# Patient Record
Sex: Female | Born: 1970 | Race: Black or African American | Hispanic: No | Marital: Married | Smoking: Former smoker
Health system: Southern US, Community
[De-identification: ages and names within clinical notes are randomized; demographics above are authoritative.]

## PROBLEM LIST (undated history)

## (undated) DIAGNOSIS — M21379 Foot drop, unspecified foot: Secondary | ICD-10-CM

## (undated) DIAGNOSIS — N83209 Unspecified ovarian cyst, unspecified side: Secondary | ICD-10-CM

## (undated) HISTORY — DX: Foot drop, unspecified foot: M21.379

## (undated) HISTORY — PX: MINOR CARPAL TUNNEL: SHX6472

## (undated) HISTORY — PX: KNEE SURGERY: SHX244

---

## 1998-09-11 ENCOUNTER — Other Ambulatory Visit: Admission: RE | Admit: 1998-09-11 | Discharge: 1998-09-11 | Payer: Self-pay | Admitting: *Deleted

## 1999-08-16 ENCOUNTER — Emergency Department (HOSPITAL_COMMUNITY): Admission: EM | Admit: 1999-08-16 | Discharge: 1999-08-16 | Payer: Self-pay | Admitting: Emergency Medicine

## 1999-08-16 ENCOUNTER — Encounter: Payer: Self-pay | Admitting: Emergency Medicine

## 1999-08-27 ENCOUNTER — Emergency Department (HOSPITAL_COMMUNITY): Admission: EM | Admit: 1999-08-27 | Discharge: 1999-08-28 | Payer: Self-pay | Admitting: Emergency Medicine

## 1999-09-01 ENCOUNTER — Emergency Department (HOSPITAL_COMMUNITY): Admission: EM | Admit: 1999-09-01 | Discharge: 1999-09-01 | Payer: Self-pay | Admitting: Emergency Medicine

## 2000-01-26 ENCOUNTER — Other Ambulatory Visit: Admission: RE | Admit: 2000-01-26 | Discharge: 2000-01-26 | Payer: Self-pay | Admitting: *Deleted

## 2003-02-07 ENCOUNTER — Other Ambulatory Visit: Admission: RE | Admit: 2003-02-07 | Discharge: 2003-02-07 | Payer: Self-pay | Admitting: *Deleted

## 2003-02-07 ENCOUNTER — Other Ambulatory Visit: Admission: RE | Admit: 2003-02-07 | Discharge: 2003-02-07 | Payer: Self-pay | Admitting: Family Medicine

## 2003-07-15 ENCOUNTER — Encounter: Admission: RE | Admit: 2003-07-15 | Discharge: 2003-09-16 | Payer: Self-pay | Admitting: Family Medicine

## 2003-07-22 ENCOUNTER — Encounter: Admission: RE | Admit: 2003-07-22 | Discharge: 2003-10-20 | Payer: Self-pay | Admitting: Family Medicine

## 2003-09-20 ENCOUNTER — Encounter: Admission: RE | Admit: 2003-09-20 | Discharge: 2003-09-20 | Payer: Self-pay | Admitting: Internal Medicine

## 2003-10-15 ENCOUNTER — Other Ambulatory Visit: Admission: RE | Admit: 2003-10-15 | Discharge: 2003-10-15 | Payer: Self-pay | Admitting: Obstetrics and Gynecology

## 2004-03-10 ENCOUNTER — Inpatient Hospital Stay (HOSPITAL_COMMUNITY): Admission: AD | Admit: 2004-03-10 | Discharge: 2004-03-10 | Payer: Self-pay | Admitting: Obstetrics and Gynecology

## 2004-04-22 ENCOUNTER — Inpatient Hospital Stay (HOSPITAL_COMMUNITY): Admission: AD | Admit: 2004-04-22 | Discharge: 2004-04-26 | Payer: Self-pay | Admitting: Obstetrics and Gynecology

## 2004-04-22 ENCOUNTER — Encounter (INDEPENDENT_AMBULATORY_CARE_PROVIDER_SITE_OTHER): Payer: Self-pay | Admitting: *Deleted

## 2004-04-27 ENCOUNTER — Encounter: Admission: RE | Admit: 2004-04-27 | Discharge: 2004-05-27 | Payer: Self-pay | Admitting: Obstetrics and Gynecology

## 2004-05-28 ENCOUNTER — Encounter: Admission: RE | Admit: 2004-05-28 | Discharge: 2004-06-27 | Payer: Self-pay | Admitting: Obstetrics and Gynecology

## 2004-07-28 ENCOUNTER — Encounter: Admission: RE | Admit: 2004-07-28 | Discharge: 2004-08-27 | Payer: Self-pay | Admitting: Obstetrics and Gynecology

## 2005-02-11 ENCOUNTER — Other Ambulatory Visit: Admission: RE | Admit: 2005-02-11 | Discharge: 2005-02-11 | Payer: Self-pay | Admitting: Family Medicine

## 2005-10-01 ENCOUNTER — Ambulatory Visit: Payer: Self-pay | Admitting: Oncology

## 2006-03-14 ENCOUNTER — Other Ambulatory Visit: Admission: RE | Admit: 2006-03-14 | Discharge: 2006-03-14 | Payer: Self-pay | Admitting: Obstetrics and Gynecology

## 2007-03-17 ENCOUNTER — Ambulatory Visit (HOSPITAL_COMMUNITY): Admission: RE | Admit: 2007-03-17 | Discharge: 2007-03-17 | Payer: Self-pay | Admitting: Obstetrics and Gynecology

## 2007-03-17 IMAGING — US US FETAL BPP W/O NONSTRESS
2 series · 14 of 26 positions shown · non-contrast
Comparison: none

OBSTETRICAL ULTRASOUND:

 This ultrasound exam was performed in the [HOSPITAL] Ultrasound Department.  The OB US report was generated in the AS system, and faxed to the ordering physician.  This report is also available in [REDACTED] PACS.

[Series 1: us fetal bpp w/o nonstress · non-contrast · 12 of 23 slices shown (1 of 2)]
[im 1/23]
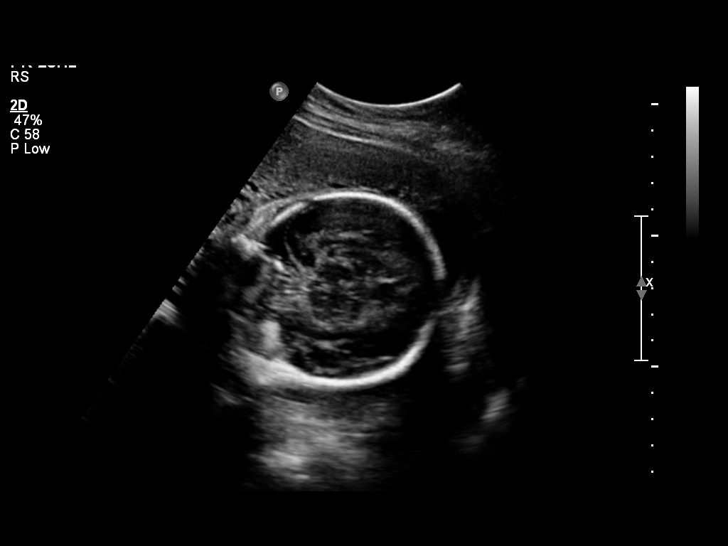
[im 3/23]
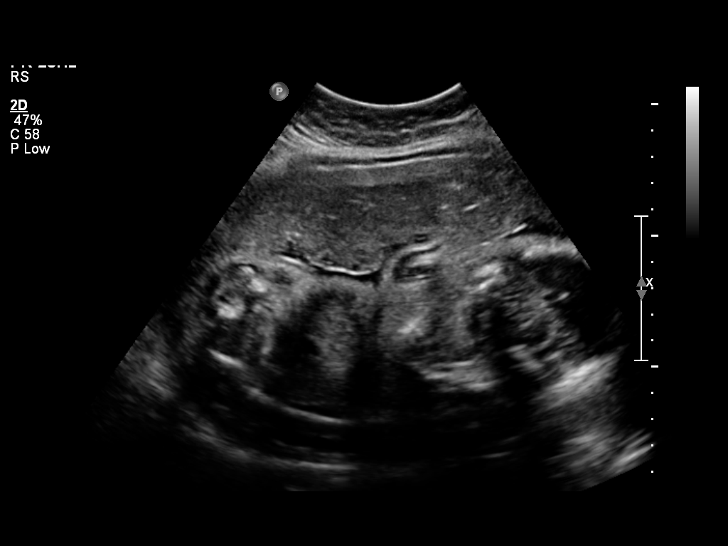
[im 5/23]
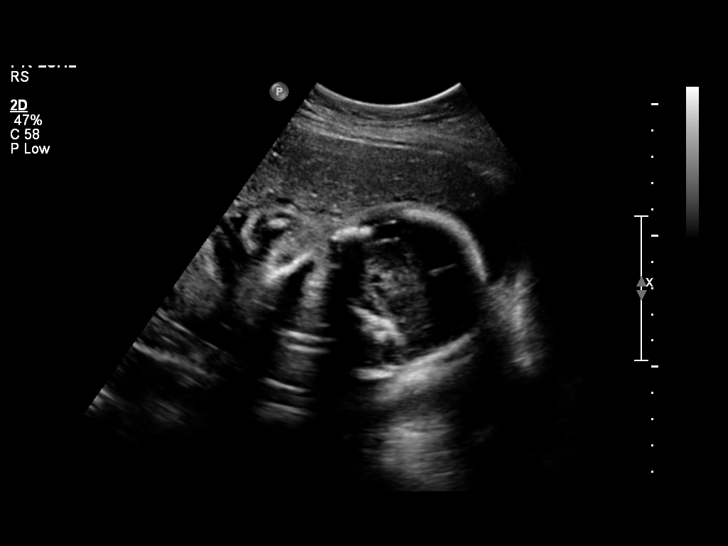
[im 7/23]
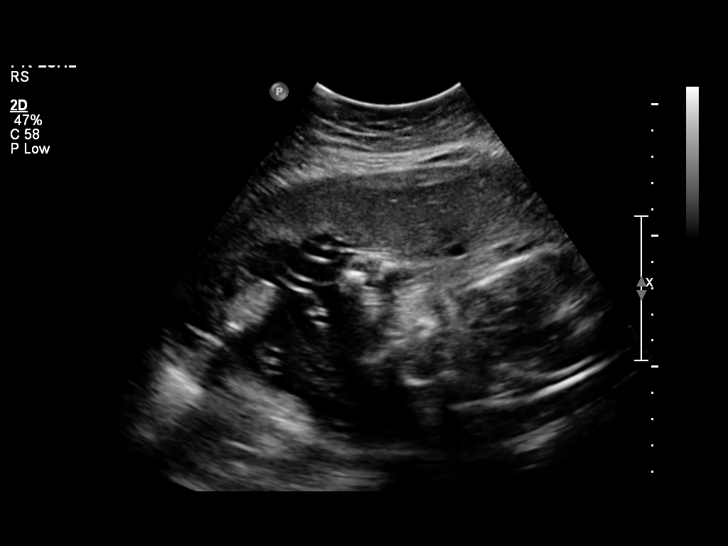
[im 9/23]
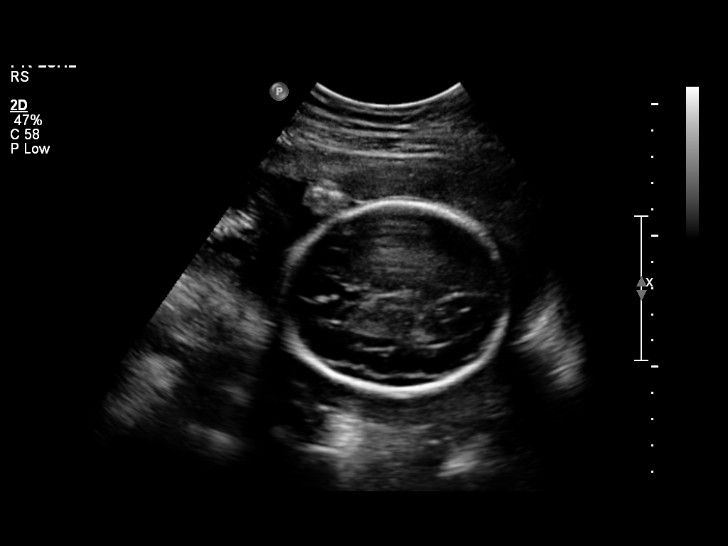
[im 11/23]
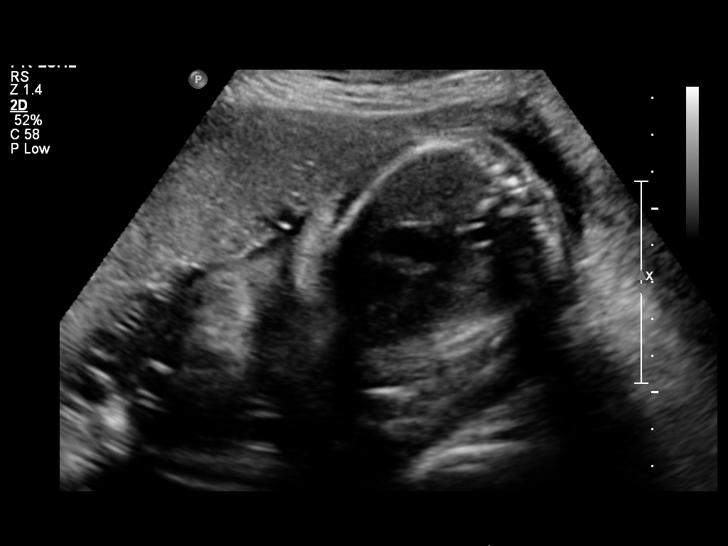
[im 13/23]
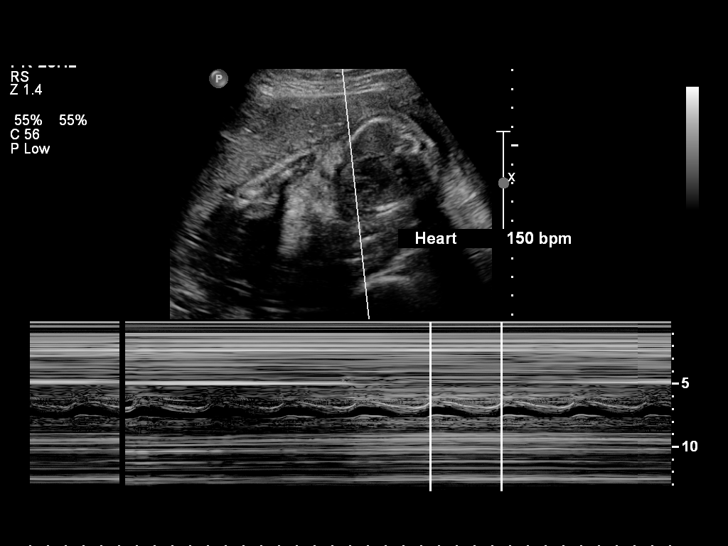
[im 14/23]
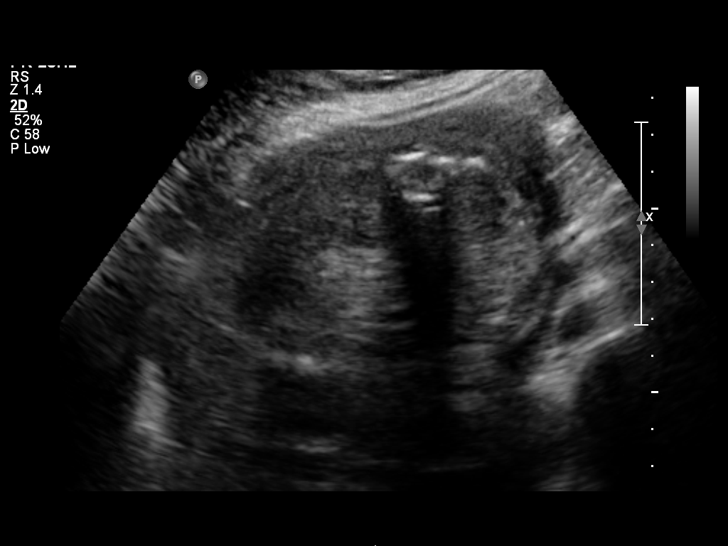
[im 16/23]
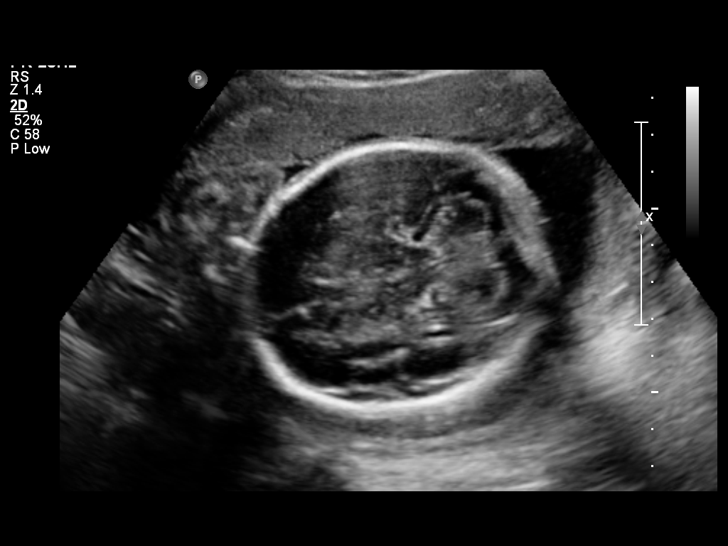
[im 18/23]
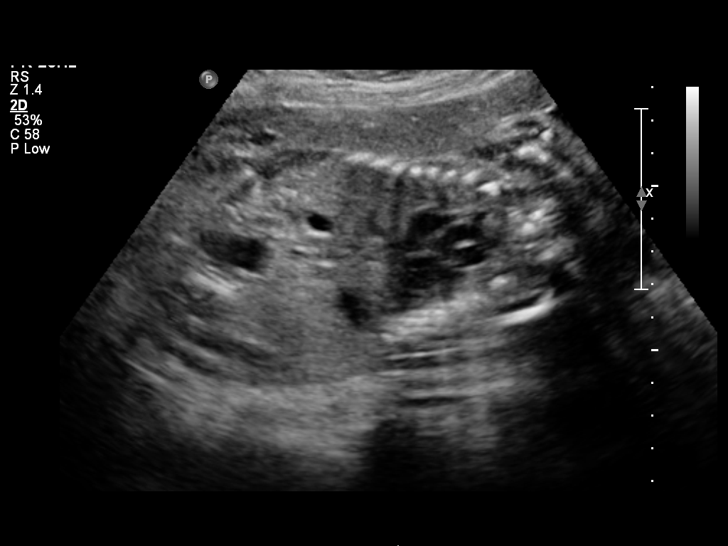
[im 20/23]
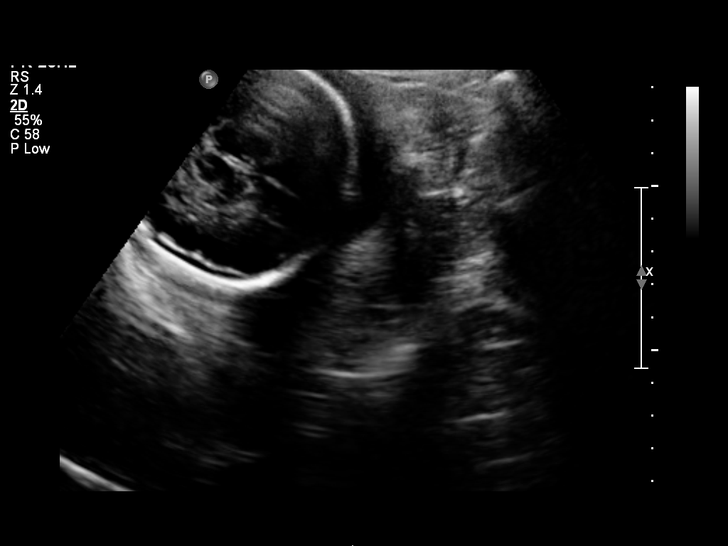
[im 22/23]
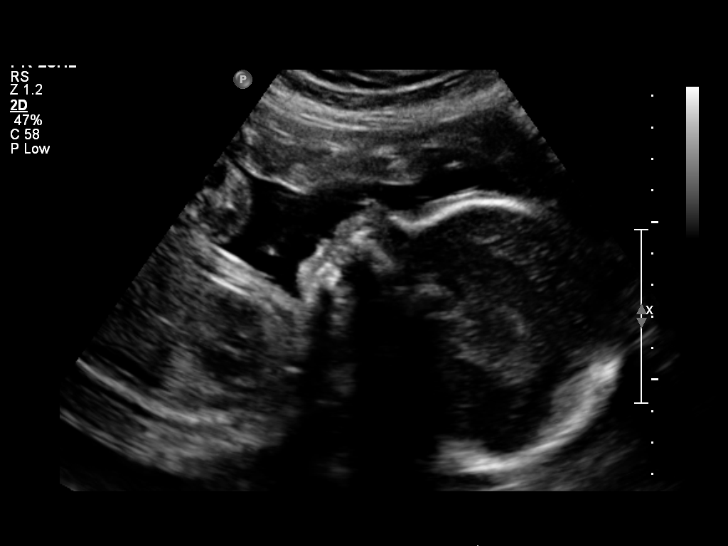

[Series 1: us fetal bpp w/o nonstress · non-contrast · 2 of 3 slices shown (2 of 2)]
[im 1/3]
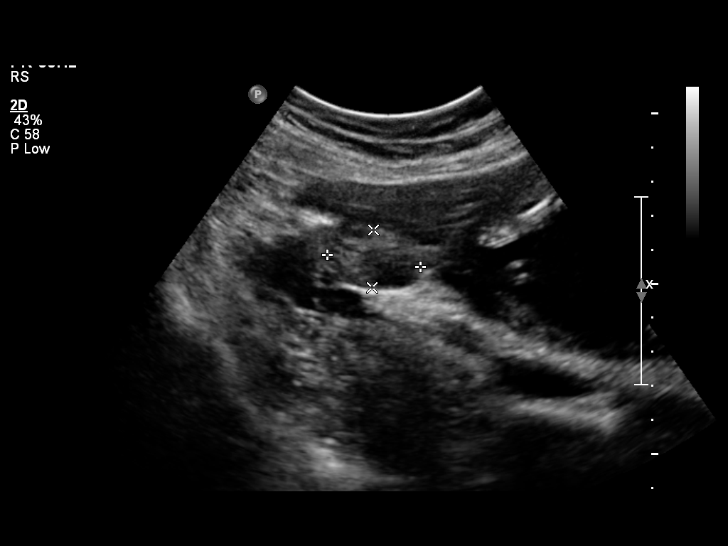
[im 3/3]
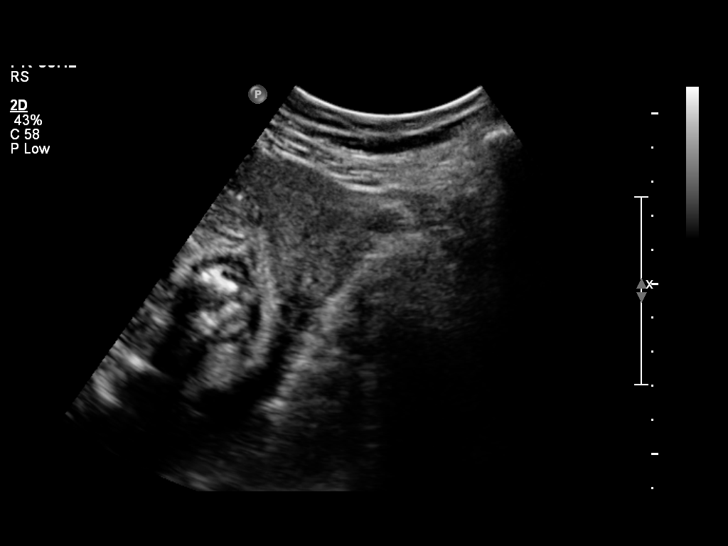

[14 of 26 positions shown; findings below may reference images not displayed]

IMPRESSION: See AS Obstetric US report.

## 2007-05-15 ENCOUNTER — Inpatient Hospital Stay (HOSPITAL_COMMUNITY): Admission: AD | Admit: 2007-05-15 | Discharge: 2007-05-16 | Payer: Self-pay | Admitting: Obstetrics and Gynecology

## 2007-05-16 IMAGING — US US OB LIMITED
1 series · 14 of 21 positions shown · non-contrast
Comparison: none

OBSTETRICAL ULTRASOUND:
 This ultrasound exam was performed in the [HOSPITAL] Ultrasound Department.  The OB US report was generated in the AS system, and faxed to the ordering physician.  This report is also available in [REDACTED] PACS.

[Series 1: us ob limited · 0.31mm/px · 21 acquisitions, 14 frames shown]
[im 1/21]
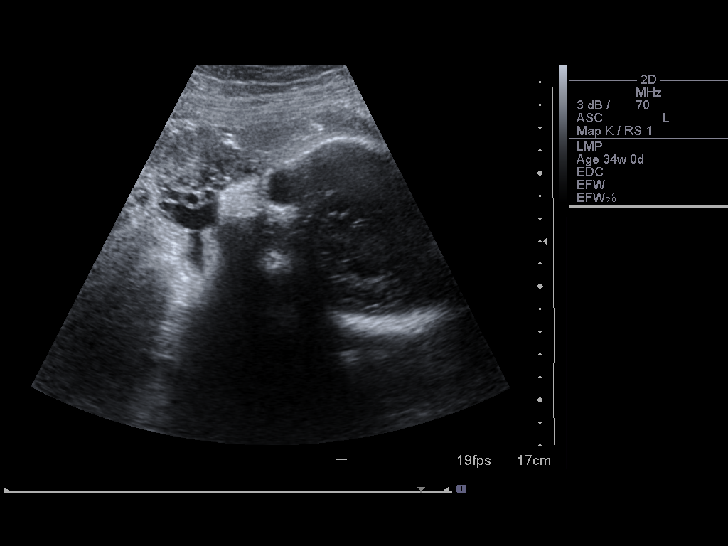
[im 3/21]
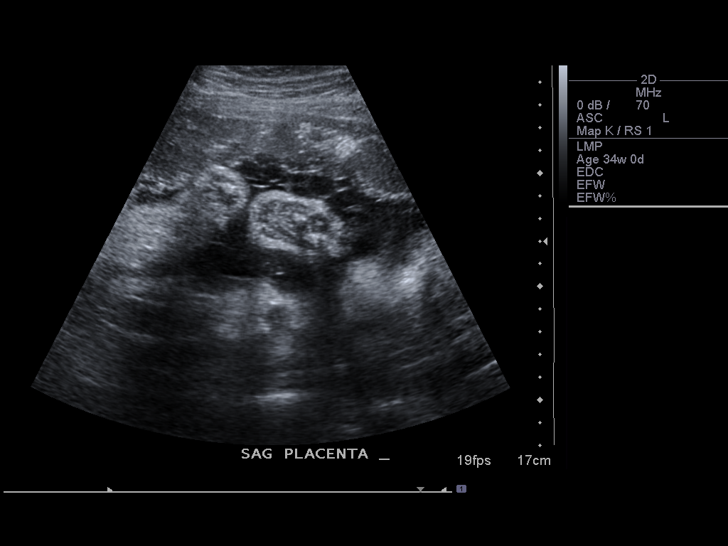
[im 4/21]
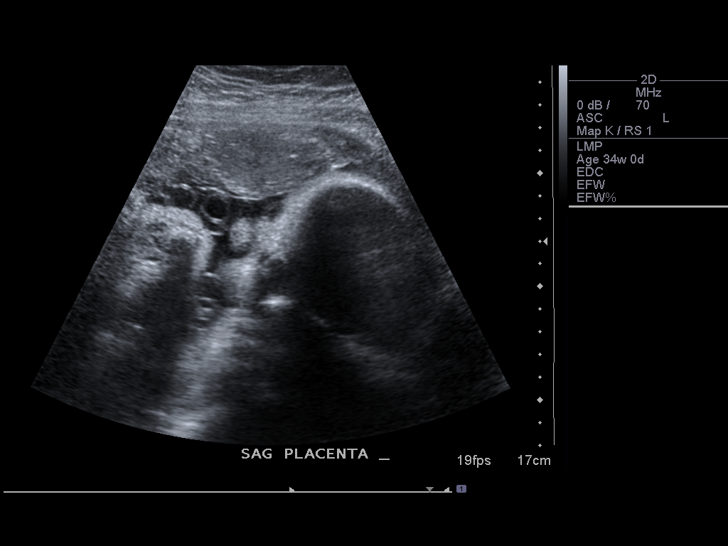
[im 6/21]
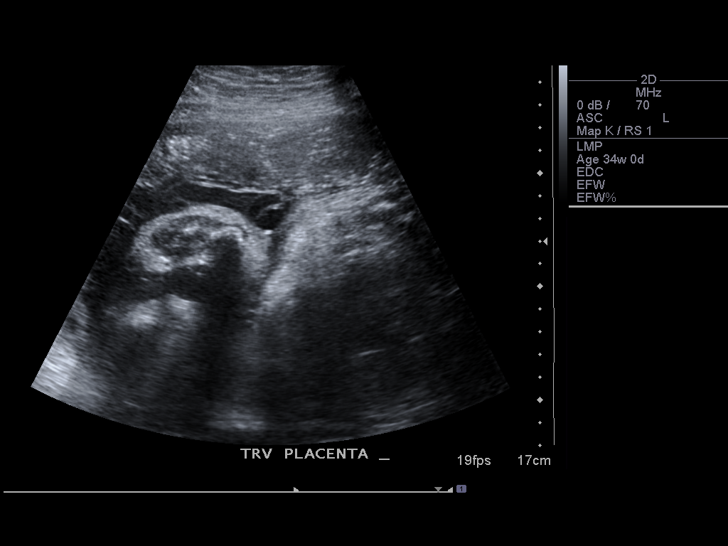
[im 7/21]
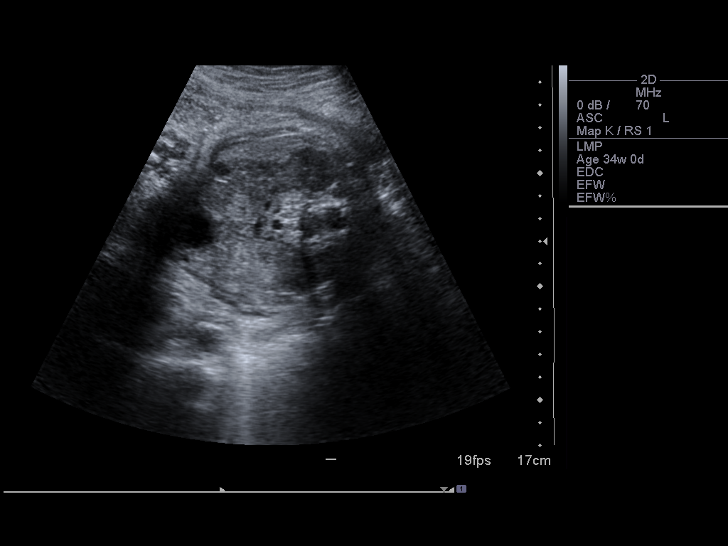
[im 9/21]
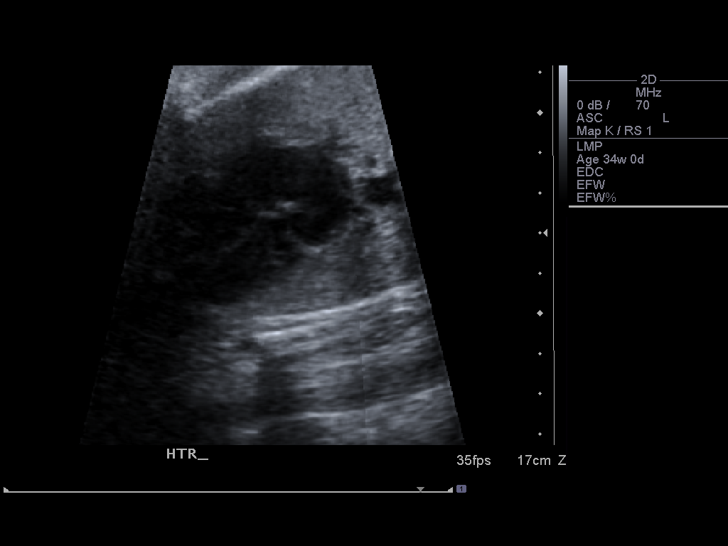
[im 10/21]
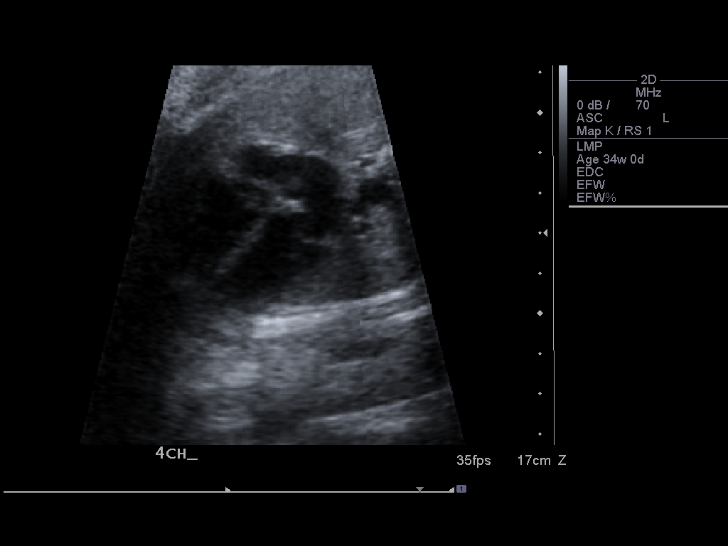
[im 12/21]
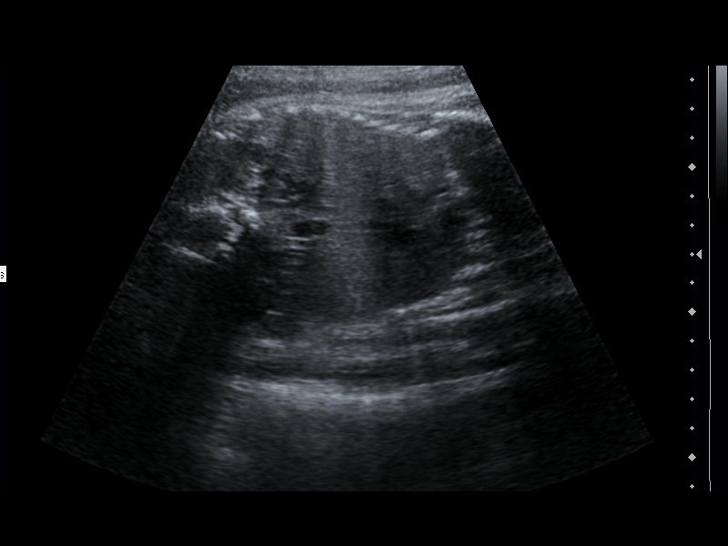
[im 13/21]
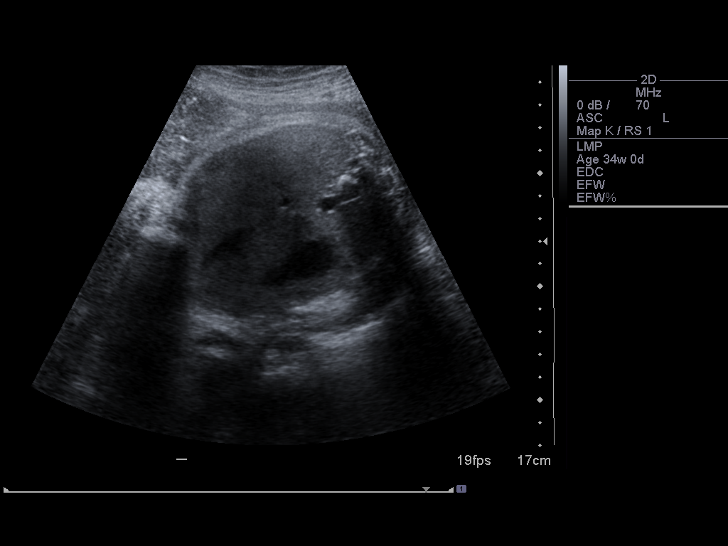
[im 15/21]
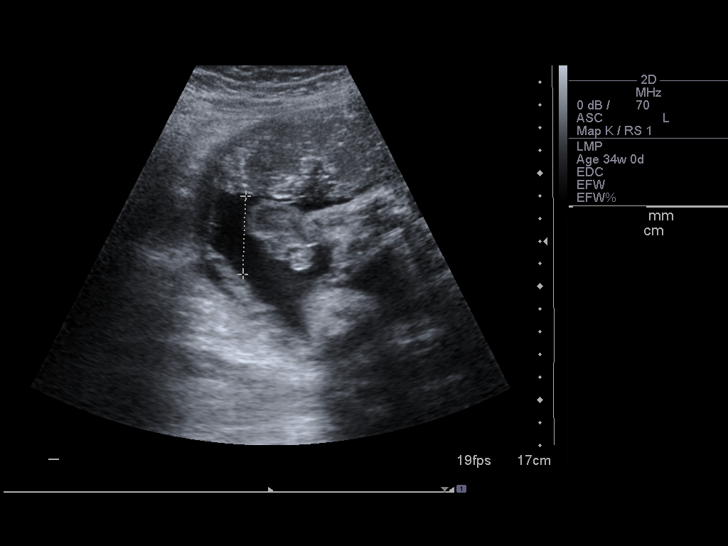
[im 16/21]
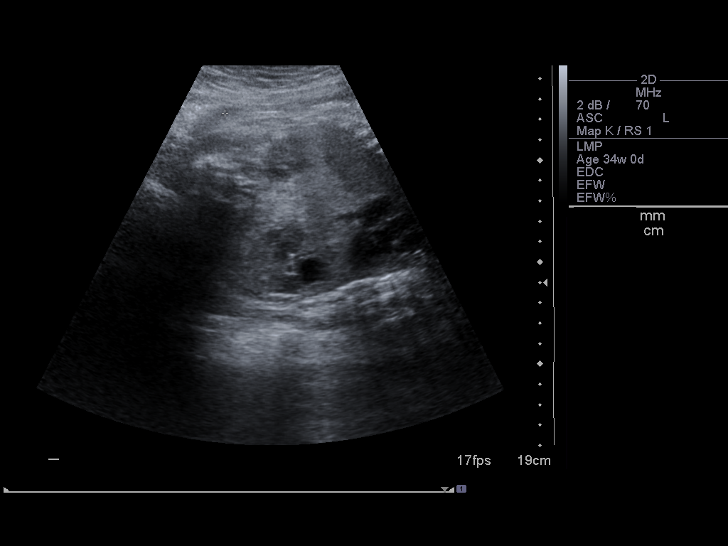
[im 18/21]
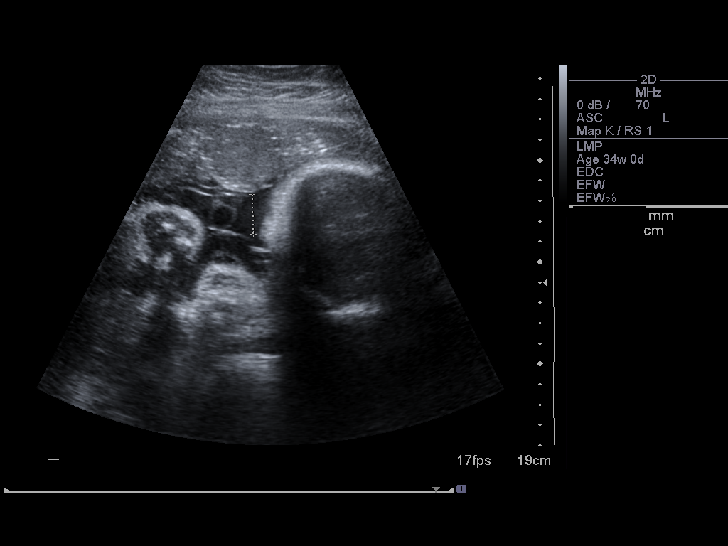
[im 19/21]
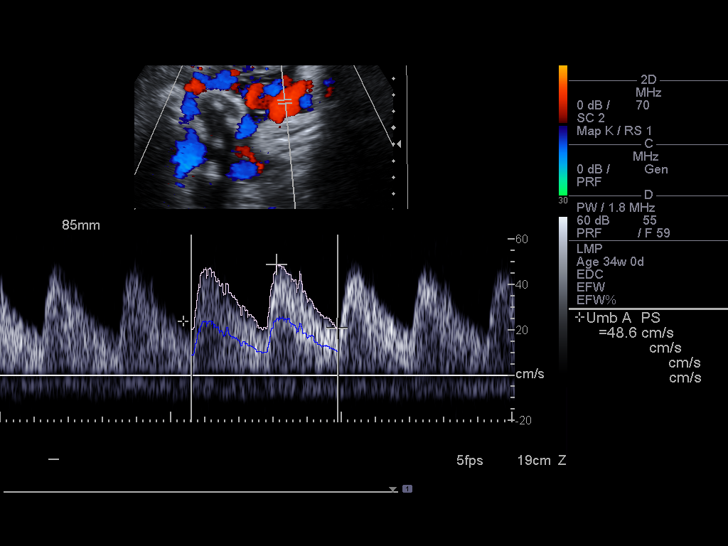
[im 21/21]
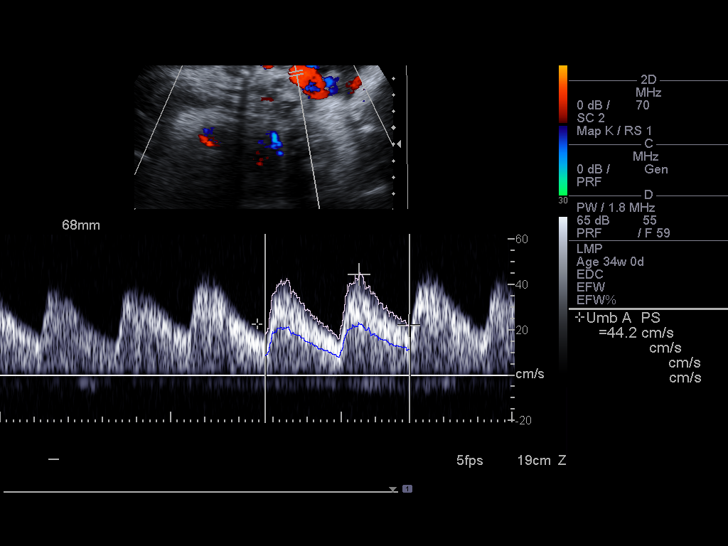

[14 of 21 positions shown; findings below may reference images not displayed]

IMPRESSION: See AS Obstetric US report.

## 2007-05-19 ENCOUNTER — Ambulatory Visit (HOSPITAL_COMMUNITY): Admission: RE | Admit: 2007-05-19 | Discharge: 2007-05-19 | Payer: Self-pay | Admitting: Obstetrics and Gynecology

## 2007-05-19 IMAGING — US US OB LIMITED
1 series · 14 of 26 positions shown · non-contrast
Comparison: none

OBSTETRICAL ULTRASOUND:
 This ultrasound exam was performed in the [HOSPITAL] Ultrasound Department.  The OB US report was generated in the AS system, and faxed to the ordering physician.  This report is also available in [REDACTED] PACS.

[Series 1: us ob limited · 0.30mm/px · 26 acquisitions, 14 frames shown]
[im 1/26]
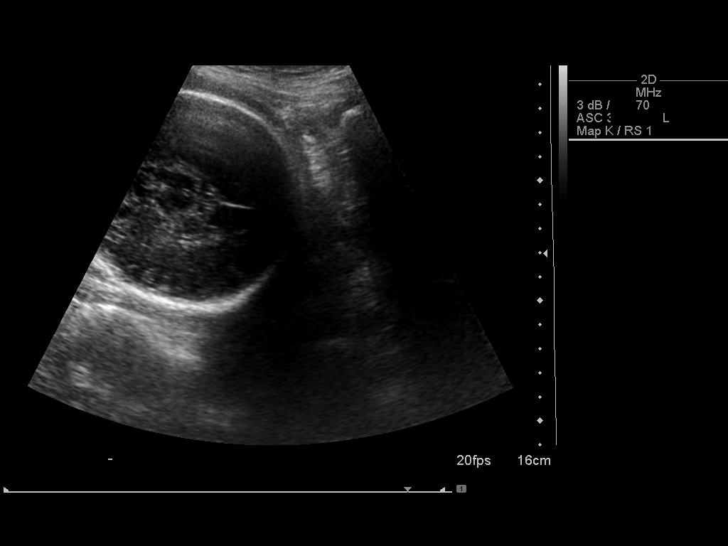
[im 3/26]
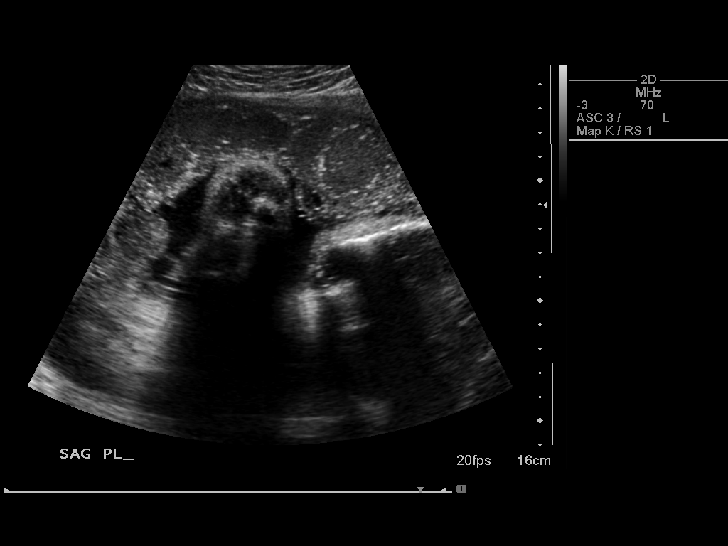
[im 5/26]
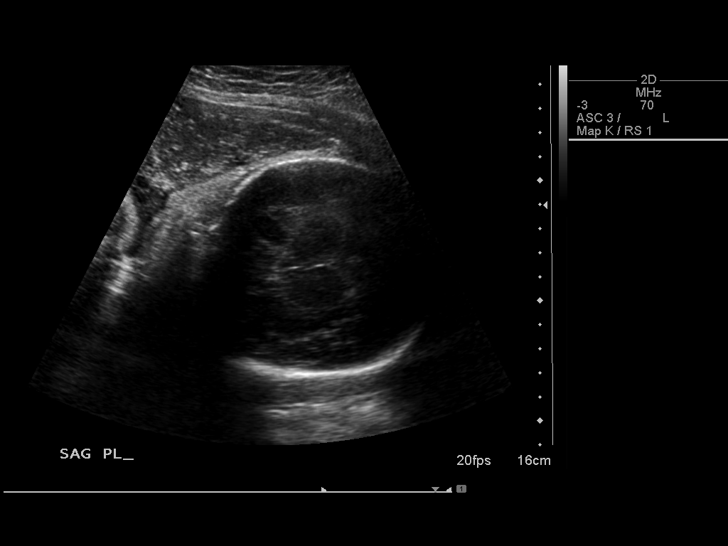
[im 7/26]
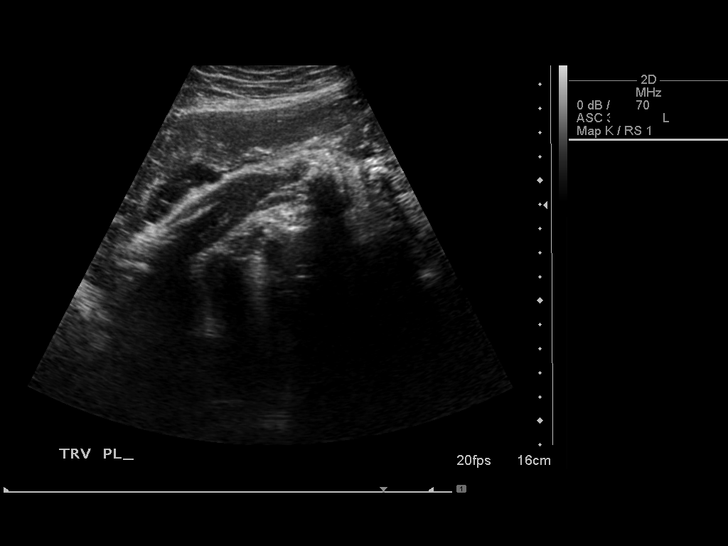
[im 9/26]
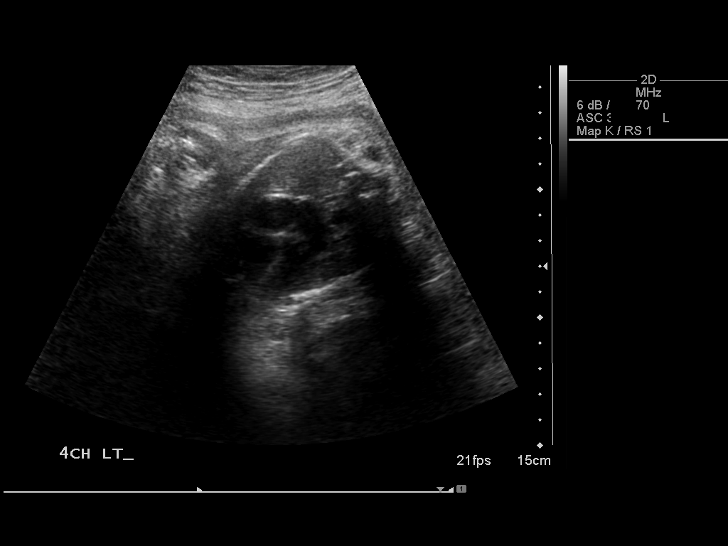
[im 11/26]
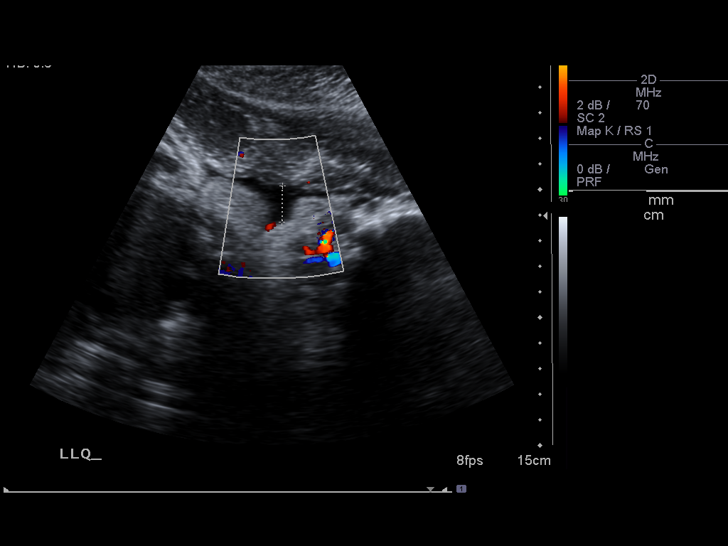
[im 13/26]
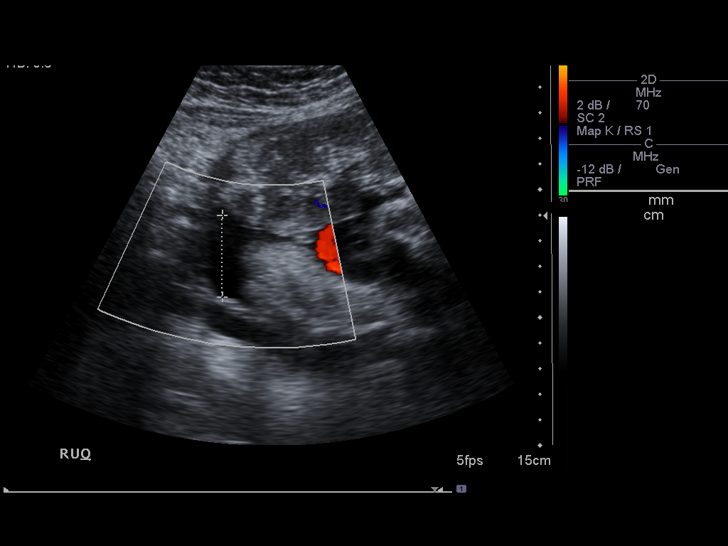
[im 14/26]
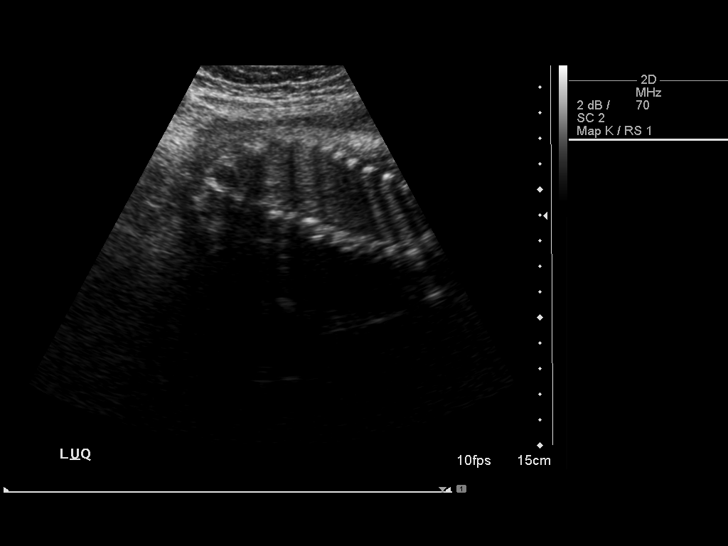
[im 16/26]
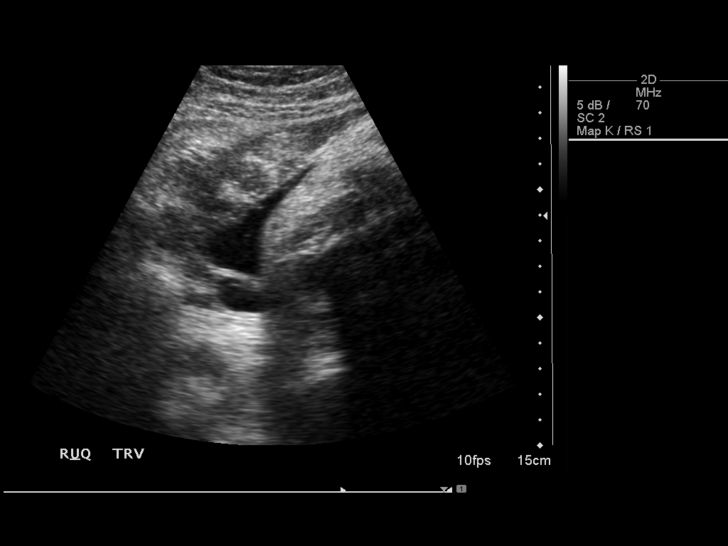
[im 18/26]
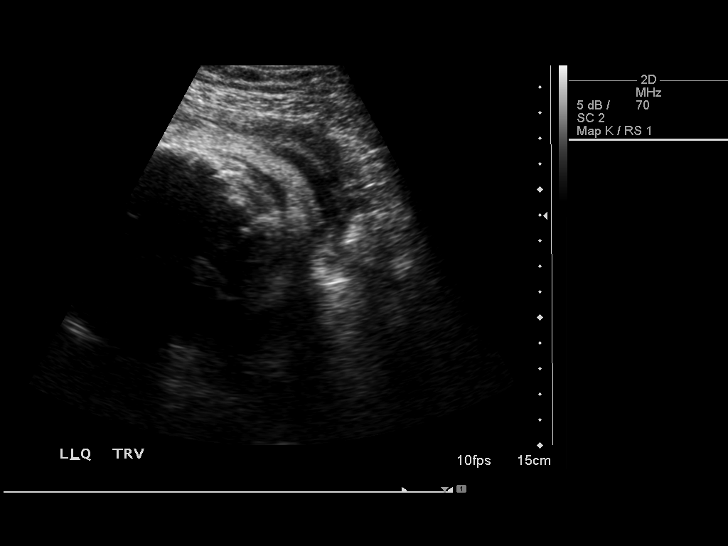
[im 20/26]
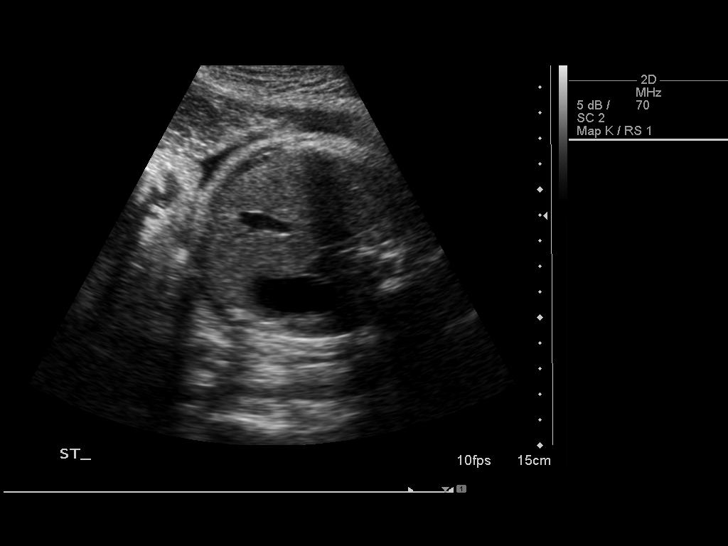
[im 22/26]
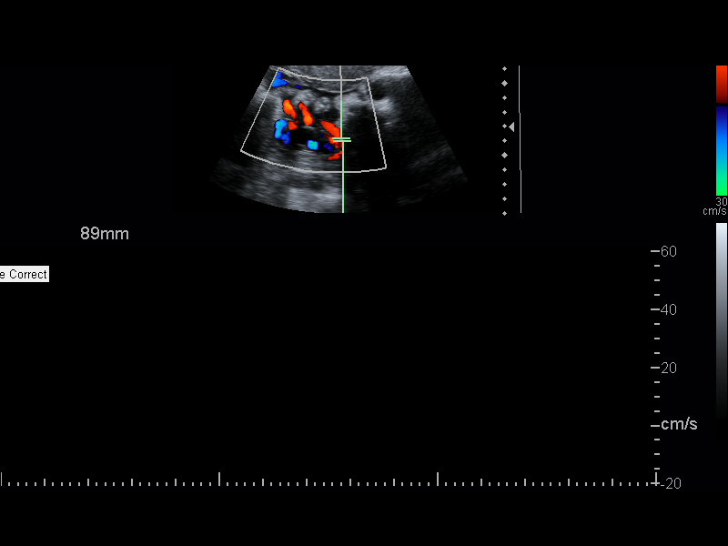
[im 24/26]
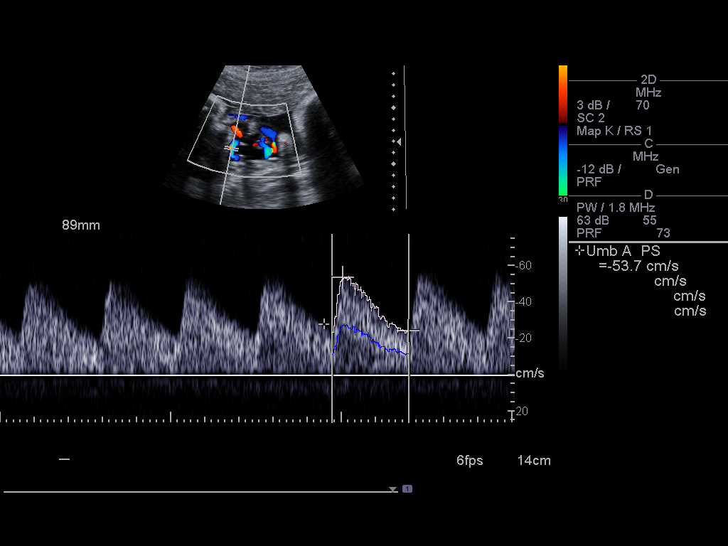
[im 26/26]
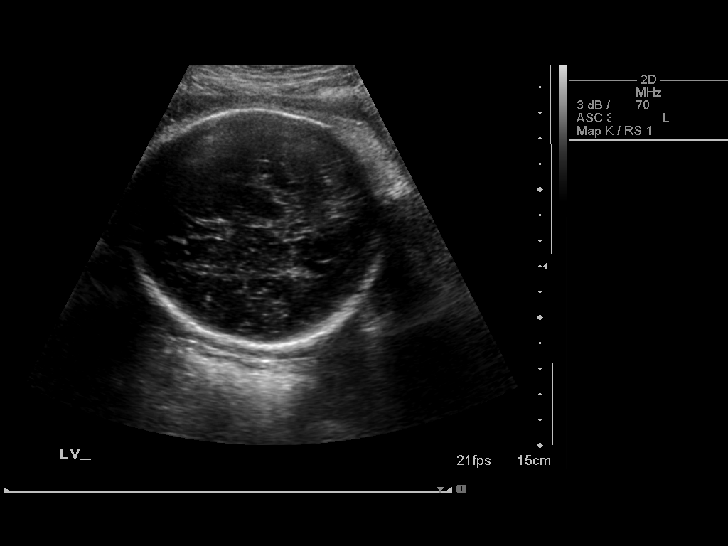

[14 of 26 positions shown; findings below may reference images not displayed]

IMPRESSION: See AS Obstetric US report.

## 2007-05-23 ENCOUNTER — Inpatient Hospital Stay (HOSPITAL_COMMUNITY): Admission: AD | Admit: 2007-05-23 | Discharge: 2007-05-26 | Payer: Self-pay | Admitting: Obstetrics and Gynecology

## 2007-05-27 ENCOUNTER — Encounter: Admission: RE | Admit: 2007-05-27 | Discharge: 2007-06-26 | Payer: Self-pay | Admitting: Obstetrics and Gynecology

## 2007-09-20 ENCOUNTER — Other Ambulatory Visit: Admission: RE | Admit: 2007-09-20 | Discharge: 2007-09-20 | Payer: Self-pay | Admitting: Obstetrics and Gynecology

## 2009-02-11 ENCOUNTER — Other Ambulatory Visit: Admission: RE | Admit: 2009-02-11 | Discharge: 2009-02-11 | Payer: Self-pay | Admitting: Obstetrics and Gynecology

## 2009-04-10 ENCOUNTER — Inpatient Hospital Stay (HOSPITAL_COMMUNITY): Admission: AD | Admit: 2009-04-10 | Discharge: 2009-04-11 | Payer: Self-pay | Admitting: Obstetrics and Gynecology

## 2009-11-01 ENCOUNTER — Inpatient Hospital Stay (HOSPITAL_COMMUNITY): Admission: AD | Admit: 2009-11-01 | Discharge: 2009-11-03 | Payer: Self-pay | Admitting: Obstetrics and Gynecology

## 2010-03-01 ENCOUNTER — Encounter: Payer: Self-pay | Admitting: Obstetrics and Gynecology

## 2010-04-23 LAB — CBC
HCT: 30.6 % — ABNORMAL LOW (ref 36.0–46.0)
Hemoglobin: 10.5 g/dL — ABNORMAL LOW (ref 12.0–15.0)
Hemoglobin: 11.2 g/dL — ABNORMAL LOW (ref 12.0–15.0)
MCH: 32.1 pg (ref 26.0–34.0)
MCH: 32.5 pg (ref 26.0–34.0)
MCHC: 34.4 g/dL (ref 30.0–36.0)
MCV: 94.3 fL (ref 78.0–100.0)
Platelets: 262 10*3/uL (ref 150–400)
RBC: 3.5 MIL/uL — ABNORMAL LOW (ref 3.87–5.11)

## 2010-05-03 LAB — CBC
HCT: 32.5 % — ABNORMAL LOW (ref 36.0–46.0)
Hemoglobin: 11.2 g/dL — ABNORMAL LOW (ref 12.0–15.0)
MCHC: 34.4 g/dL (ref 30.0–36.0)
MCV: 91 fL (ref 78.0–100.0)
Platelets: 334 10*3/uL (ref 150–400)
RBC: 3.57 MIL/uL — ABNORMAL LOW (ref 3.87–5.11)
RDW: 11.7 % (ref 11.5–15.5)
WBC: 5.9 10*3/uL (ref 4.0–10.5)

## 2010-05-03 LAB — COMPREHENSIVE METABOLIC PANEL
ALT: 14 U/L (ref 0–35)
AST: 13 U/L (ref 0–37)
Albumin: 3.2 g/dL — ABNORMAL LOW (ref 3.5–5.2)
Alkaline Phosphatase: 51 U/L (ref 39–117)
BUN: 2 mg/dL — ABNORMAL LOW (ref 6–23)
CO2: 25 mEq/L (ref 19–32)
Calcium: 9.1 mg/dL (ref 8.4–10.5)
Chloride: 100 mEq/L (ref 96–112)
Creatinine, Ser: 0.41 mg/dL (ref 0.4–1.2)
GFR calc Af Amer: >60 mL/min (ref >60)
GFR calc non Af Amer: >60 mL/min (ref >60)
Glucose, Bld: 84 mg/dL (ref 70–99)
Potassium: 3.5 mEq/L (ref 3.5–5.1)
Sodium: 132 mEq/L — ABNORMAL LOW (ref 135–145)
Total Bilirubin: 0.1 mg/dL — ABNORMAL LOW (ref 0.3–1.2)
Total Protein: 6.6 g/dL (ref 6.0–8.3)

## 2010-05-03 LAB — URINALYSIS, ROUTINE W REFLEX MICROSCOPIC
Bilirubin Urine: NEGATIVE
Glucose, UA: NEGATIVE mg/dL
Hgb urine dipstick: NEGATIVE
Ketones, ur: NEGATIVE mg/dL
Nitrite: NEGATIVE
Protein, ur: NEGATIVE mg/dL
Specific Gravity, Urine: 1.02 (ref 1.005–1.030)
Urobilinogen, UA: 0.2 mg/dL (ref 0.0–1.0)
pH: 6 (ref 5.0–8.0)

## 2010-06-23 NOTE — Discharge Summary (Signed)
Lauren Levy, Lauren Levy                ACCOUNT NO.:  [id number]   MEDICAL RECORD NO.:  [id number]          PATIENT TYPE:  INP   LOCATION:  9162                          FACILITY:  [facility]   PHYSICIAN:  Charles A. Delcambre, MDDATE OF BIRTH:  February 18, 1970   DATE OF ADMISSION:  05/23/2007  DATE OF DISCHARGE:                               DISCHARGE SUMMARY   A 40 year old gravida 3, para1-0-1-1 at 88 weeks and 3 days, EDC on Jun 17, 2007, based on LMP consistent with a 19-week ultrasound.   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 and q.i.d. at this  pregnancy with resolution of ptyalism.  She, otherwise, has had a  routine prenatal course aside from a close vigilant watch for term,  although developing at 36-37 weeks.  She did develop this and was  induced last pregnancy with a successful vaginal birth.   PAST MEDICAL HISTORY:  Ptyalism.   MEDICATIONS:  1. Prenatal vitamins.  2. Iron.  3. Thorazine 25 mg t.i.d. and q.i.d.  4. Tandem OB.  5. Reglan 10 mg t.i.d.  6. Iron once a day.   PAST SURGICAL HISTORY:  Wrist surgery, vaginal birth x1, and spontaneous  abortion without D&C.   ALLERGIES:  No known drug allergies.  No latex allergy.   SOCIAL HISTORY:  No tobacco or ethanol or drugs.  She is married,  monogamous relationship with her husband.   FAMILY HISTORY:  Noncontributory.   LABORATORY DATA:  A 1-hour glucola 94, refused quad screen, refused  first trimester screen.  Hemoglobin 10.7 at 28 weeks.  The patient  refused RPR and HIV and had not gotten group B strep done yet.  She had  not gotten group B strep, but it was done at the hospital upon last  admission last week.  Cystic fibrosis negative.  HIV negative.  Hepatitis B surface antigen negative.  Rubella immune, VDRL nonreactive.  Sickle trait negative.  Antibody screen negative.  Type AB positive.  TSH was normal.  Pap was negative.  GC and Chlamydia  were negative.   PHYSICAL EXAMINATION:  GENERAL:  Alert and oriented x3, in no distress.  VITAL SIGNS:  Blood pressure 120/70, weight 176 pounds, heart rate 80,  and respirations 20.  HEENT EXAM:  Grossly within normal limits.  NECK:  Supple without thyromegaly or adenopathy.  LUNGS:  Clear bilaterally.  HEART:  Regular rate and rhythm without murmur, rub, or gallop.  BREASTS:  No masses, discharge, tenderness, skin or nipple changes  bilaterally.  ABDOMEN:  Gravid.  Fundal height 35 cm.  Fetal heart rate 140s.  Ultrasound today 5.5-cm oligohydramnios down from 6.3 to 6.1 to 6.0, now  5.5 cm, no evidence of ruptured membranes.  CERVIX:  Posterior 1.5 cm, 50% effaced, -3 station, ballotable, intact.  EXTREMITIES:  Minimal edema bilaterally.   ASSESSMENT:  1. Intrauterine pregnancy at 36 weeks and 3 days today,      oligohydramnios at 5.5 cm, AFI drifting down from 6.2 cm last week.  2. Advanced maternal age.  3. Ptyalism.  4. Anemia.   PLAN:  Admit for Cytotec 25 mcg q.4 h.  If unable to give Cytotec, I  will change to Pitocin 2-4 mL milliIU/minute overnight and then increase  per protocol in the morning.  If she is not in labor tonight or in the  morning, go ahead and allow her to eat.  I will get group B strep result  from the hospital onto chart; otherwise, than locating it, we will treat  as positive with penicillin.  She agrees with plan and we will proceed  as outlined.      Charles A. Sydnee Cabal, MD  Electronically Signed     CAD/MEDQ  D:  05/23/2007  T:  05/24/2007  Job:  [id number]

## 2010-06-26 NOTE — H&P (Signed)
NAMECHARITIE, HINOTE                ACCOUNT NO.:  [id number]   MEDICAL RECORD NO.:  [id number]          PATIENT TYPE:  MAT   LOCATION:  [facility]C                          FACILITY:  [facility]   PHYSICIAN:  Charles A. Delcambre, MDDATE OF BIRTH:  Oct 17, 1970   DATE OF ADMISSION:  04/22/2004  DATE OF DISCHARGE:                                HISTORY & PHYSICAL   This patient is to be admitted today secondary to borderline oligohydramnios  with an AFI of 5.7 at 37 weeks and 2 days estimated gestational age.  She is  a 40 year old para 0-0-1-0, followed recently for low amniotic fluid and now  resulting in note of AFI yesterday of 7 and falling to 5.7 today.  For this  reason, I have recommended induction and she is in agreement.  She was  reluctant yesterday, but did agree to repeat AFI today.  She notes active  fetal movement.  She denies contractions, rupture of membranes, or bleeding.  Cervical examination yesterday was soft, posterior, and closed.   PAST MEDICAL HISTORY:  Acne.  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   PAST SURGICAL HISTORY:  None.   MEDICATIONS:  Prenatal vitamins.  Phenergan and Zofran during this  pregnancy.  She has history of Benzoquinone topical category C and Retin A  topical category C discontinued during pregnancy.   ALLERGIES:  No known drug allergies.   SOCIAL HISTORY:  She has past smoked cigarettes, discontinued during the  pregnancy.  Otherwise, married.  No alcohol or drug history.  No STD history  in the past.   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  No chest  pain, shortness of breath, nausea, vomiting, diarrhea, constipation.   PRENATAL LABORATORY DATA:  Blood type AB positive, antibody screen negative,  sickle trait negative, VDRL negative, rubella immune,  hepatitis B surface  antigen negative, HIV negative, Pap negative on October 15, 2003.  GC and  Chlamydia negative.  Cystic fibrosis negative.  Quad screen negative.  Glucola 92.  Hemoglobin 11.8 and one 12.  Group B Strep positive on April 17, 2004.  Last growth ultrasound at 34 weeks 6 days; 2609 grams, 5 pounds  12 ounces, roughly 50th percentile.  Vertex on ultrasound yesterday as well  as today.   PHYSICAL EXAMINATION:  VITAL SIGNS:  From yesterday; blood pressure 110/70,  weight 176 pounds, respirations 18, afebrile.  HEENT:  Grossly within normal limits.  NECK:  Supple without thyromegaly.  LUNGS:  Clear bilaterally.  HEART:  Regular rate and rhythm with 2/6 systolic ejection murmur at left  sternal border.  BREASTS:  Symmetrical, otherwise not reexamined.  Negative examination on  October 15, 2003.  ABDOMEN:  Soft, gravid, fundal height consistent with 37 weeks.  PELVIC:  Cervix as noted above.  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  I do not see  that this should impede a vaginal delivery trial.  EXTREMITIES:  Mild edema in the lower extremities.   ASSESSMENT:  1.  Intrauterine pregnancy at 37 weeks 2 days.  2.  Borderline oligohydramnios at 5.7 cm.   PLAN:  Cervidil, low dose Pitocin once amenable.  The patient is in  agreement and will go ahead and admit at this time for continuous  monitoring.  She was in agreement.      CAD/MEDQ  D:  04/22/2004  T:  04/22/2004  Job:  [id number]

## 2010-11-03 LAB — CBC
HCT: 32.1 — ABNORMAL LOW
Hemoglobin: 11.1 — ABNORMAL LOW
Hemoglobin: 11.3 — ABNORMAL LOW
MCHC: 35.3
MCHC: 35.3
MCHC: 35.2
MCV: 90.9
MCV: 91.6
Platelets: 292
Platelets: 256
RBC: 3.46 — ABNORMAL LOW
RDW: 14.4
RDW: 14.3
WBC: 6.9

## 2010-11-03 LAB — STREP B DNA PROBE

## 2010-11-03 LAB — COMPREHENSIVE METABOLIC PANEL
ALT: 14
Alkaline Phosphatase: 133 — ABNORMAL HIGH
CO2: 23
Calcium: 9.1
Chloride: 104
GFR calc non Af Amer: >60
Glucose, Bld: 135 — ABNORMAL HIGH
Sodium: 133 — ABNORMAL LOW
Total Bilirubin: 0.6

## 2012-05-31 ENCOUNTER — Other Ambulatory Visit (HOSPITAL_COMMUNITY): Payer: Self-pay | Admitting: Obstetrics and Gynecology

## 2012-05-31 DIAGNOSIS — Z1231 Encounter for screening mammogram for malignant neoplasm of breast: Secondary | ICD-10-CM

## 2012-06-08 ENCOUNTER — Ambulatory Visit (HOSPITAL_COMMUNITY): Payer: Self-pay

## 2012-06-14 ENCOUNTER — Ambulatory Visit (HOSPITAL_COMMUNITY)
Admission: RE | Admit: 2012-06-14 | Discharge: 2012-06-14 | Disposition: A | Discharge status: Home / Self Care | Payer: Self-pay | Source: Ambulatory Visit | Attending: Obstetrics and Gynecology | Admitting: Obstetrics and Gynecology

## 2012-06-14 DIAGNOSIS — Z1231 Encounter for screening mammogram for malignant neoplasm of breast: Secondary | ICD-10-CM

## 2012-06-14 IMAGING — MG MM DIGITAL SCREENING BILAT
4 series · 4 of 4 positions shown · non-contrast
Comparison: None.

CLINICAL DATA: Screening.

DIGITAL BILATERAL SCREENING MAMMOGRAM WITH CAD

[R CC]
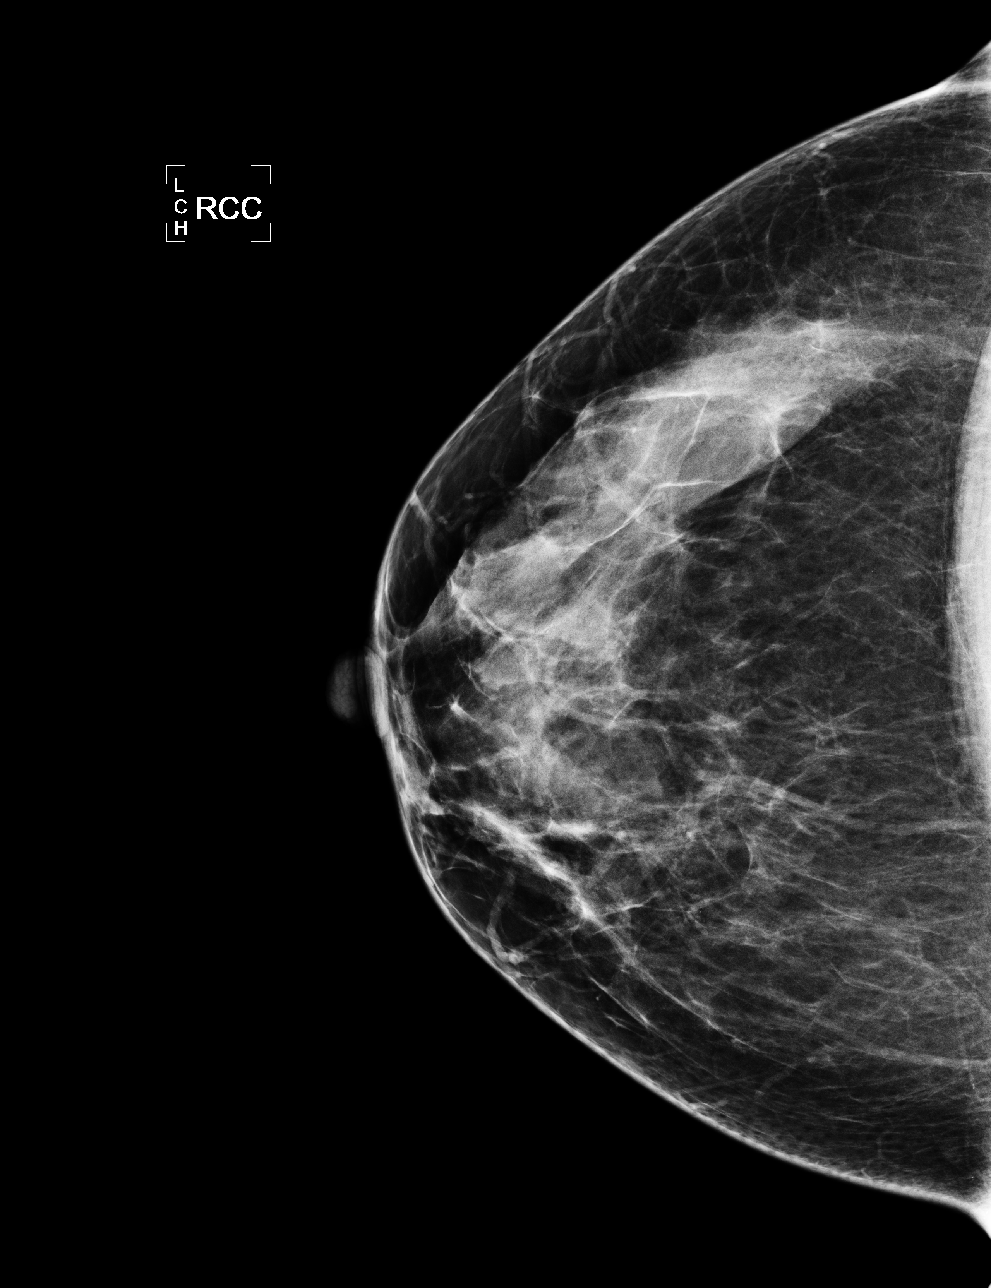

[R MLO]
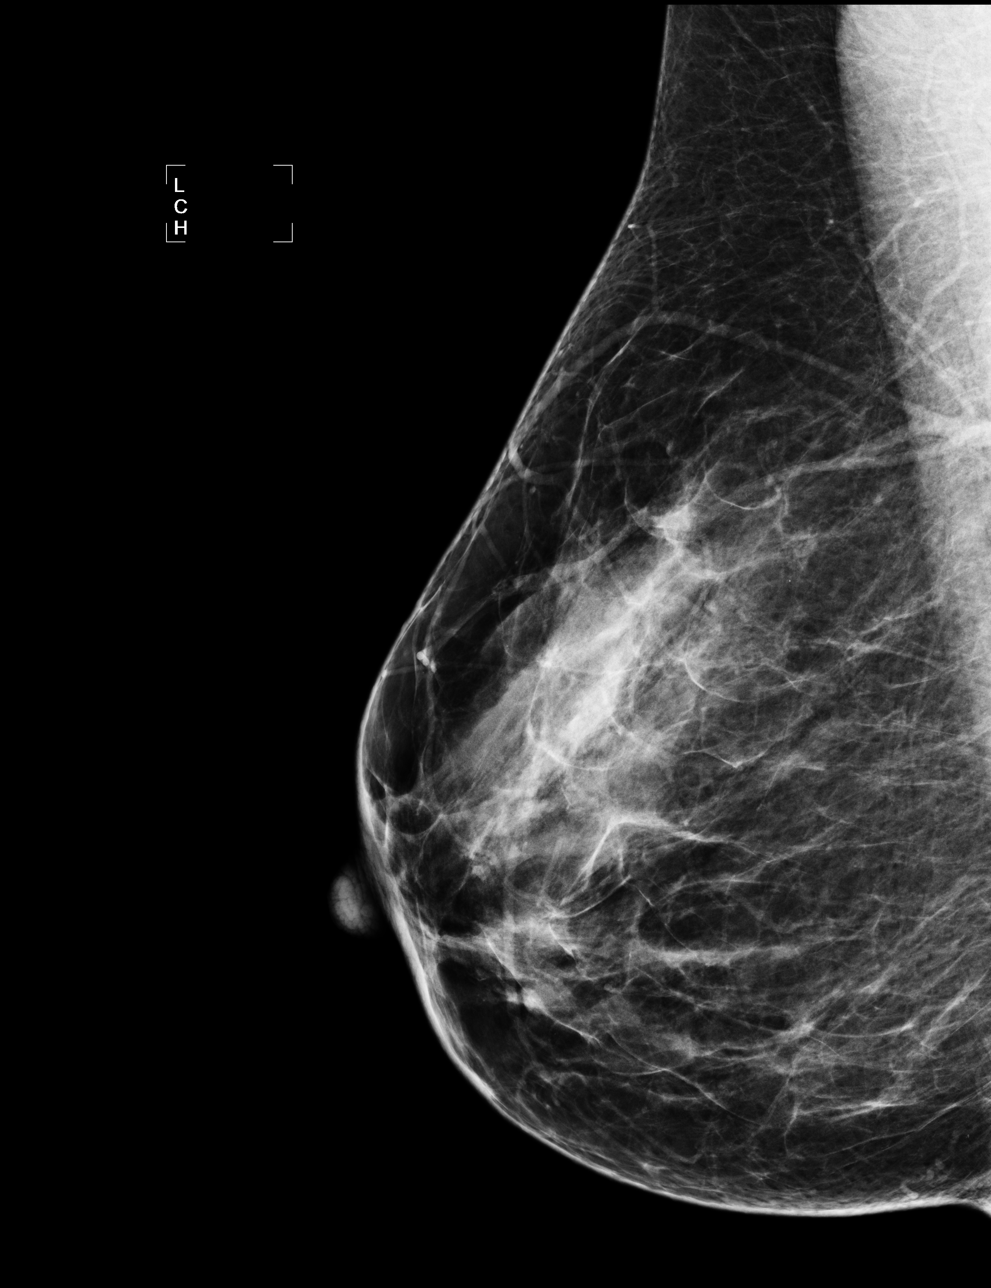

[L CC]
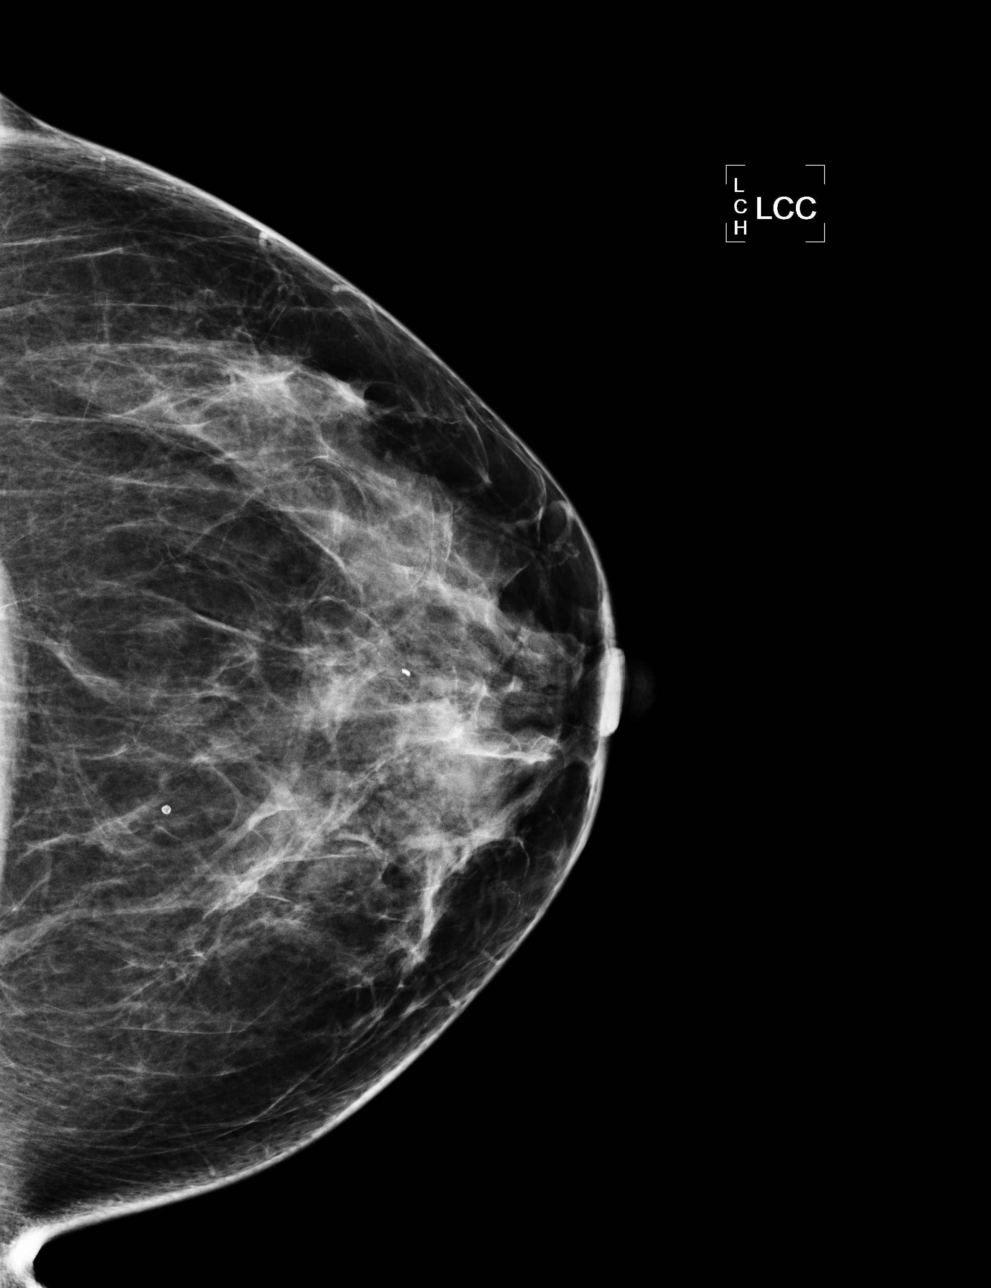

[L MLO]
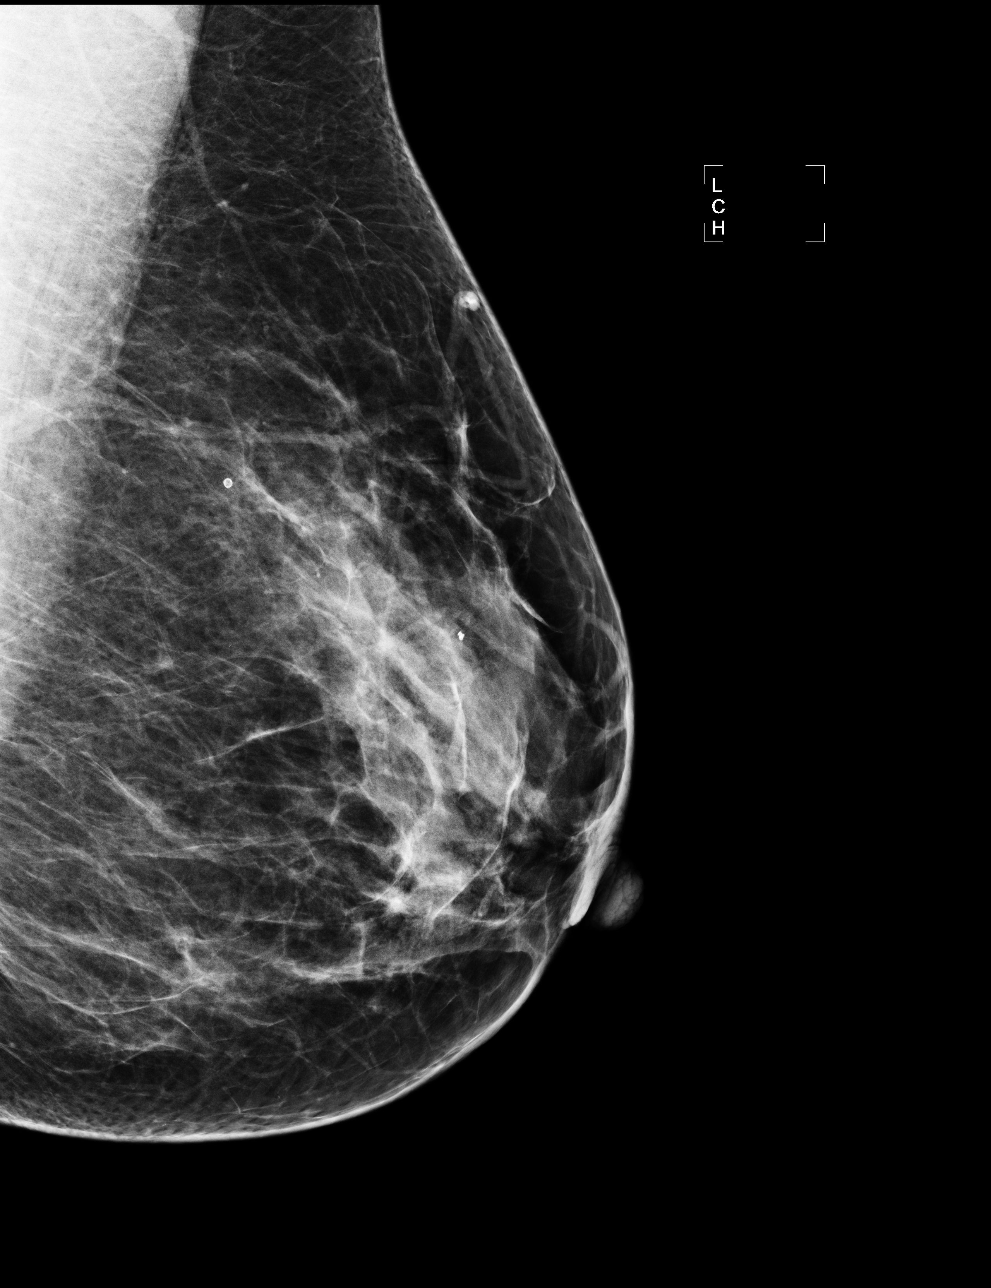

[4 of 4 positions shown; findings below may reference images not displayed]

FINDINGS: ACR Breast Density Category 3: The breast tissue is heterogeneously
dense.

No suspicious masses, architectural distortion, or calcifications
are present.

Images were processed with CAD.
IMPRESSION: No mammographic evidence of malignancy.

A result letter of this screening mammogram will be mailed directly
to the patient.

RECOMMENATION:
Screening mammogram in one year. (Code:[5O])

BI-RADS CATEGORY 1:  Negative.

## 2012-12-20 ENCOUNTER — Other Ambulatory Visit: Payer: Self-pay | Admitting: Obstetrics and Gynecology

## 2012-12-20 ENCOUNTER — Other Ambulatory Visit (HOSPITAL_COMMUNITY)
Admission: RE | Admit: 2012-12-20 | Discharge: 2012-12-20 | Disposition: A | Discharge status: Home / Self Care | Payer: Self-pay | Source: Ambulatory Visit | Attending: Obstetrics and Gynecology | Admitting: Obstetrics and Gynecology

## 2012-12-20 DIAGNOSIS — Z01419 Encounter for gynecological examination (general) (routine) without abnormal findings: Secondary | ICD-10-CM | POA: Insufficient documentation

## 2012-12-20 DIAGNOSIS — N760 Acute vaginitis: Secondary | ICD-10-CM | POA: Insufficient documentation

## 2012-12-20 DIAGNOSIS — Z1151 Encounter for screening for human papillomavirus (HPV): Secondary | ICD-10-CM | POA: Insufficient documentation

## 2013-04-23 ENCOUNTER — Other Ambulatory Visit (HOSPITAL_COMMUNITY)
Admission: RE | Admit: 2013-04-23 | Discharge: 2013-04-23 | Disposition: A | Discharge status: Home / Self Care | Payer: BC Managed Care – PPO | Source: Ambulatory Visit | Attending: Obstetrics and Gynecology | Admitting: Obstetrics and Gynecology

## 2013-04-23 ENCOUNTER — Other Ambulatory Visit: Payer: Self-pay | Admitting: Obstetrics and Gynecology

## 2013-04-23 DIAGNOSIS — Z01419 Encounter for gynecological examination (general) (routine) without abnormal findings: Secondary | ICD-10-CM | POA: Insufficient documentation

## 2013-08-09 ENCOUNTER — Ambulatory Visit
Admission: RE | Admit: 2013-08-09 | Discharge: 2013-08-09 | Disposition: A | Discharge status: Home / Self Care | Payer: BC Managed Care – PPO | Source: Ambulatory Visit | Attending: Family Medicine | Admitting: Family Medicine

## 2013-08-09 ENCOUNTER — Other Ambulatory Visit: Payer: Self-pay | Admitting: Family Medicine

## 2013-08-09 DIAGNOSIS — M5489 Other dorsalgia: Secondary | ICD-10-CM

## 2013-08-09 IMAGING — CR DG SI JOINTS 3+V
3 series · 3 of 3 positions shown · non-contrast
Comparison: None.

CLINICAL DATA: Sacral pain.  Rule out sacroiliitis

EXAM:
BILATERAL SACROILIAC JOINTS - 3+ VIEW

[t sacroiliac joints (1 of 3)]
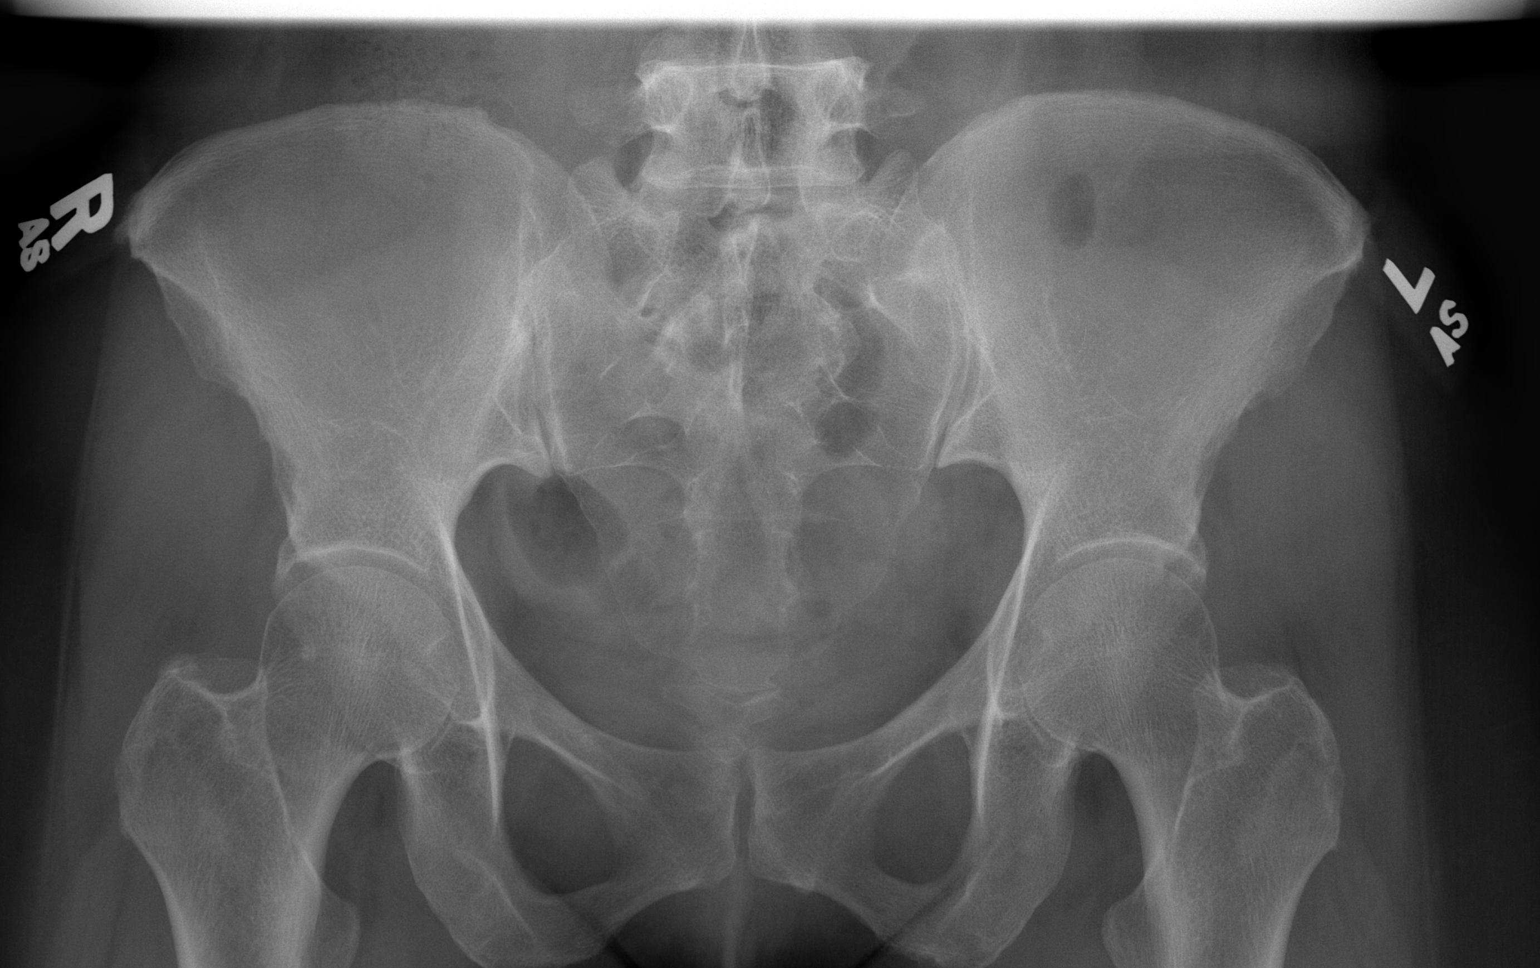

[t sacroiliac joints (2 of 3)]
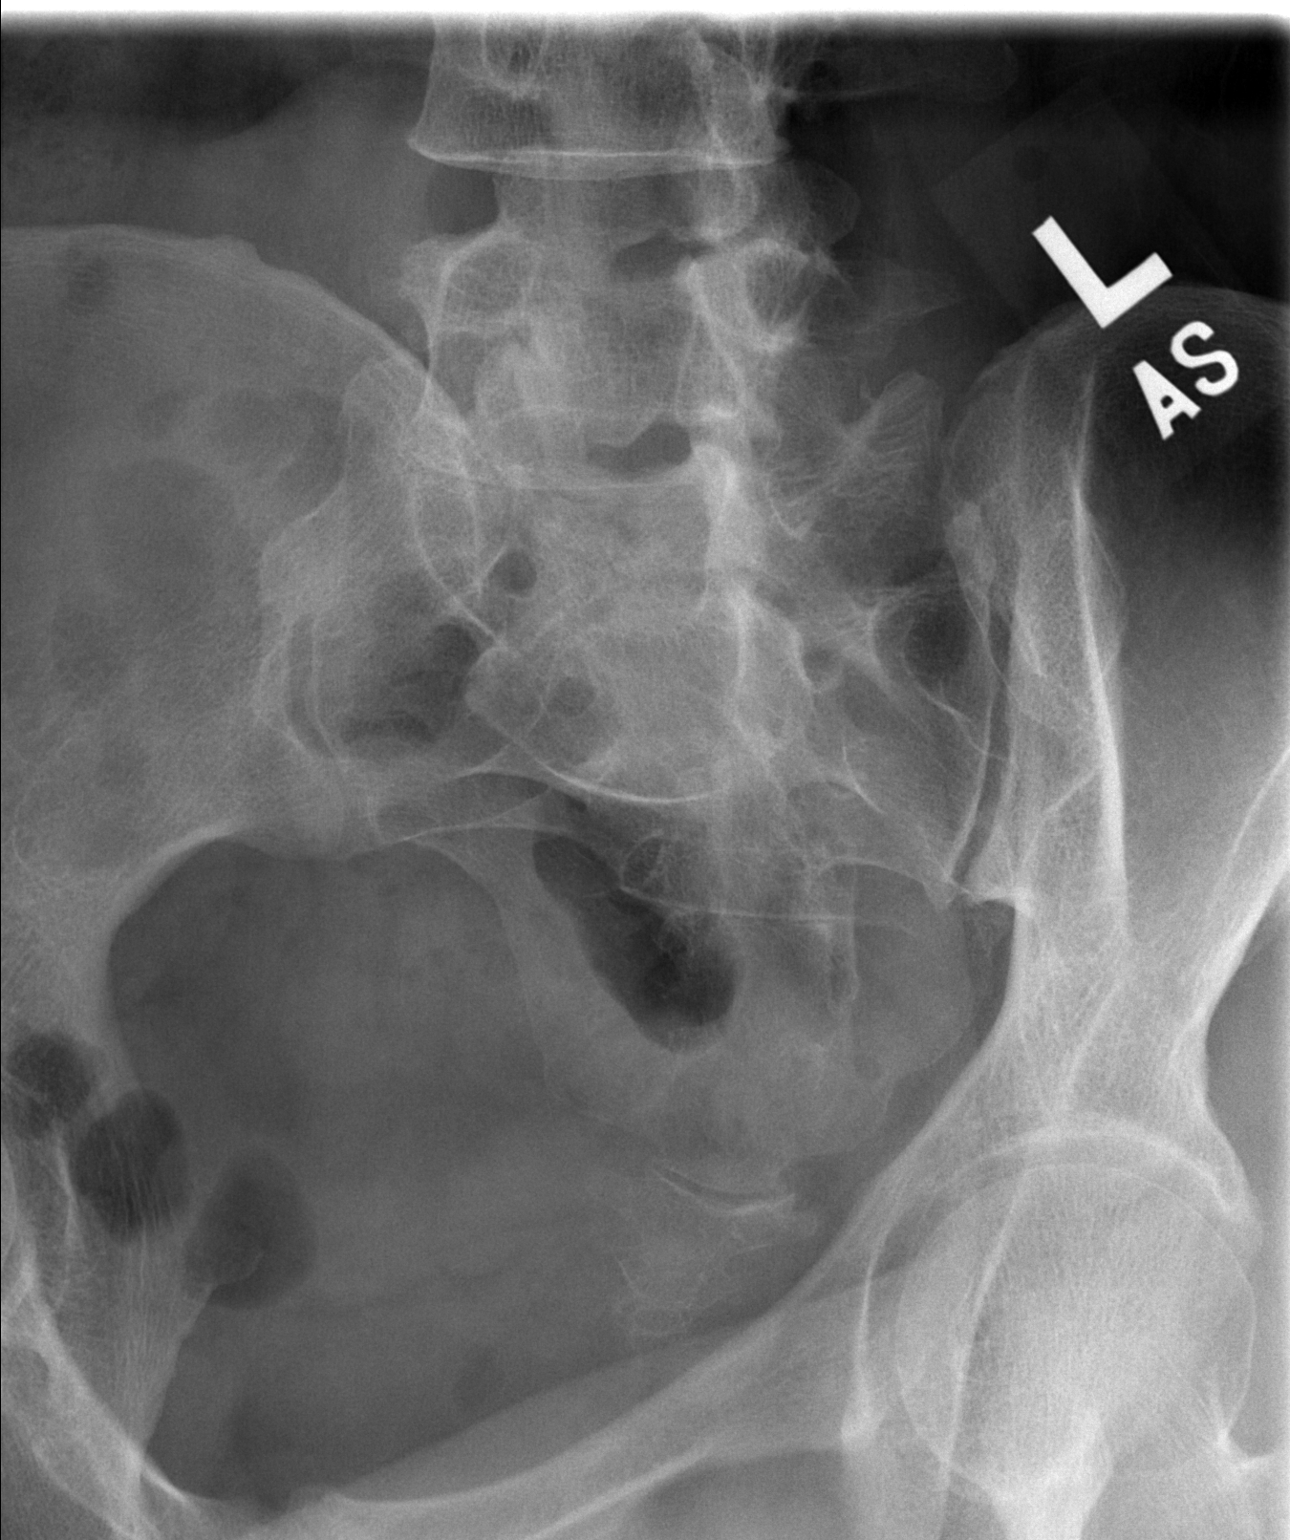

[t sacroiliac joints (3 of 3)]
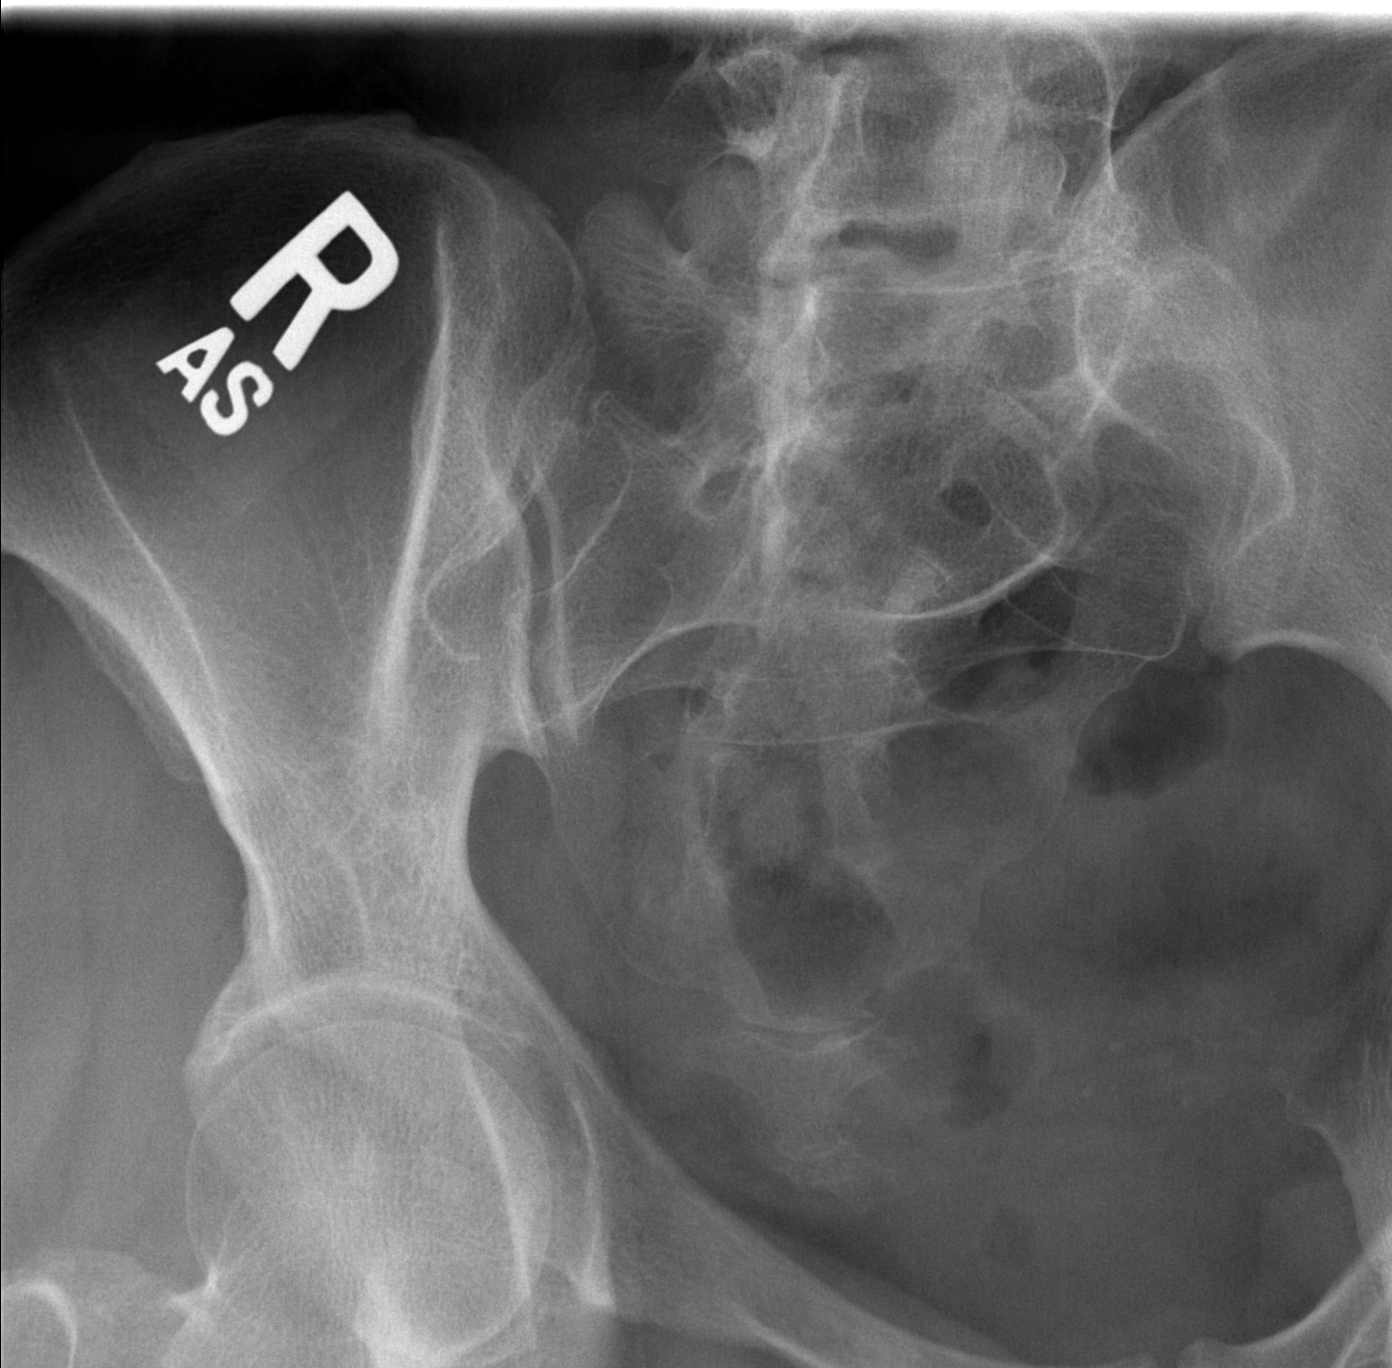

[3 of 3 positions shown; findings below may reference images not displayed]

FINDINGS: The sacroiliac joint spaces are maintained and there is no evidence
of arthropathy. No other bone abnormalities are seen.
IMPRESSION: Negative.

## 2014-01-14 ENCOUNTER — Other Ambulatory Visit (HOSPITAL_COMMUNITY): Payer: Self-pay | Admitting: Family Medicine

## 2014-01-14 DIAGNOSIS — Z1231 Encounter for screening mammogram for malignant neoplasm of breast: Secondary | ICD-10-CM

## 2014-02-07 ENCOUNTER — Ambulatory Visit (HOSPITAL_COMMUNITY): Payer: BC Managed Care – PPO | Attending: Family Medicine

## 2014-04-29 ENCOUNTER — Other Ambulatory Visit: Payer: Self-pay | Admitting: Obstetrics and Gynecology

## 2014-04-29 ENCOUNTER — Other Ambulatory Visit (HOSPITAL_COMMUNITY)
Admission: RE | Admit: 2014-04-29 | Discharge: 2014-04-29 | Disposition: A | Discharge status: Home / Self Care | Payer: 59 | Source: Ambulatory Visit | Attending: Obstetrics and Gynecology | Admitting: Obstetrics and Gynecology

## 2014-04-29 DIAGNOSIS — Z01419 Encounter for gynecological examination (general) (routine) without abnormal findings: Secondary | ICD-10-CM | POA: Insufficient documentation

## 2014-04-30 LAB — CYTOLOGY - PAP

## 2015-04-17 ENCOUNTER — Other Ambulatory Visit: Payer: Self-pay

## 2015-04-17 DIAGNOSIS — Z1231 Encounter for screening mammogram for malignant neoplasm of breast: Secondary | ICD-10-CM

## 2015-04-30 ENCOUNTER — Ambulatory Visit: Payer: Self-pay

## 2015-05-02 ENCOUNTER — Other Ambulatory Visit: Payer: Self-pay | Admitting: Obstetrics and Gynecology

## 2015-05-02 DIAGNOSIS — N644 Mastodynia: Secondary | ICD-10-CM

## 2015-05-08 ENCOUNTER — Ambulatory Visit
Admission: RE | Admit: 2015-05-08 | Discharge: 2015-05-08 | Disposition: A | Discharge status: Home / Self Care | Payer: PRIVATE HEALTH INSURANCE | Source: Ambulatory Visit | Attending: Obstetrics and Gynecology | Admitting: Obstetrics and Gynecology

## 2015-05-08 ENCOUNTER — Other Ambulatory Visit: Payer: Self-pay | Admitting: Obstetrics and Gynecology

## 2015-05-08 DIAGNOSIS — N644 Mastodynia: Secondary | ICD-10-CM

## 2015-05-08 IMAGING — MG MM DIAG BREAST TOMO BILATERAL
8 of 14 series · 8 of 34 positions shown · non-contrast
Comparison: Previous exam(s).

ADDENDUM:
Correction to voice recognition error in RECOMMENDATION: As the
patient is spontaneously improving, no therapeutic intervention is
currently recommended. An ultrasound of the periareolar 4 to 8
o'clock position of the left breast is recommended in 4 weeks.
CLINICAL DATA: Patient complains of left breast 6 o'clock
periareolar pain fullness and areolar thickening for the past 2
weeks, which is spontaneously improved

EXAM:
2D DIGITAL DIAGNOSTIC BILATERAL MAMMOGRAM WITH CAD AND ADJUNCT TOMO
ULTRASOUND LEFT BREAST

[R CC synth-2D]
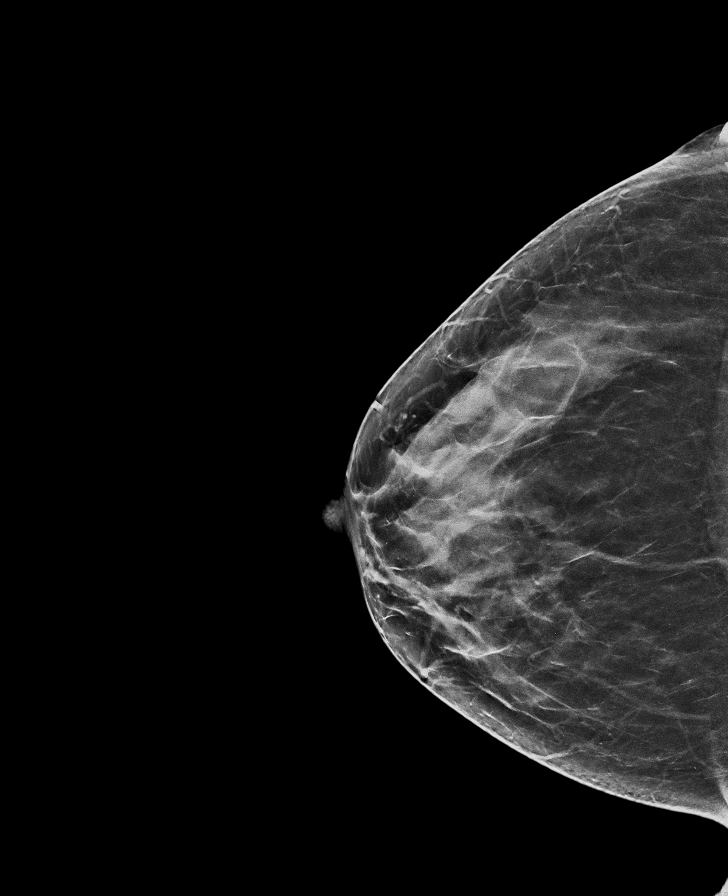

[R CC]
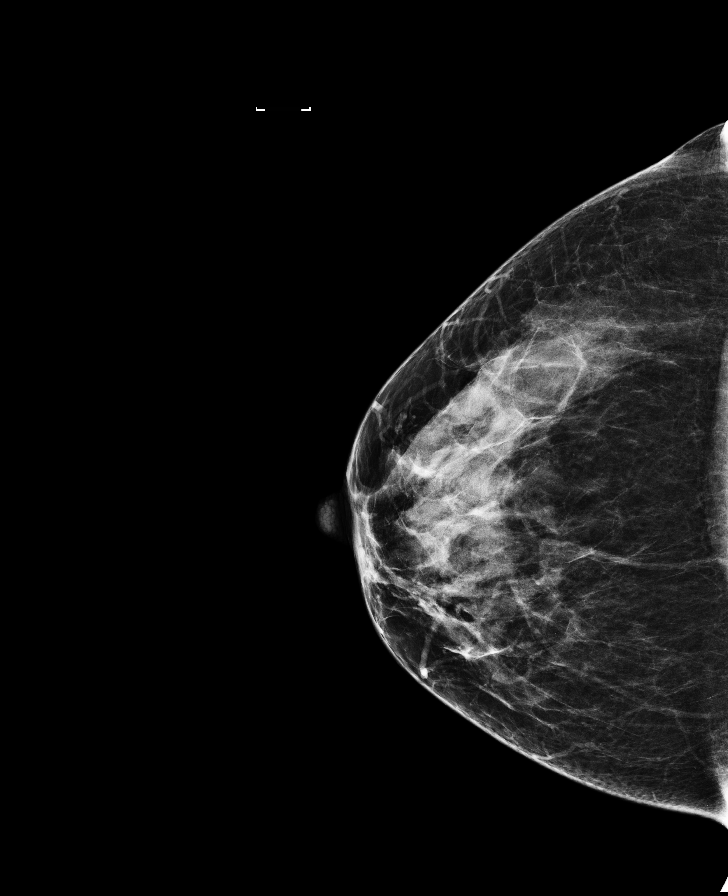

[L CC synth-2D]
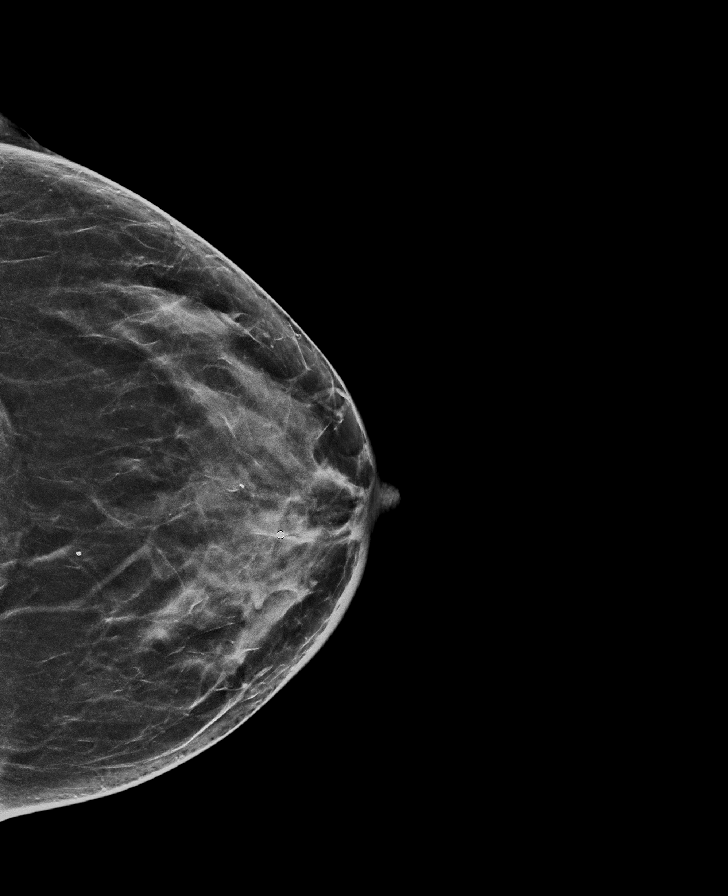

[R MLO synth-2D]
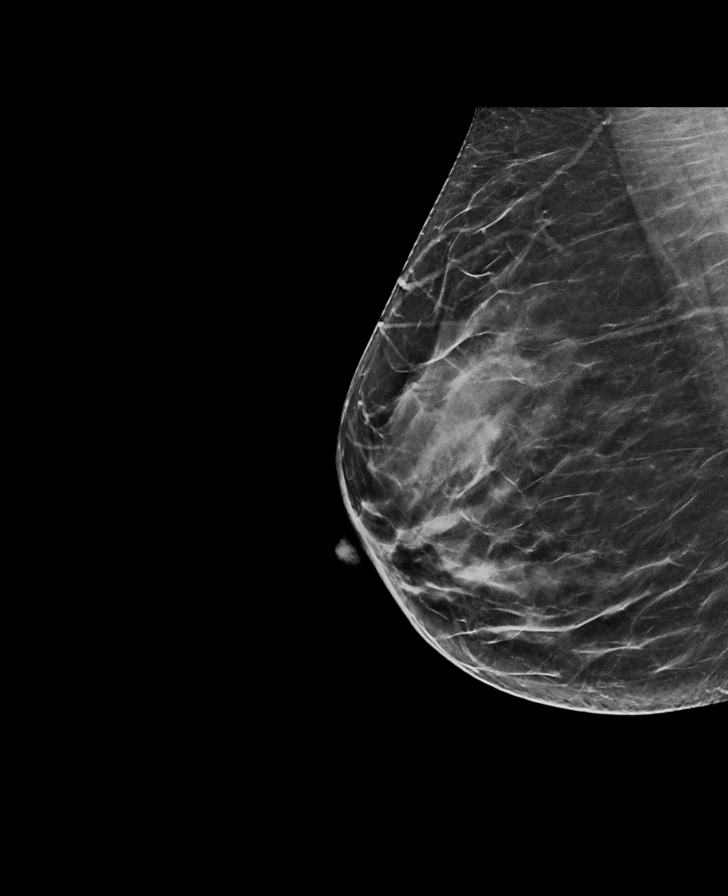

[L MLO synth-2D]
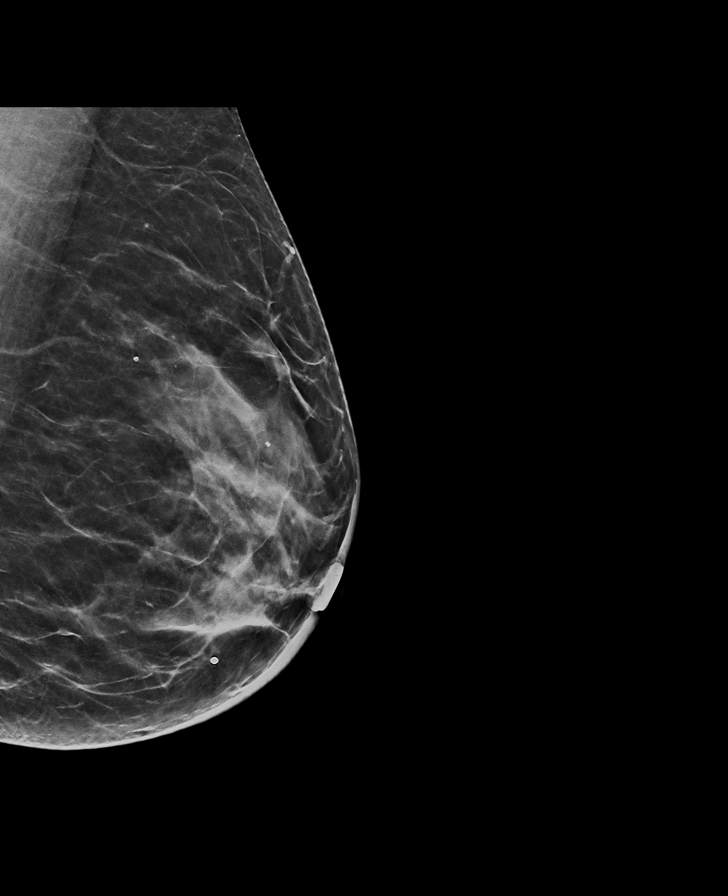

[R MLO]
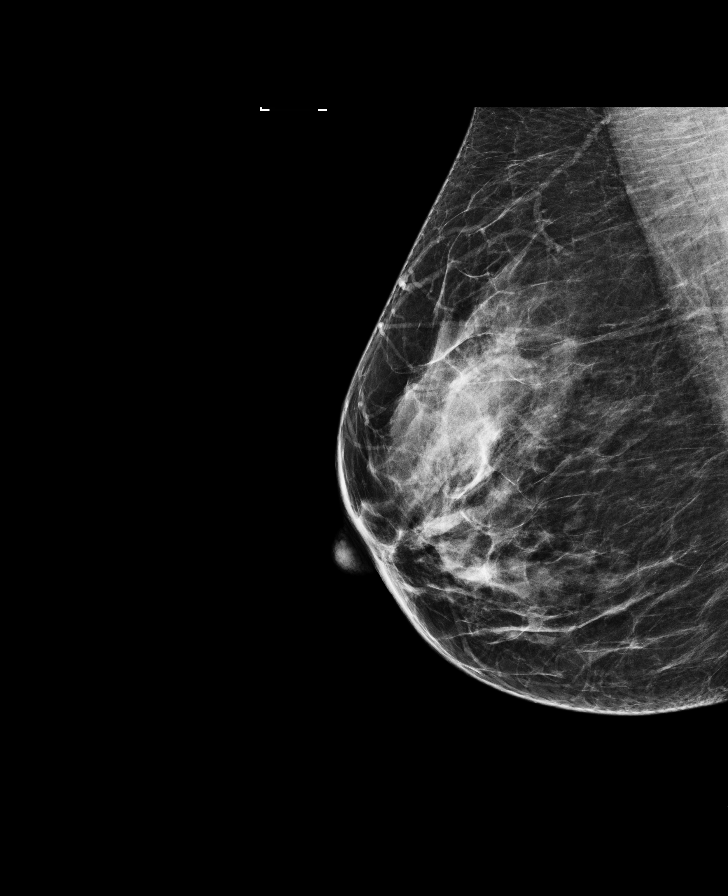

[L CC]
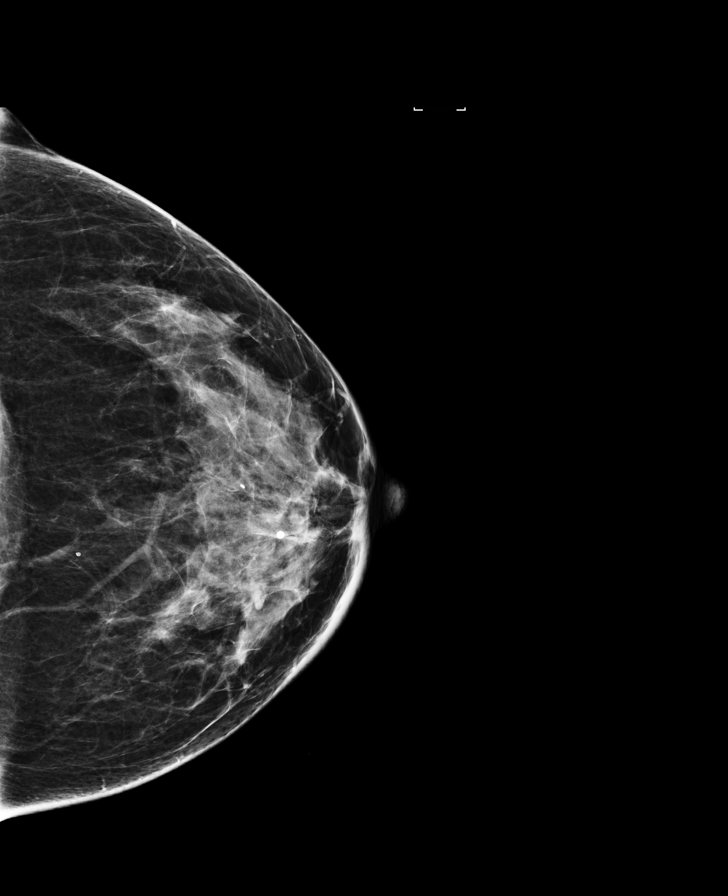

[L MLO]
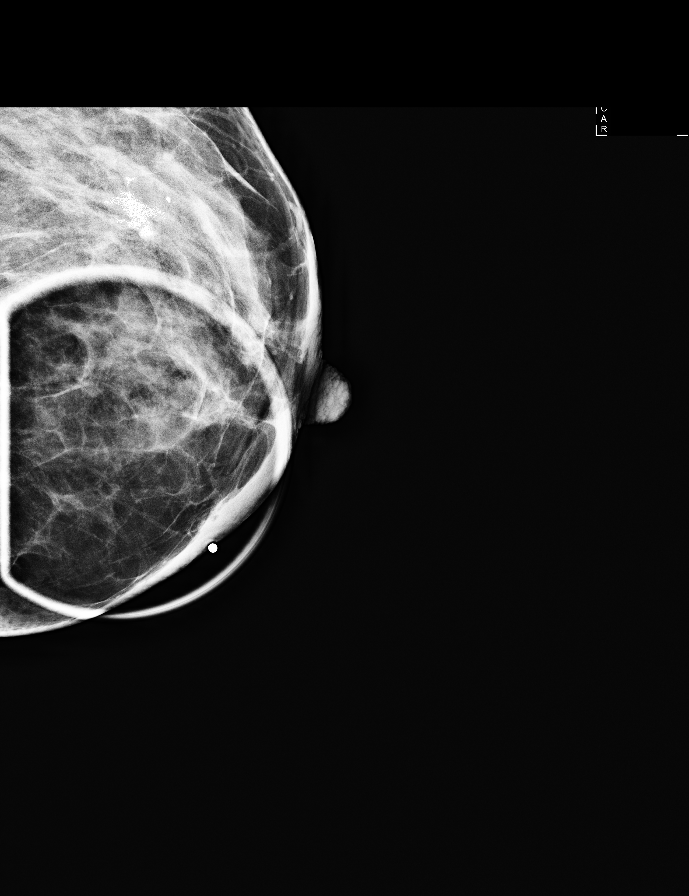

[8 of 34 positions shown; findings below may reference images not displayed]

ACR Breast Density Category c: The breast tissue is heterogeneously
dense, which may obscure small masses.
FINDINGS: Right breast is unchanged and negative. On the left, there is medial
and inferior mild periareolar skin thickening. There are no
suspicious findings.

Mammographic images were processed with CAD.

On physical exam, the inferior left areola is mildly thickened and
more firm than the right. There are no palpable masses. There is no
visible evidence of mastitis.

Targeted ultrasound is performed, showing thickening of the dermis
involving the left areola from the 4 through 8 o'clock position.
Within the dermis, there are hypoechoic tubular structures
suggesting prominent vessels. There is also an 11 x 4 x 5 mm
hypoechoic fluid collection with internal echoes and peripheral
vascularity. No focal abnormalities with in the breast parenchyma.
IMPRESSION: Findings consistent with mild cellulitis or dermatitis. Nonspecific
intradermal fluid collection could be a tiny abscess.

RECOMMENDATION:
Head patient is spontaneously improving, no therapeutic intervention
is currently recommended. An ultrasound of the periareolar 4 to 8
o'clock position of the left breast is recommended in 4 weeks.

I have discussed the findings and recommendations with the patient.
Results were also provided in writing at the conclusion of the
visit. If applicable, a reminder letter will be sent to the patient
regarding the next appointment.

BI-RADS CATEGORY  3: Probably benign.

## 2015-05-09 ENCOUNTER — Ambulatory Visit: Payer: Self-pay

## 2015-05-12 ENCOUNTER — Other Ambulatory Visit: Payer: Self-pay | Admitting: Obstetrics and Gynecology

## 2015-05-12 ENCOUNTER — Other Ambulatory Visit (HOSPITAL_COMMUNITY)
Admission: RE | Admit: 2015-05-12 | Discharge: 2015-05-12 | Disposition: A | Discharge status: Home / Self Care | Payer: PRIVATE HEALTH INSURANCE | Source: Ambulatory Visit | Attending: Obstetrics and Gynecology | Admitting: Obstetrics and Gynecology

## 2015-05-12 DIAGNOSIS — Z01419 Encounter for gynecological examination (general) (routine) without abnormal findings: Secondary | ICD-10-CM | POA: Insufficient documentation

## 2015-05-14 LAB — CYTOLOGY - PAP

## 2015-06-05 ENCOUNTER — Inpatient Hospital Stay: Admission: RE | Admit: 2015-06-05 | Payer: PRIVATE HEALTH INSURANCE | Source: Ambulatory Visit

## 2016-03-26 ENCOUNTER — Encounter (HOSPITAL_BASED_OUTPATIENT_CLINIC_OR_DEPARTMENT_OTHER): Payer: Self-pay

## 2016-03-26 ENCOUNTER — Emergency Department (HOSPITAL_BASED_OUTPATIENT_CLINIC_OR_DEPARTMENT_OTHER): Payer: PRIVATE HEALTH INSURANCE

## 2016-03-26 ENCOUNTER — Emergency Department (HOSPITAL_BASED_OUTPATIENT_CLINIC_OR_DEPARTMENT_OTHER)
Admission: EM | Admit: 2016-03-26 | Discharge: 2016-03-26 | Disposition: A | Discharge status: Home / Self Care | Payer: PRIVATE HEALTH INSURANCE | Attending: Emergency Medicine | Admitting: Emergency Medicine

## 2016-03-26 DIAGNOSIS — N760 Acute vaginitis: Secondary | ICD-10-CM | POA: Insufficient documentation

## 2016-03-26 DIAGNOSIS — B9689 Other specified bacterial agents as the cause of diseases classified elsewhere: Secondary | ICD-10-CM

## 2016-03-26 DIAGNOSIS — D649 Anemia, unspecified: Secondary | ICD-10-CM

## 2016-03-26 DIAGNOSIS — R1031 Right lower quadrant pain: Secondary | ICD-10-CM

## 2016-03-26 HISTORY — DX: Unspecified ovarian cyst, unspecified side: N83.209

## 2016-03-26 LAB — CBC WITH DIFFERENTIAL/PLATELET
BASOS ABS: 0 10*3/uL (ref 0.0–0.1)
BASOS PCT: 0 %
EOS ABS: 0.1 10*3/uL (ref 0.0–0.7)
Eosinophils Relative: 1 %
HCT: 34.2 % — ABNORMAL LOW (ref 36.0–46.0)
HEMOGLOBIN: 11.3 g/dL — AB (ref 12.0–15.0)
Lymphocytes Relative: 45 %
Lymphs Abs: 2.5 10*3/uL (ref 0.7–4.0)
MCH: 30.3 pg (ref 26.0–34.0)
MCHC: 33 g/dL (ref 30.0–36.0)
MCV: 91.7 fL (ref 78.0–100.0)
Monocytes Absolute: 0.5 10*3/uL (ref 0.1–1.0)
Monocytes Relative: 8 %
NEUTROS PCT: 46 %
Neutro Abs: 2.6 10*3/uL (ref 1.7–7.7)
PLATELETS: 326 10*3/uL (ref 150–400)
RBC: 3.73 MIL/uL — AB (ref 3.87–5.11)
RDW: 12.8 % (ref 11.5–15.5)
WBC: 5.7 10*3/uL (ref 4.0–10.5)

## 2016-03-26 LAB — COMPREHENSIVE METABOLIC PANEL
ALBUMIN: 4.1 g/dL (ref 3.5–5.0)
ALT: 17 U/L (ref 14–54)
AST: 20 U/L (ref 15–41)
Alkaline Phosphatase: 49 U/L (ref 38–126)
Anion gap: 6 (ref 5–15)
BUN: 15 mg/dL (ref 6–20)
CALCIUM: 9.2 mg/dL (ref 8.9–10.3)
CHLORIDE: 105 mmol/L (ref 101–111)
CO2: 28 mmol/L (ref 22–32)
CREATININE: 0.67 mg/dL (ref 0.44–1.00)
GFR calc Af Amer: >60 mL/min (ref >60)
GFR calc non Af Amer: >60 mL/min (ref >60)
GLUCOSE: 98 mg/dL (ref 65–99)
Potassium: 4.2 mmol/L (ref 3.5–5.1)
SODIUM: 139 mmol/L (ref 135–145)
Total Bilirubin: 0.4 mg/dL (ref 0.3–1.2)
Total Protein: 7.4 g/dL (ref 6.5–8.1)

## 2016-03-26 LAB — URINALYSIS, ROUTINE W REFLEX MICROSCOPIC
Bilirubin Urine: NEGATIVE
GLUCOSE, UA: NEGATIVE mg/dL
Hgb urine dipstick: NEGATIVE
KETONES UR: NEGATIVE mg/dL
LEUKOCYTES UA: NEGATIVE
Nitrite: NEGATIVE
Protein, ur: NEGATIVE mg/dL
Specific Gravity, Urine: 1.022 (ref 1.005–1.030)
pH: 8.5 — ABNORMAL HIGH (ref 5.0–8.0)

## 2016-03-26 LAB — WET PREP, GENITAL
SPERM: NONE SEEN
Trich, Wet Prep: NONE SEEN
Yeast Wet Prep HPF POC: NONE SEEN

## 2016-03-26 LAB — PREGNANCY, URINE: PREG TEST UR: NEGATIVE

## 2016-03-26 IMAGING — CT CT ABD-PELV W/ CM
2 of 5 series · 16 of 46 positions shown, 18 images · IV contrast (APPLIED)
Comparison: None.

CLINICAL DATA: [age] history of right lower quadrant pain.

EXAM:
CT ABDOMEN AND PELVIS WITH CONTRAST
TECHNIQUE: Multidetector CT imaging of the abdomen and pelvis was performed
using the standard protocol following bolus administration of
intravenous contrast.
CONTRAST:  100mL [4F] IOPAMIDOL ([4F]) INJECTION 61%

[Series 2: axial st · axial · 0.76mm/px · z∈[+710,+1135]mm · 13 of 97 slices shown, 15 images]
[im 6/97  soft-tissue]
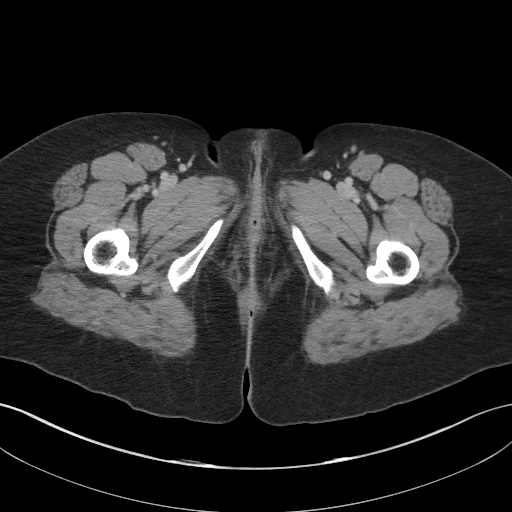
[im 6/97  bone]
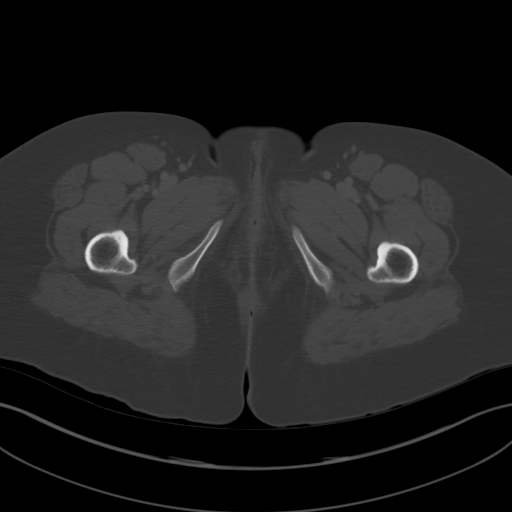
[im 12/97  soft-tissue]
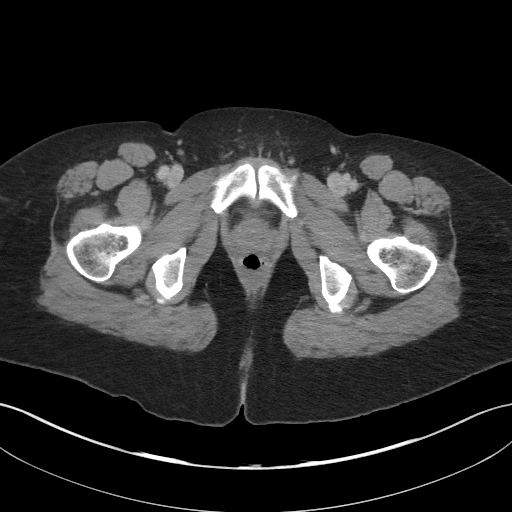
[im 23/97  soft-tissue]
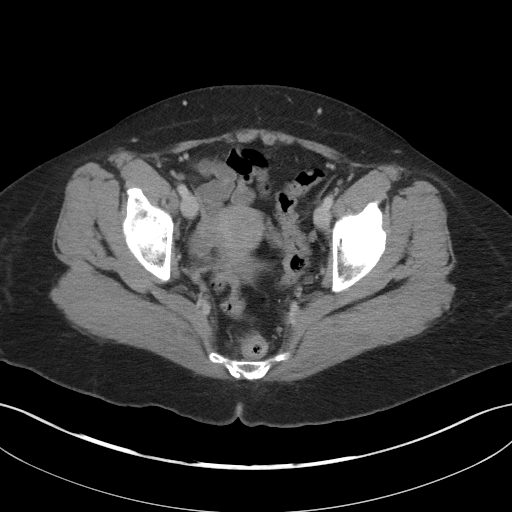
[im 29/97  soft-tissue]
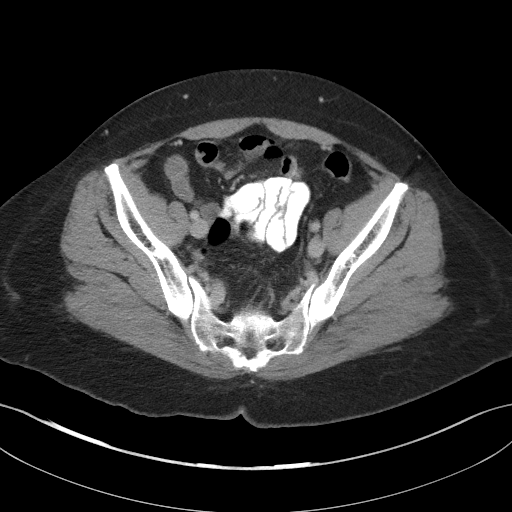
[im 34/97  soft-tissue]
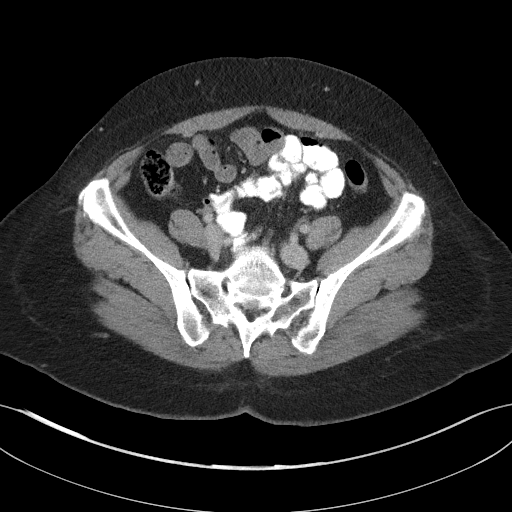
[im 40/97  soft-tissue]
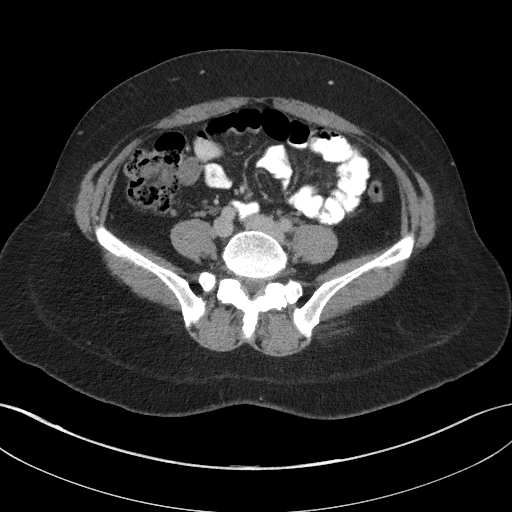
[im 51/97  soft-tissue]
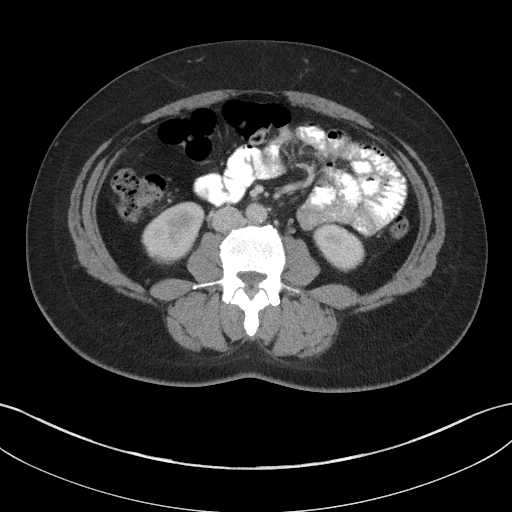
[im 57/97  soft-tissue]
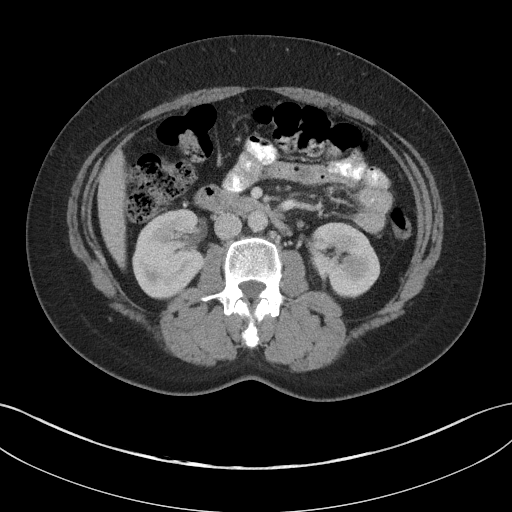
[im 63/97  soft-tissue]
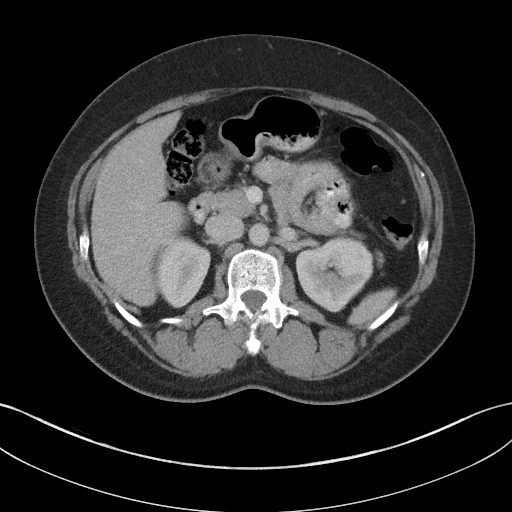
[im 63/97  bone]
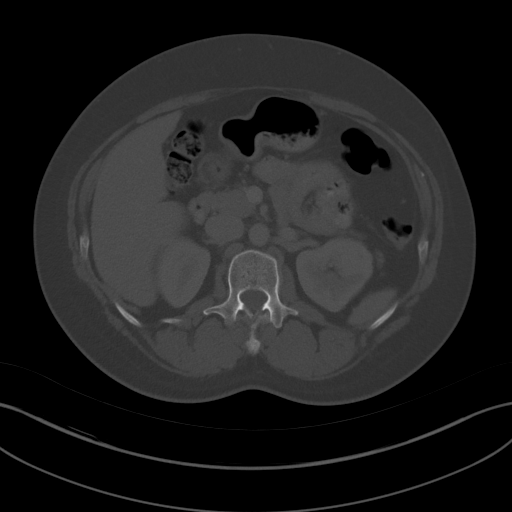
[im 68/97  soft-tissue]
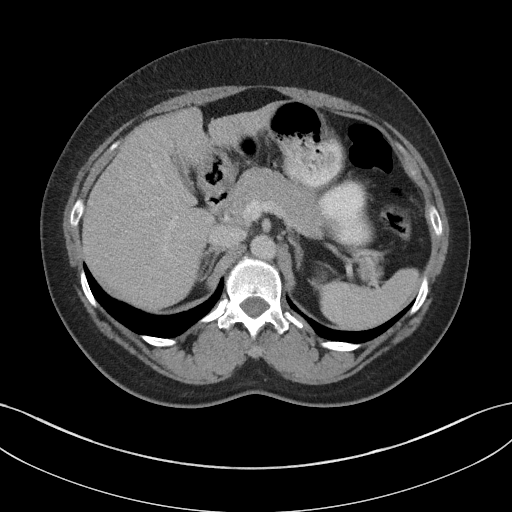
[im 74/97  soft-tissue]
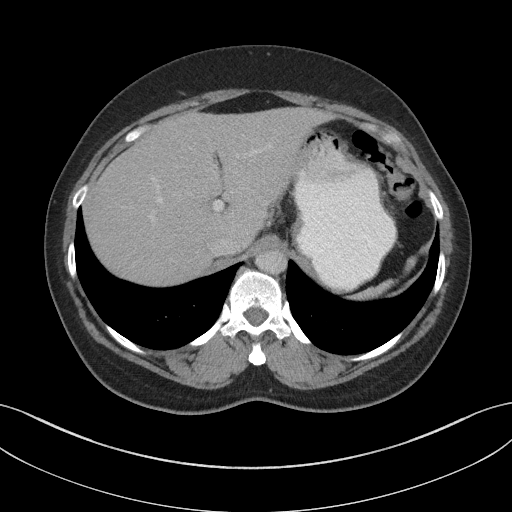
[im 85/97  soft-tissue]
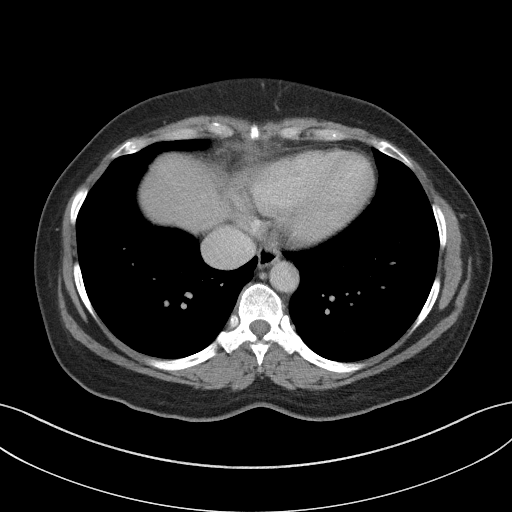
[im 91/97  soft-tissue]
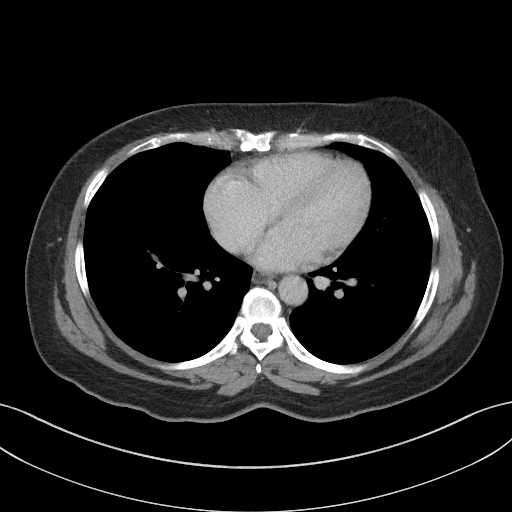

[Series 5: coronal st · coronal · 0.84mm/px · 3 of 93 slices shown]
[im 31/93  soft-tissue]
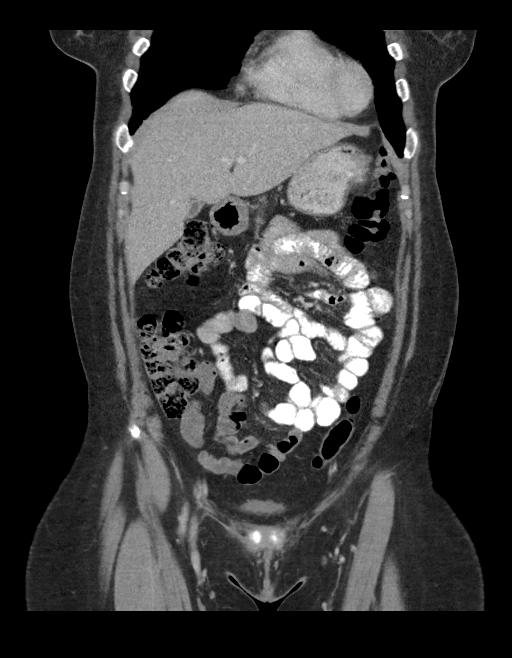
[im 41/93  soft-tissue]
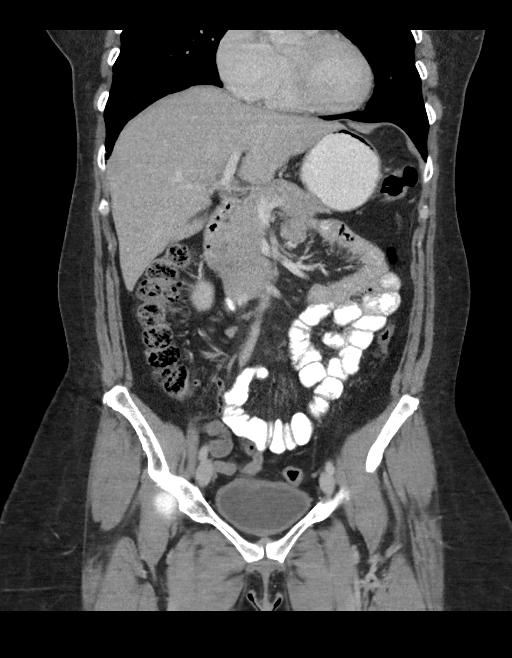
[im 52/93  soft-tissue]
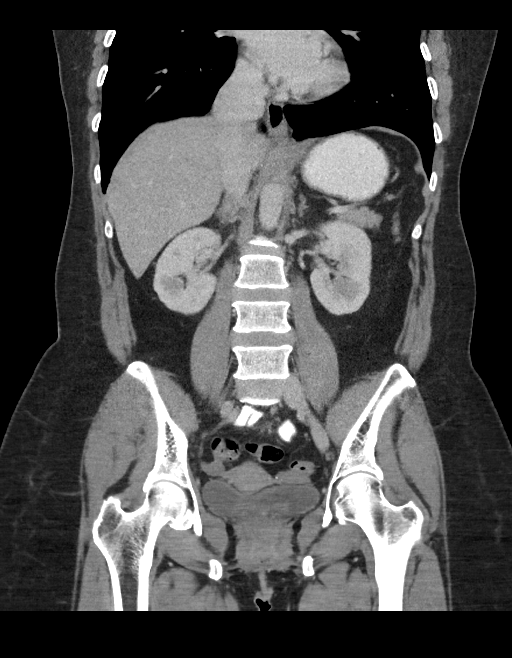

[16 of 46 positions shown; findings below may reference images not displayed]

FINDINGS: Lower chest:  Unremarkable.

Hepatobiliary: No focal abnormality within the liver parenchyma.
There is no evidence for gallstones, gallbladder wall thickening, or
pericholecystic fluid. No intrahepatic or extrahepatic biliary
dilation.

Pancreas: No focal mass lesion. No dilatation of the main duct. No
intraparenchymal cyst. No peripancreatic edema.

Spleen: No splenomegaly. No focal mass lesion.

Adrenals/Urinary Tract: No adrenal nodule or mass. Punctate
nonobstructing stones are seen in each kidney no enhancing renal
lesion. No evidence for hydroureter. The urinary bladder appears
normal for the degree of distention.

Stomach/Bowel: Stomach is nondistended. No gastric wall thickening.
No evidence of outlet obstruction. Duodenum is normally positioned
as is the ligament of Treitz. No small bowel wall thickening. No
small bowel dilatation. The terminal ileum is normal. The appendix
is normal. No gross colonic mass. No colonic wall thickening. No
substantial diverticular change.

Vascular/Lymphatic: There is abdominal aortic atherosclerosis
without aneurysm. There is no gastrohepatic or hepatoduodenal
ligament lymphadenopathy. No intraperitoneal or retroperitoneal
lymphadenopathy. No pelvic sidewall lymphadenopathy.

Reproductive: The uterus has normal CT imaging appearance. There is
no adnexal mass.

Other: No intraperitoneal free fluid.

Musculoskeletal: Bone windows reveal no worrisome lytic or sclerotic
osseous lesions.
IMPRESSION: 1. No acute findings in the abdomen or pelvis. Specifically, no
findings to explain the history of right lower quadrant pain.
Appendix and terminal ileum are normal. No right adnexal mass.

## 2016-03-26 MED ORDER — METRONIDAZOLE 500 MG PO TABS: 500.0000 mg | ORAL_TABLET | Freq: Two times a day (BID) | ORAL | 0 refills | Status: DC

## 2016-03-26 MED ORDER — IOPAMIDOL (ISOVUE-300) INJECTION 61%
100.0000 mL | Freq: Once | INTRAVENOUS | Status: AC | PRN
  Administered 2016-03-26: 100 mL via INTRAVENOUS

## 2016-03-26 NOTE — ED Triage Notes (Signed)
C/o RLQ pain x 4 days-sent from PCP office-NAD-steady gait

## 2016-03-26 NOTE — Discharge Instructions (Signed)
Your work up today was reassuring, your pelvic exam revealed that you have bacterial vaginosis; take flagyl as directed until completed to treat the bacterial vaginosis, do not drink alcohol while taking this medication. Alternate between tylenol and motrin as needed for pain. May consider using heating pad to the area of pain as well. It's possible that you have a pulled muscle causing your pain, or potentially a very small ovarian cyst, although the CT scan today did not show one.  You have been tested for gonorrhea, chlamydia, HIV, and syphilis in the ER but the hospital will call you if lab is positive. DO NOT ENGAGE IN SEXUAL ACTIVITY UNTIL YOU FIND OUT ABOUT YOUR RESULTS AND HAVE PARTNERS TESTED AND TREATED. ALL PARTNERS MUST BE TESTED AND TREATED FOR STD'S. ALWAYS USE CONDOMS WHEN ENGAGING IN INTERCOURSE. Follow up with [facility] STD clinic for future STD concerns or screenings.  Follow up with your regular doctor in 1 week for recheck of symptoms. Return to the [facility] hospital emergency department (called the [facility]) for changes or worsening symptoms.

## 2016-03-26 NOTE — ED Notes (Signed)
Patient transported to CT 

## 2016-03-26 NOTE — ED Provider Notes (Signed)
[facility] DEPT [facility] Provider Note   CSN: [id number] Arrival date & time: 03/26/16  1741  By signing my name below, I, Gwenlyn Fudge, attest that this documentation has been prepared under the direction and in the presence of [address] [place]. Electronically Signed: Gwenlyn Fudge, ED Scribe. 03/26/16. 7:03 PM.  History   Chief Complaint Chief Complaint  Patient presents with  . Abdominal Pain   The history is provided by the patient and medical records. No language interpreter was used.  Abdominal Pain   This is a new problem. The current episode started more than 2 days ago. The problem occurs daily. The problem has not changed since onset.The pain is associated with an unknown factor. The pain is located in the RLQ. The quality of the pain is sharp. The pain is at a severity of 7/10. The pain is moderate. Pertinent negatives include fever, diarrhea, flatus, hematochezia, melena, nausea, vomiting, constipation, dysuria, hematuria, arthralgias and myalgias. The symptoms are aggravated by certain positions. Nothing relieves the symptoms.   HPI Comments: Lauren Levy is a 46 y.o. female with a PMHx of ovarian cysts, who presents to the Emergency Department sent in by her PCPs office complaining of RLQ abd pain x4 days. Pt describes pain as gradual onset, 7/10, intermittent, sharp, non-radiating, RLQ abdominal pain that is exacerbated with bending or twisting. No treatments tried. No alleviating factors noted. She saw her PCP today with normal blood work and urine test, but they advised her to come here in order to r/o appendicitis or ovarian cyst. No abdominal PSHx. She denies recent travel or suspicious food intake. She denies sick contacts with similar symptoms. Pt occasionally drinks alcohol, most recently on Valentine's day, but denies chronic regular use. No regular NSAID use. LNMP on 03/10/2016. Pt reports protected and unprotected intercourse with 1 female partner (her husband). She  denies fevers, chills, CP, SOB, N/V/D/C, obstipation, melena, hematochezia, hematuria, dysuria, vaginal bleeding or discharge, myalgias, arthralgias, numbness, tingling, focal weakness, rashes, or any other complaints at this time.   Past Medical History:  Diagnosis Date  . Ovarian cyst     There are no active problems to display for this patient.   History reviewed. No pertinent surgical history.  OB History    No data available       Home Medications    Prior to Admission medications   Not on File   Family History No family history on file.  Social History Social History  Substance Use Topics  . Smoking status: Never Smoker  . Smokeless tobacco: Never Used  . Alcohol use Yes     Comment: occ   Allergies   Patient has no known allergies.   Review of Systems Review of Systems  Constitutional: Negative for chills and fever.  Respiratory: Negative for shortness of breath.   Cardiovascular: Negative for chest pain.  Gastrointestinal: Positive for abdominal pain. Negative for blood in stool, constipation, diarrhea, flatus, hematochezia, melena, nausea and vomiting.  Genitourinary: Negative for dysuria, hematuria, vaginal bleeding and vaginal discharge.  Musculoskeletal: Negative for arthralgias and myalgias.  Skin: Negative for color change.  Allergic/Immunologic: Negative for immunocompromised state.  Neurological: Negative for weakness and numbness.  Psychiatric/Behavioral: Negative for confusion.   10 Systems reviewed and are negative for acute change except as noted in the HPI.   Physical Exam Updated Vital Signs BP 118/91 (BP Location: Left Arm)   Pulse 86   Temp 98.6 F (37 C) (Oral)   Resp 18  Ht 5\' 6"  (1.676 m)   Wt 198 lb (89.8 kg)   LMP 03/10/2016   SpO2 100%   BMI 31.96 kg/m   Physical Exam  Constitutional: She is oriented to person, place, and time. Vital signs are normal. She appears well-developed and well-nourished.  Non-toxic  appearance. No distress.  Afebrile, nontoxic, NAD  HENT:  Head: Normocephalic and atraumatic.  Mouth/Throat: Oropharynx is clear and moist and mucous membranes are normal.  Eyes: Conjunctivae and EOM are normal. Right eye exhibits no discharge. Left eye exhibits no discharge.  Neck: Normal range of motion. Neck supple.  Cardiovascular: Normal rate, regular rhythm, normal heart sounds and intact distal pulses.  Exam reveals no gallop and no friction rub.   No murmur heard. Pulmonary/Chest: Effort normal and breath sounds normal. No respiratory distress. She has no decreased breath sounds. She has no wheezes. She has no rhonchi. She has no rales.  Abdominal: Soft. Normal appearance and bowel sounds are normal. She exhibits no distension. There is tenderness in the right lower quadrant. There is no rigidity, no rebound, no guarding, no CVA tenderness, no tenderness at McBurney's point and negative Murphy's sign.  Soft, Nondistended, +BS throughout with mild RLQ TTP near the pelvic brim, no r/g/r, neg murphy's, neg mcburney's, no CVA TTP   Genitourinary: Vagina normal and uterus normal. Pelvic exam was performed with patient supine. There is no rash, tenderness or lesion on the right labia. There is no rash, tenderness or lesion on the left labia. Cervix exhibits discharge. Cervix exhibits no motion tenderness and no friability. Right adnexum displays no mass, no tenderness and no fullness. Left adnexum displays no mass, no tenderness and no fullness. No erythema, tenderness or bleeding in the vagina. No vaginal discharge found.  Genitourinary Comments: Chaperone present for exam. No rashes, lesions, or tenderness to external genitalia. No erythema, injury, or tenderness to vaginal mucosa. No vaginal discharge or bleeding within vaginal vault. No adnexal masses, tenderness, or fullness. No CMT or cervical friability, with scant clear physiologic discharge from cervical os. Cervical os is closed. Uterus  non-deviated, mobile, nonTTP, and without enlargement.    Musculoskeletal: Normal range of motion.  Neurological: She is alert and oriented to person, place, and time. She has normal strength. No sensory deficit.  Skin: Skin is warm, dry and intact. No rash noted.  Psychiatric: She has a normal mood and affect.  Nursing note and vitals reviewed.  ED Treatments / Results  DIAGNOSTIC STUDIES: Oxygen Saturation is 100% on RA, normal by my interpretation.    COORDINATION OF CARE: 6:56 PM Discussed treatment plan with pt at bedside which includes Pelvic exam and pt agreed to plan.  Labs (all labs ordered are listed, but only abnormal results are displayed) Labs Reviewed  WET PREP, GENITAL - Abnormal; Notable for the following:       Result Value   Clue Cells Wet Prep HPF POC PRESENT (*)    WBC, Wet Prep HPF POC MANY (*)    All other components within normal limits  URINALYSIS, ROUTINE W REFLEX MICROSCOPIC - Abnormal; Notable for the following:    pH 8.5 (*)    All other components within normal limits  CBC WITH DIFFERENTIAL/PLATELET - Abnormal; Notable for the following:    RBC 3.73 (*)    Hemoglobin 11.3 (*)    HCT 34.2 (*)    All other components within normal limits  PREGNANCY, URINE  COMPREHENSIVE METABOLIC PANEL  RPR  HIV ANTIBODY (ROUTINE TESTING)  GC/CHLAMYDIA  PROBE AMP ([place]) NOT AT [facility]    EKG  EKG Interpretation None       Radiology Ct Abdomen Pelvis W Contrast  Result Date: 03/26/2016 CLINICAL DATA:  Forty history of right lower quadrant pain. EXAM: CT ABDOMEN AND PELVIS WITH CONTRAST TECHNIQUE: Multidetector CT imaging of the abdomen and pelvis was performed using the standard protocol following bolus administration of intravenous contrast. CONTRAST:  168mL ISOVUE-300 IOPAMIDOL (ISOVUE-300) INJECTION 61% COMPARISON:  None. FINDINGS: Lower chest:  Unremarkable. Hepatobiliary: No focal abnormality within the liver parenchyma. There is no evidence for  gallstones, gallbladder wall thickening, or pericholecystic fluid. No intrahepatic or extrahepatic biliary dilation. Pancreas: No focal mass lesion. No dilatation of the main duct. No intraparenchymal cyst. No peripancreatic edema. Spleen: No splenomegaly. No focal mass lesion. Adrenals/Urinary Tract: No adrenal nodule or mass. Punctate nonobstructing stones are seen in each kidney no enhancing renal lesion. No evidence for hydroureter. The urinary bladder appears normal for the degree of distention. Stomach/Bowel: Stomach is nondistended. No gastric wall thickening. No evidence of outlet obstruction. Duodenum is normally positioned as is the ligament of Treitz. No small bowel wall thickening. No small bowel dilatation. The terminal ileum is normal. The appendix is normal. No gross colonic mass. No colonic wall thickening. No substantial diverticular change. Vascular/Lymphatic: There is abdominal aortic atherosclerosis without aneurysm. There is no gastrohepatic or hepatoduodenal ligament lymphadenopathy. No intraperitoneal or retroperitoneal lymphadenopathy. No pelvic sidewall lymphadenopathy. Reproductive: The uterus has normal CT imaging appearance. There is no adnexal mass. Other: No intraperitoneal free fluid. Musculoskeletal: Bone windows reveal no worrisome lytic or sclerotic osseous lesions. IMPRESSION: 1. No acute findings in the abdomen or pelvis. Specifically, no findings to explain the history of right lower quadrant pain. Appendix and terminal ileum are normal. No right adnexal mass. Electronically Signed   By: Misty Stanley M.D.   On: 03/26/2016 20:56    Procedures Procedures (including critical care time)  Medications Ordered in ED Medications  iopamidol (ISOVUE-300) 61 % injection 100 mL (100 mLs Intravenous Contrast Given 03/26/16 2038)     Initial Impression / Assessment and Plan / ED Course  I have reviewed the triage vital signs and the nursing notes.  Pertinent labs & imaging  results that were available during my care of the patient were reviewed by me and considered in my medical decision making (see chart for details).     46 y.o. female here with RLQ pain x4 days, intermittent, with no other associated symptoms. Sent here from PCP's office to eval for appendicitis vs ovarian cyst. On exam, mild RLQ TTP closer to pelvic brim, not really at mcburney's point, nonperitoneal. U/A neg, Upreg neg. Will obtain labs, get pelvic exam and evaluate need for pelvic u/s vs CT after that.   7:41 PM Pelvic exam with scant clear discharge at the cervix, no pelvic tenderness, no adnexal tenderness or fullness. Will proceed with CT scan. CBC w/diff showing mild anemia, but otherwise unremarkable. CMP pending. Pt declines wanting anything for pain. Will reassess shortly.   9:35 PM CMP WNL. Wet prep with +clue cells, will treat for BV. CT negative for any findings, no cysts seen, appendix unremarkable. It's possible that her pain is either musculoskeletal, vs potentially a very small ovarian cyst not greatly visualized by CT imaging. Doubt need for emergent TVUS, doubt torsion or other emergent etiologies, but if symptoms persist then PCP may want to consider outpatient TVUS. Discussed tylenol/motrin/heat use for pain, will rx flagyl for BV, advised that  her STD test results will return in a few days and if positive results then she would need treatment for herself and her sexual partners. Doubt need for empiric GC/CT tx today. F/up with PCP in 1wk for recheck of symptoms and ongoing evaluation. Strict return precautions advised. I explained the diagnosis and have given explicit precautions to return to the ER including for any other new or worsening symptoms. The patient understands and accepts the medical plan as it's been dictated and I have answered their questions. Discharge instructions concerning home care and prescriptions have been given. The patient is STABLE and is discharged to home  in good condition.   I personally performed the services described in this documentation, which was scribed in my presence. The recorded information has been reviewed and is accurate.   Final Clinical Impressions(s) / ED Diagnoses   Final diagnoses:  Colicky RLQ abdominal pain  BV (bacterial vaginosis)  Anemia, unspecified type    New Prescriptions New Prescriptions   METRONIDAZOLE (FLAGYL) 500 MG TABLET    Take 1 tablet (500 mg total) by mouth 2 (two) times daily. One po bid x 7 days     [address] PA-C 03/26/16 2137    Veryl Speak, MD 03/26/16 2141

## 2016-03-28 LAB — HIV ANTIBODY (ROUTINE TESTING W REFLEX): HIV SCREEN 4TH GENERATION: NONREACTIVE

## 2016-03-28 LAB — RPR: RPR Ser Ql: NONREACTIVE

## 2016-03-29 LAB — GC/CHLAMYDIA PROBE AMP (~~LOC~~) NOT AT ARMC
CHLAMYDIA, DNA PROBE: NEGATIVE
NEISSERIA GONORRHEA: NEGATIVE

## 2016-05-12 ENCOUNTER — Other Ambulatory Visit (HOSPITAL_COMMUNITY)
Admission: RE | Admit: 2016-05-12 | Discharge: 2016-05-12 | Disposition: A | Discharge status: Home / Self Care | Payer: PRIVATE HEALTH INSURANCE | Source: Ambulatory Visit | Attending: Obstetrics and Gynecology | Admitting: Obstetrics and Gynecology

## 2016-05-12 ENCOUNTER — Other Ambulatory Visit: Payer: Self-pay | Admitting: Obstetrics and Gynecology

## 2016-05-12 DIAGNOSIS — Z01419 Encounter for gynecological examination (general) (routine) without abnormal findings: Secondary | ICD-10-CM | POA: Insufficient documentation

## 2016-05-12 DIAGNOSIS — Z1151 Encounter for screening for human papillomavirus (HPV): Secondary | ICD-10-CM | POA: Diagnosis not present

## 2016-05-17 LAB — CYTOLOGY - PAP
Diagnosis: NEGATIVE
HPV: NOT DETECTED

## 2018-03-16 ENCOUNTER — Other Ambulatory Visit: Payer: Self-pay | Admitting: Obstetrics and Gynecology

## 2018-03-16 DIAGNOSIS — N6320 Unspecified lump in the left breast, unspecified quadrant: Secondary | ICD-10-CM

## 2018-03-22 ENCOUNTER — Ambulatory Visit: Payer: PRIVATE HEALTH INSURANCE

## 2018-03-24 ENCOUNTER — Ambulatory Visit: Payer: PRIVATE HEALTH INSURANCE

## 2018-03-24 ENCOUNTER — Ambulatory Visit
Admission: RE | Admit: 2018-03-24 | Discharge: 2018-03-24 | Disposition: A | Discharge status: Home / Self Care | Payer: Self-pay | Source: Ambulatory Visit | Attending: Obstetrics and Gynecology | Admitting: Obstetrics and Gynecology

## 2018-03-24 DIAGNOSIS — N6320 Unspecified lump in the left breast, unspecified quadrant: Secondary | ICD-10-CM

## 2018-03-24 IMAGING — MG DIGITAL DIAGNOSTIC BILATERAL MAMMOGRAM WITH TOMO AND CAD
8 series · 8 of 24 positions shown · non-contrast
Comparison: Previous exam(s).

CLINICAL DATA: Patient returns now for interval follow-up after
cellulitis/dermatitis and possible intradermal abscess in the LOWER
LEFT areola in [DATE].Annual evaluation, RIGHT breast.

EXAM:
DIGITAL DIAGNOSTIC BILATERAL MAMMOGRAM WITH CAD AND TOMO

[R MLO synth-2D]
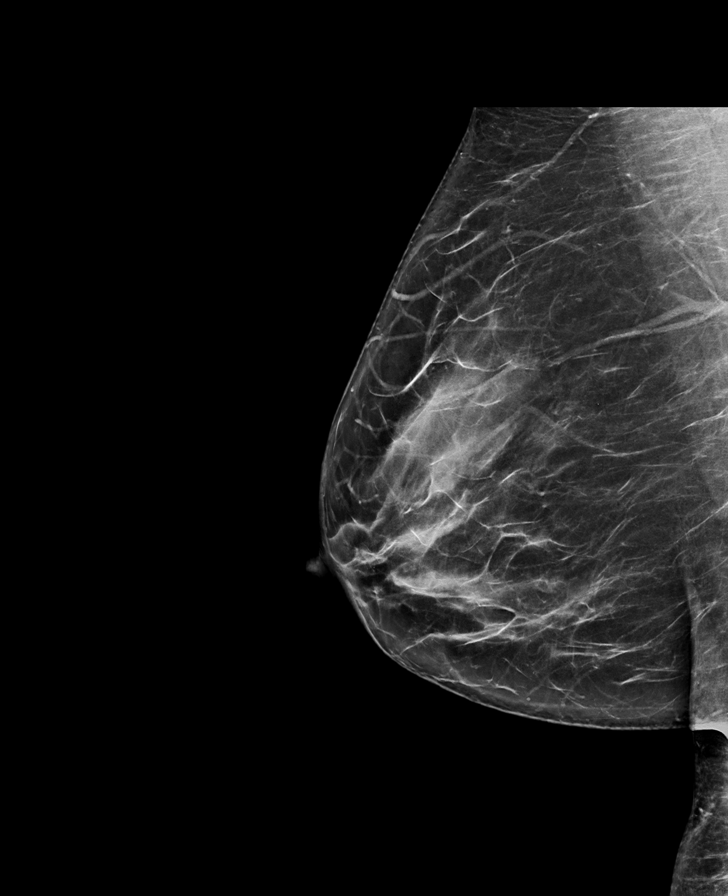

[L MLO synth-2D]
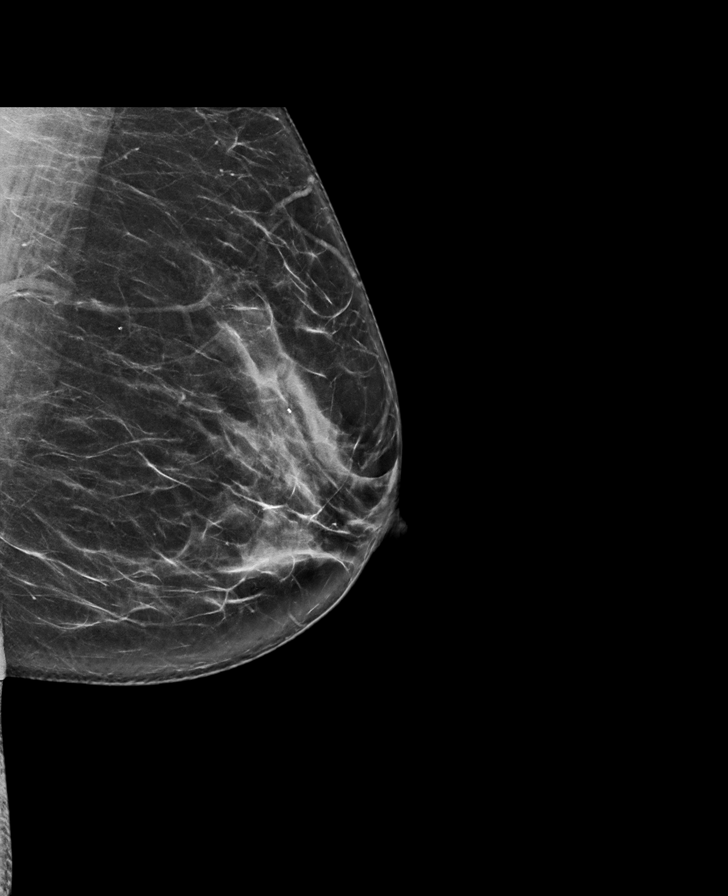

[R CC synth-2D]
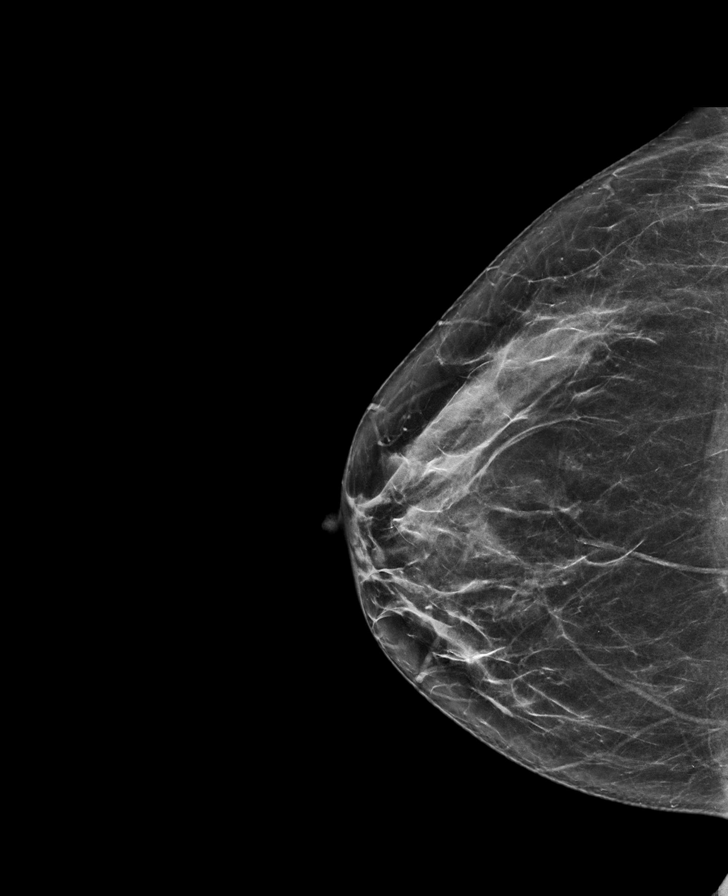

[L CC synth-2D]
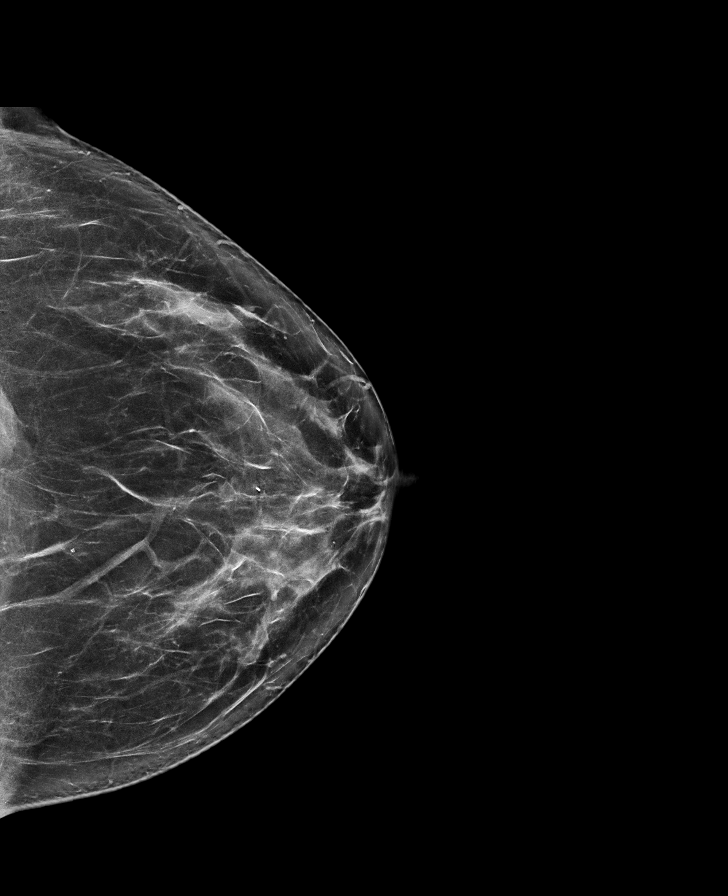

[L MLO tomo · tomo slice 39/77.0]
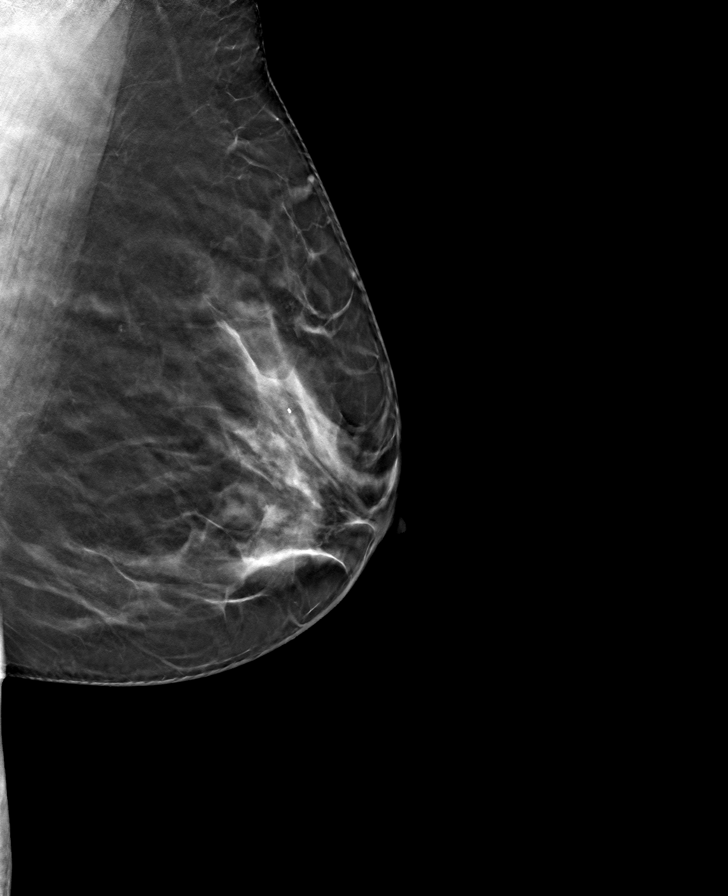

[R MLO tomo · tomo slice 39/78.0]
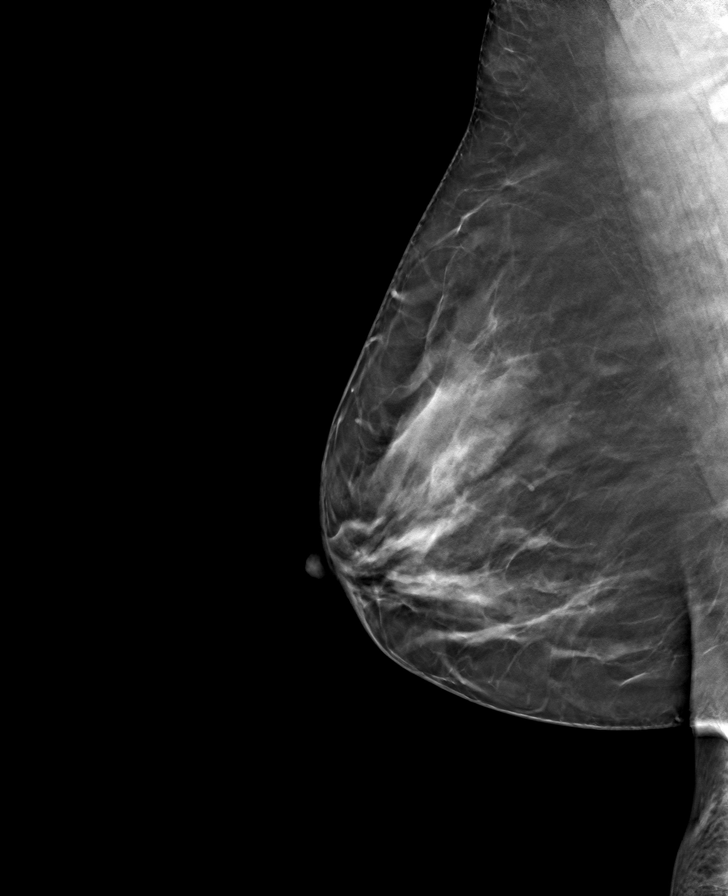

[L CC tomo · tomo slice 39/78.0]
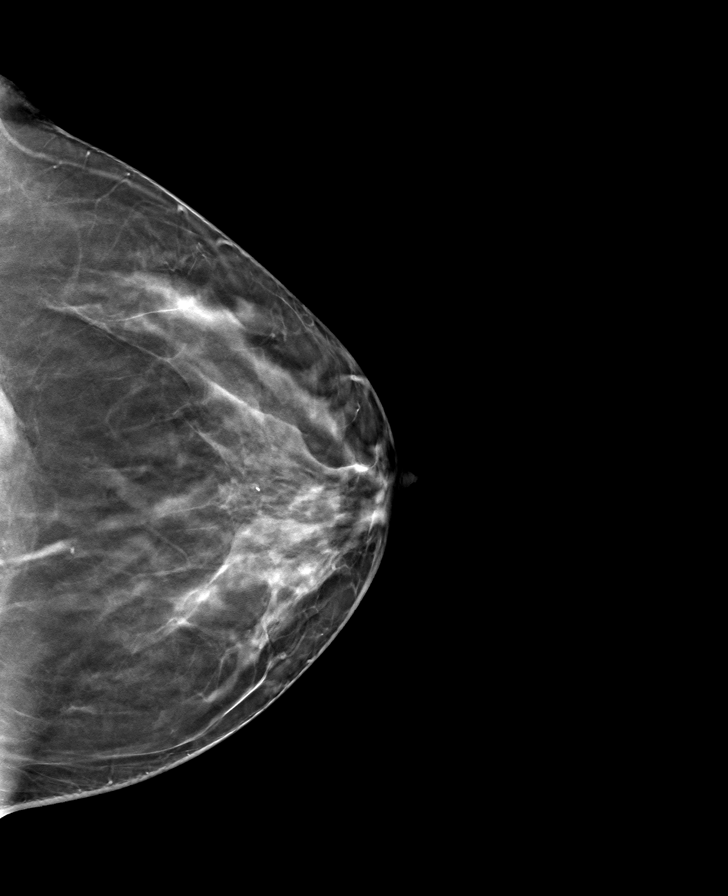

[R CC tomo · tomo slice 35/70.0]
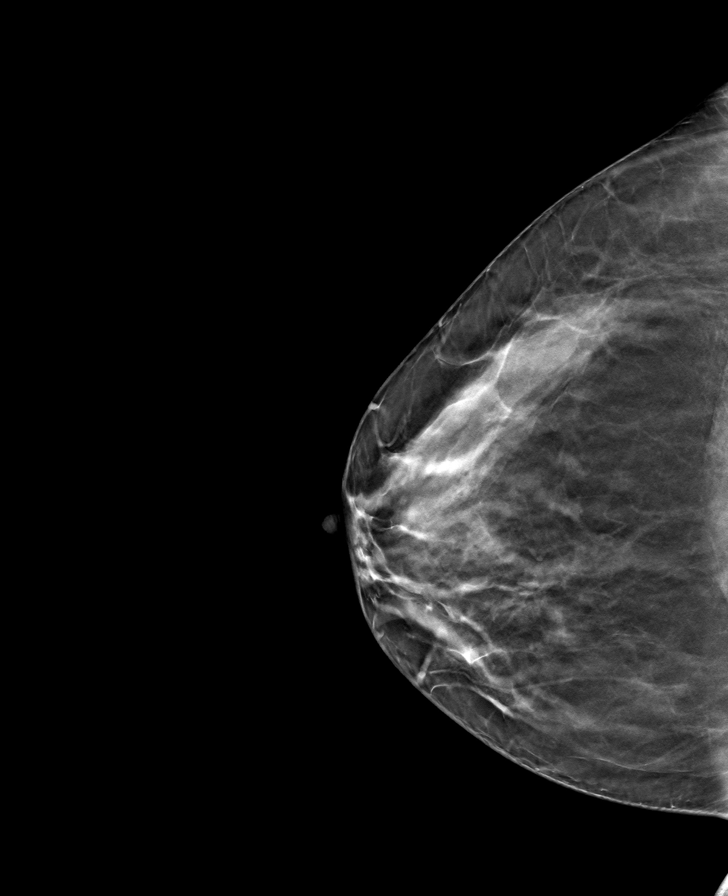

[8 of 24 positions shown; findings below may reference images not displayed]

ACR Breast Density Category c: The breast tissue is heterogeneously
dense, which may obscure small masses.
FINDINGS: Tomosynthesis and synthesized full field CC and MLO views of both
breasts were obtained.

Interval resolution of the skin thickening involving the LOWER LEFT
areola since the [DATE] examination.

No findings suspicious for malignancy in either breast.

Mammographic images were processed with CAD.
IMPRESSION: 1. No mammographic evidence of malignancy involving either breast.
2. Interval resolution of the LOWER LEFT areolar skin thickening
since [DATE].

RECOMMENDATION:
Screening mammogram in one year.(Code:[T2])

I have discussed the findings and recommendations with the patient.
Results were also provided in writing at the conclusion of the
visit. If applicable, a reminder letter will be sent to the patient
regarding the next appointment.

BI-RADS CATEGORY  1: Negative.

## 2019-05-10 ENCOUNTER — Encounter (INDEPENDENT_AMBULATORY_CARE_PROVIDER_SITE_OTHER): Payer: Self-pay | Admitting: Otolaryngology

## 2019-05-10 ENCOUNTER — Ambulatory Visit (INDEPENDENT_AMBULATORY_CARE_PROVIDER_SITE_OTHER): Payer: PRIVATE HEALTH INSURANCE | Admitting: Otolaryngology

## 2019-05-10 ENCOUNTER — Other Ambulatory Visit: Payer: Self-pay

## 2019-05-10 VITALS — Temp 97.9°F

## 2019-05-10 DIAGNOSIS — H608X2 Other otitis externa, left ear: Secondary | ICD-10-CM | POA: Diagnosis not present

## 2019-05-10 NOTE — Progress Notes (Signed)
HPI: Lauren Levy is a 49 y.o. female who presents for evaluation of ear complaints.  She complains of mostly the left ear which is giving her trouble over the past 6 months.  She complains of chronic itching in the ear.  She also has some discomfort in the left ear when she swallows that goes down her neck.  But her main complaint seems to be more itching in the ear.  She has also noticed some white stuff out of her ear when she uses a Bobby pin in the ear.  She has tried hydrogen peroxide.  Past Medical History:  Diagnosis Date  . Ovarian cyst    No past surgical history on file. Social History   Socioeconomic History  . Marital status: Married    Spouse name: Not on file  . Number of children: Not on file  . Years of education: Not on file  . Highest education level: Not on file  Occupational History  . Not on file  Tobacco Use  . Smoking status: Current Every Day Smoker    Packs/day: 1.00    Years: 3.00    Pack years: 3.00  . Smokeless tobacco: Never Used  Substance and Sexual Activity  . Alcohol use: Yes    Comment: occ  . Drug use: No  . Sexual activity: Not on file  Other Topics Concern  . Not on file  Social History Narrative  . Not on file   Social Determinants of Health   Financial Resource Strain:   . Difficulty of Paying Living Expenses:   Food Insecurity:   . Worried About Charity fundraiser in the Last Year:   . Arboriculturist in the Last Year:   Transportation Needs:   . Film/video editor (Medical):   Marland Kitchen Lack of Transportation (Non-Medical):   Physical Activity:   . Days of Exercise per Week:   . Minutes of Exercise per Session:   Stress:   . Feeling of Stress :   Social Connections:   . Frequency of Communication with Friends and Family:   . Frequency of Social Gatherings with Friends and Family:   . Attends Religious Services:   . Active Member of Clubs or Organizations:   . Attends Archivist Meetings:   Marland Kitchen Marital Status:     Family History  Problem Relation Age of Onset  . Breast cancer Paternal Aunt    No Known Allergies Prior to Admission medications   Medication Sig Start Date End Date Taking? Authorizing Provider  metroNIDAZOLE (FLAGYL) 500 MG tablet Take 1 tablet (500 mg total) by mouth 2 (two) times daily. One po bid x 7 days 03/26/16   Street, [place], [place]     Positive ROS: Otherwise negative  All other systems have been reviewed and were otherwise negative with the exception of those mentioned in the HPI and as above.  Physical Exam: Constitutional: Alert, well-appearing, no acute distress Ears: External ears without lesions or tenderness.  On microscopic exam right ear canal right TM were normal.  Left TM had some slight crusting and scabbing in the ear canal but this was minimal and not obstructing.  TM was clear.  Hearing screening with a tuning forks demonstrated symmetric hearing with AC > BC bilaterally. Nasal: External nose without lesions. Septum midline.. Clear nasal passages with no signs of infection.  Minimal rhinitis. Oral: Lips and gums without lesions. Tongue and palate mucosa without lesions. Posterior oropharynx clear.  Patient is  status post tonsillectomy. Neck: No palpable adenopathy or masses.  No left neck adenopathy and no left neck tenderness on palpation. Respiratory: Breathing comfortably  Skin: No facial/neck lesions or rash noted.  Procedures  Assessment: Minimal left ear eczema  Plan: Recommended use of either Diprolene 0.05% cream twice daily to the left ear versus use of fluocinolone 0.01% oil eardrops for the left ear. Also discussed possible use of alcohol vinegar ear rinses.. She will follow-up as needed.  Radene Journey, MD

## 2019-05-17 ENCOUNTER — Ambulatory Visit: Payer: PRIVATE HEALTH INSURANCE | Attending: Internal Medicine

## 2019-05-17 DIAGNOSIS — Z23 Encounter for immunization: Secondary | ICD-10-CM

## 2019-05-17 NOTE — Progress Notes (Signed)
   Covid-19 Vaccination Clinic  Name:  Lauren Levy    MRN: [id number] DOB: 12/30/1970  05/17/2019  [name] was observed post Covid-19 immunization for 15 minutes without incident. She was provided with Vaccine Information Sheet and instruction to access the V-Safe system.   [name] was instructed to call 911 with any severe reactions post vaccine: Marland Kitchen Difficulty breathing  . Swelling of face and throat  . A fast heartbeat  . A bad rash all over body  . Dizziness and weakness   Immunizations Administered    Name Date Dose VIS Date Route   Pfizer COVID-19 Vaccine 05/17/2019 11:21 AM 0.3 mL 01/19/2019 Intramuscular   Manufacturer: [place]   Lot: [id number]   [place]: [id number]

## 2019-06-11 ENCOUNTER — Ambulatory Visit: Payer: PRIVATE HEALTH INSURANCE | Attending: Internal Medicine

## 2019-06-11 DIAGNOSIS — Z23 Encounter for immunization: Secondary | ICD-10-CM

## 2019-06-11 NOTE — Progress Notes (Signed)
   Covid-19 Vaccination Clinic  Name:  Lauren Levy    MRN: [id number] DOB: 1970/08/11  06/11/2019  Lauren Levy was observed post Covid-19 immunization for 15 minutes without incident. She was provided with Vaccine Information Sheet and instruction to access the V-Safe system.   Lauren Levy was instructed to call 911 with any severe reactions post vaccine: Marland Kitchen Difficulty breathing  . Swelling of face and throat  . A fast heartbeat  . A bad rash all over body  . Dizziness and weakness   Immunizations Administered    Name Date Dose VIS Date Route   Pfizer COVID-19 Vaccine 06/11/2019 10:24 AM 0.3 mL 04/04/2018 Intramuscular   Manufacturer: [place]   Lot: [id number]   [place]: [id number]

## 2020-04-02 ENCOUNTER — Ambulatory Visit: Payer: PRIVATE HEALTH INSURANCE | Attending: Internal Medicine

## 2020-04-02 ENCOUNTER — Other Ambulatory Visit: Payer: Self-pay

## 2020-04-02 DIAGNOSIS — Z23 Encounter for immunization: Secondary | ICD-10-CM

## 2020-04-02 NOTE — Progress Notes (Signed)
   Covid-19 Vaccination Clinic  Name:  Lauren Levy    MRN: [id number] DOB: 1970-10-18  04/02/2020  [name] was observed post Covid-19 immunization for 15 minutes without incident. She was provided with Vaccine Information Sheet and instruction to access the V-Safe system.   [name] was instructed to call 911 with any severe reactions post vaccine: Marland Kitchen Difficulty breathing  . Swelling of face and throat  . A fast heartbeat  . A bad rash all over body  . Dizziness and weakness   Immunizations Administered    Name Date Dose VIS Date Route   PFIZER Comrnaty(Gray TOP) Covid-19 Vaccine 04/02/2020  1:13 PM 0.3 mL 01/17/2020 Intramuscular   Manufacturer: [place]   Lot: [id number]   [facility]: [phone]

## 2020-07-21 ENCOUNTER — Other Ambulatory Visit: Payer: Self-pay | Admitting: Family Medicine

## 2020-07-21 ENCOUNTER — Other Ambulatory Visit: Payer: Self-pay

## 2020-07-21 ENCOUNTER — Ambulatory Visit
Admission: RE | Admit: 2020-07-21 | Discharge: 2020-07-21 | Disposition: A | Discharge status: Home / Self Care | Payer: No Typology Code available for payment source | Source: Ambulatory Visit | Attending: Family Medicine | Admitting: Family Medicine

## 2020-07-21 DIAGNOSIS — Z1231 Encounter for screening mammogram for malignant neoplasm of breast: Secondary | ICD-10-CM

## 2020-07-21 IMAGING — MG DIGITAL SCREENING BILAT W/ CAD
4 series · 4 of 4 positions shown · non-contrast
Comparison: Previous exam(s).

CLINICAL DATA: Screening.

EXAM:
DIGITAL SCREENING BILATERAL MAMMOGRAM WITH CAD
TECHNIQUE: Bilateral screening digital craniocaudal and mediolateral oblique
mammograms were obtained. The images were evaluated with
computer-aided detection.

[R MLO]
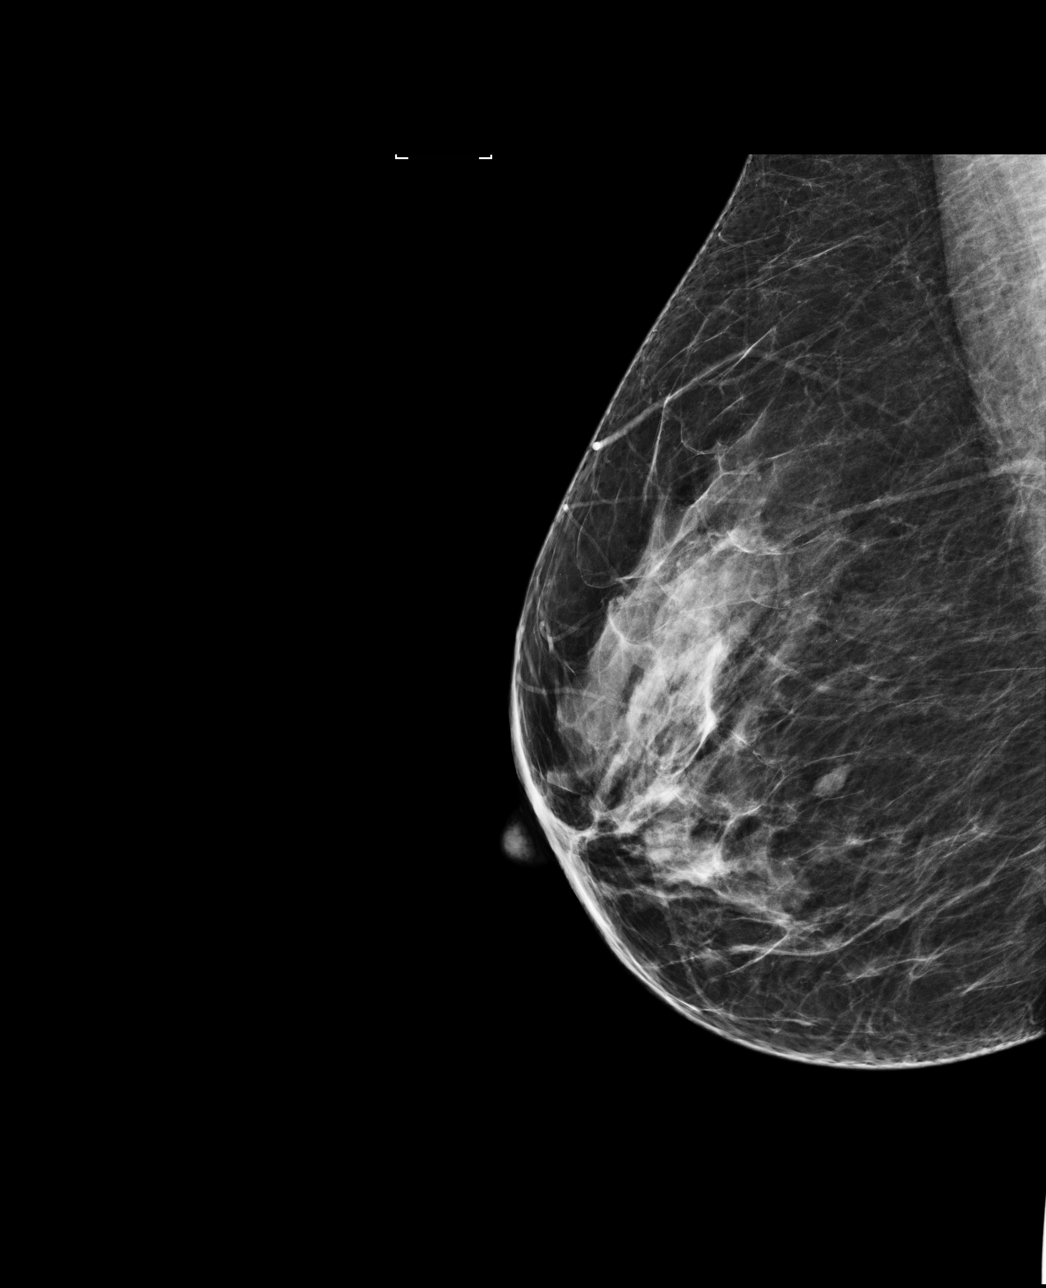

[L MLO]
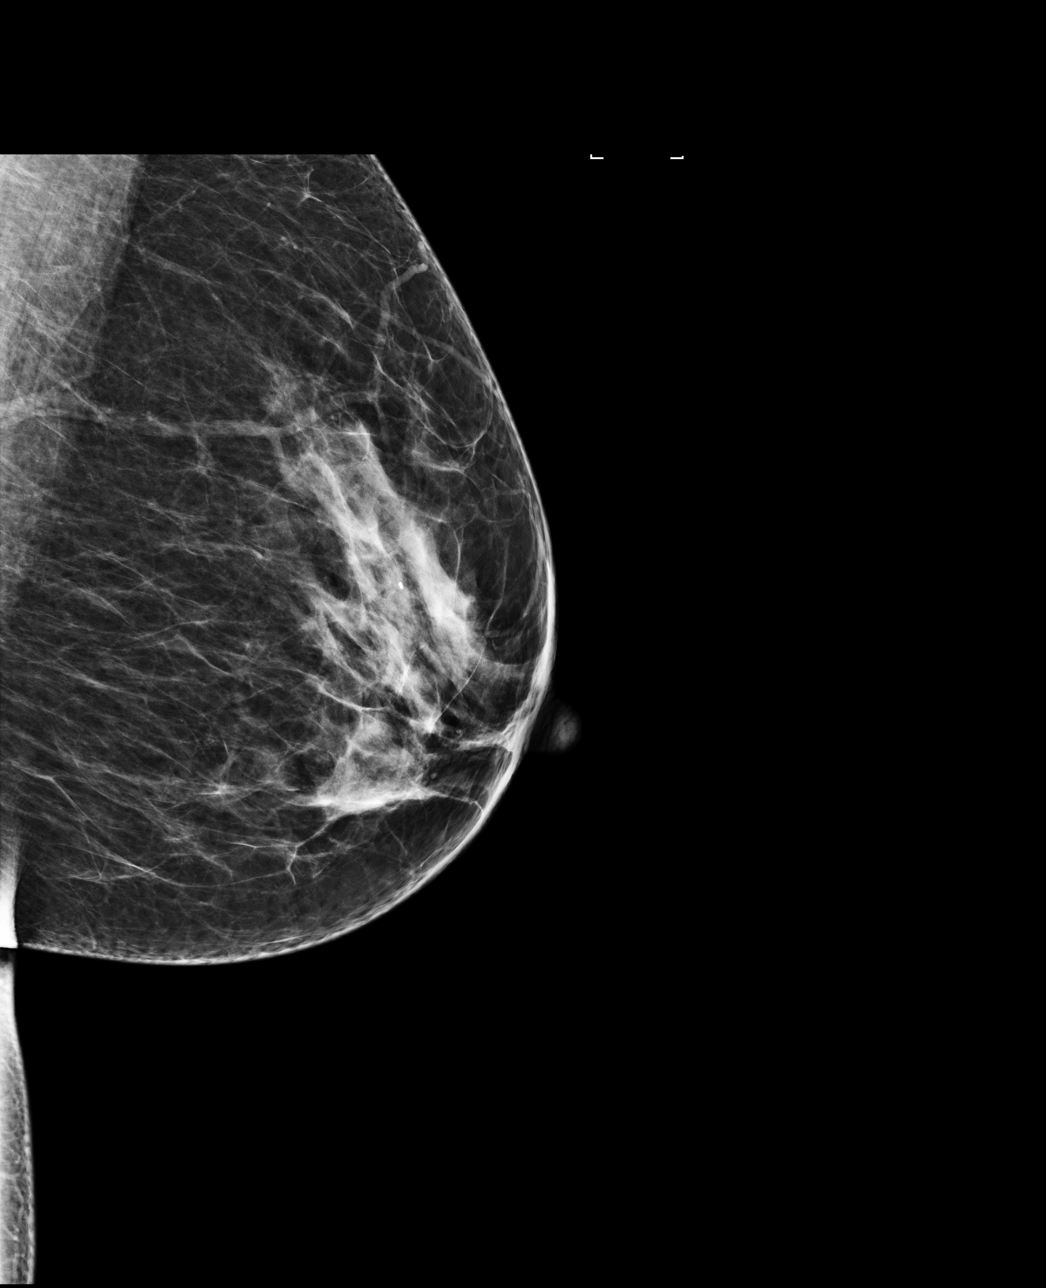

[L CC]
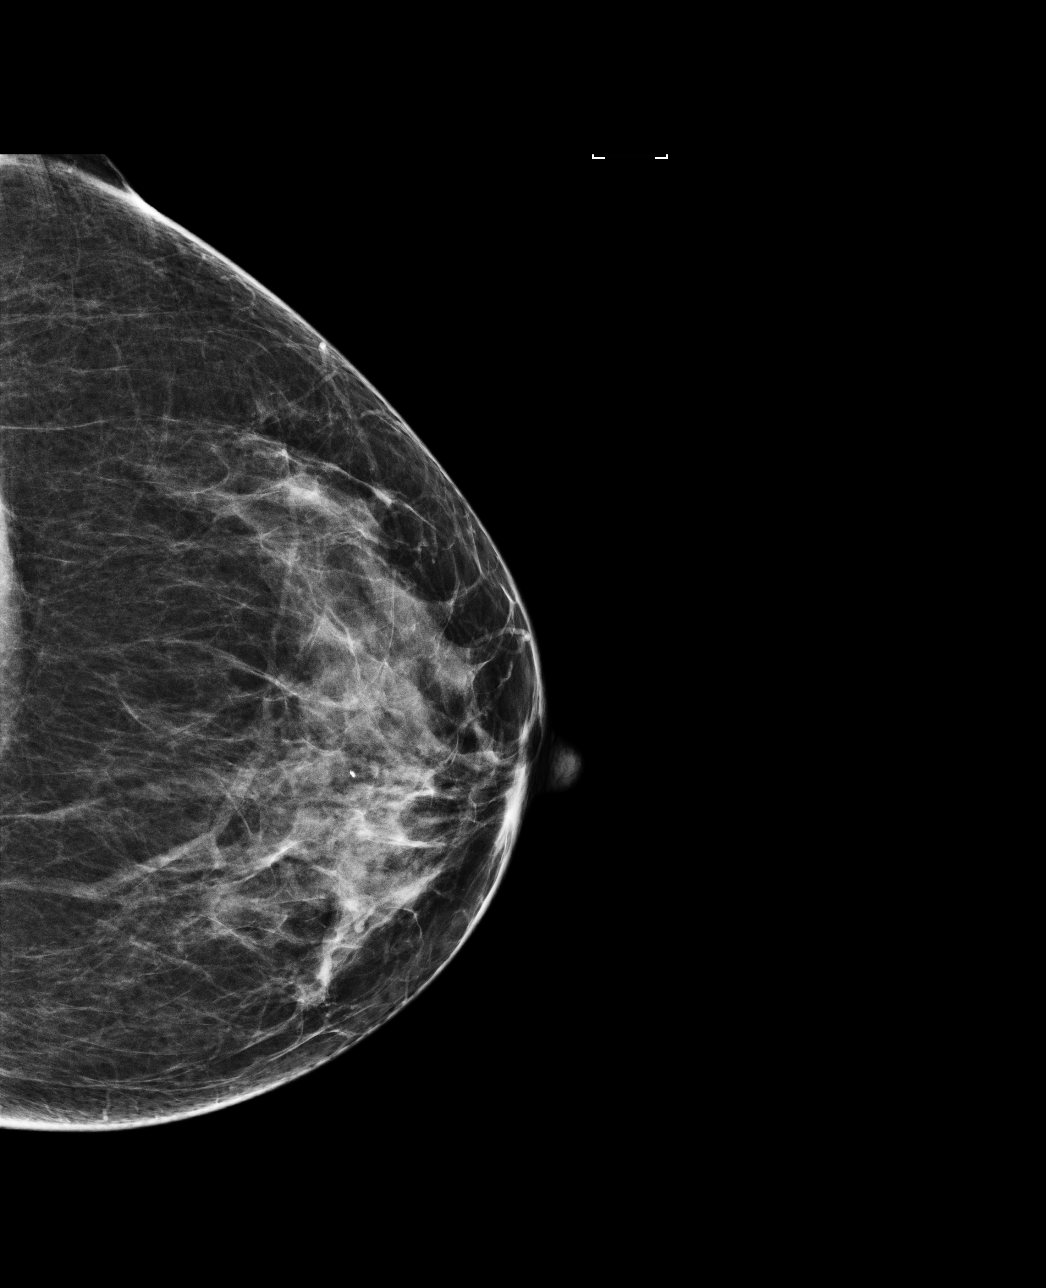

[R CC]
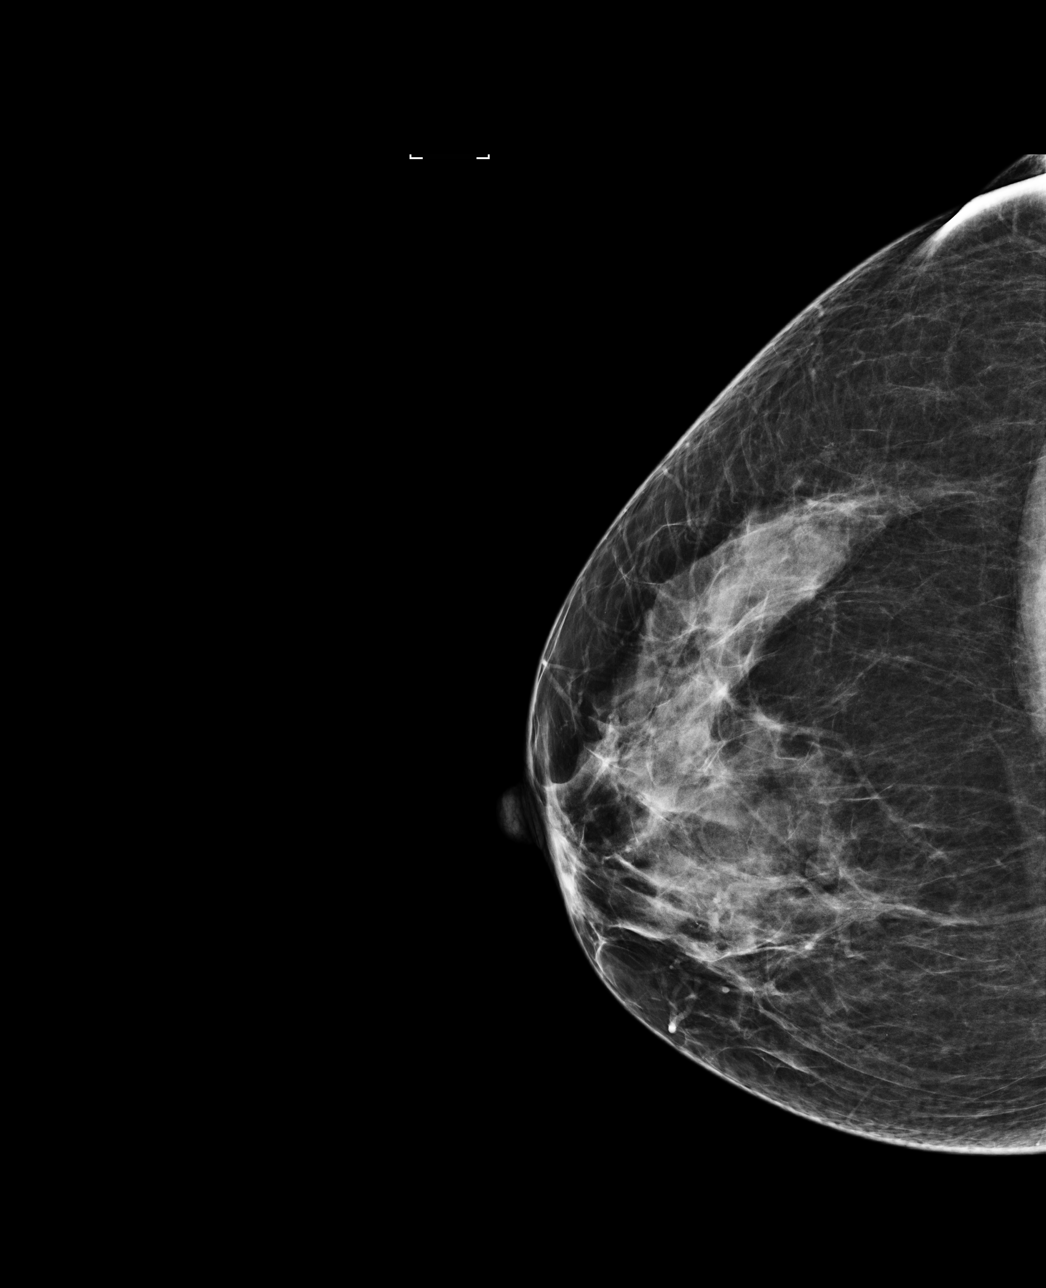

[4 of 4 positions shown; findings below may reference images not displayed]

ACR Breast Density Category c: The breast tissue is heterogeneously
dense, which may obscure small masses.
FINDINGS: In the right breast, a possible mass warrants further evaluation. In
the left breast, no findings suspicious for malignancy.
IMPRESSION: Further evaluation is suggested for a possible mass in the right
breast.

RECOMMENDATION:
Diagnostic mammogram and possibly ultrasound of the right breast.
(Code:[6E])

The patient will be contacted regarding the findings, and additional
imaging will be scheduled.

BI-RADS CATEGORY  0: Incomplete. Need additional imaging evaluation
and/or prior mammograms for comparison.

## 2020-07-25 ENCOUNTER — Other Ambulatory Visit: Payer: Self-pay | Admitting: Family Medicine

## 2020-07-25 DIAGNOSIS — R928 Other abnormal and inconclusive findings on diagnostic imaging of breast: Secondary | ICD-10-CM

## 2020-07-29 ENCOUNTER — Ambulatory Visit
Admission: RE | Admit: 2020-07-29 | Discharge: 2020-07-29 | Disposition: A | Discharge status: Home / Self Care | Payer: No Typology Code available for payment source | Source: Ambulatory Visit | Attending: Family Medicine | Admitting: Family Medicine

## 2020-07-29 ENCOUNTER — Other Ambulatory Visit: Payer: Self-pay | Admitting: Family Medicine

## 2020-07-29 ENCOUNTER — Other Ambulatory Visit: Payer: Self-pay

## 2020-07-29 ENCOUNTER — Ambulatory Visit
Admission: RE | Admit: 2020-07-29 | Discharge: 2020-07-29 | Disposition: A | Discharge status: Home / Self Care | Payer: Self-pay | Source: Ambulatory Visit | Attending: Family Medicine | Admitting: Family Medicine

## 2020-07-29 DIAGNOSIS — R928 Other abnormal and inconclusive findings on diagnostic imaging of breast: Secondary | ICD-10-CM

## 2020-07-29 IMAGING — MG MM DIGITAL DIAGNOSTIC UNILAT*R* W/ TOMO W/ CAD
4 series · 4 of 12 positions shown · non-contrast
Comparison: Previous exam(s).

CLINICAL DATA: Recall from 2D screening mammography, possible mass
involving the in inner breast at middle depth. Patient is currently
asymptomatic.

EXAM:
DIGITAL DIAGNOSTIC UNILATERAL RIGHT MAMMOGRAM WITH TOMOSYNTHESIS AND
CAD; ULTRASOUND RIGHT BREAST LIMITED
TECHNIQUE: Right digital diagnostic mammography and breast tomosynthesis was
performed. The images were evaluated with computer-aided detection.;
Targeted ultrasound examination of the right breast was performed

[R MLO synth-2D]
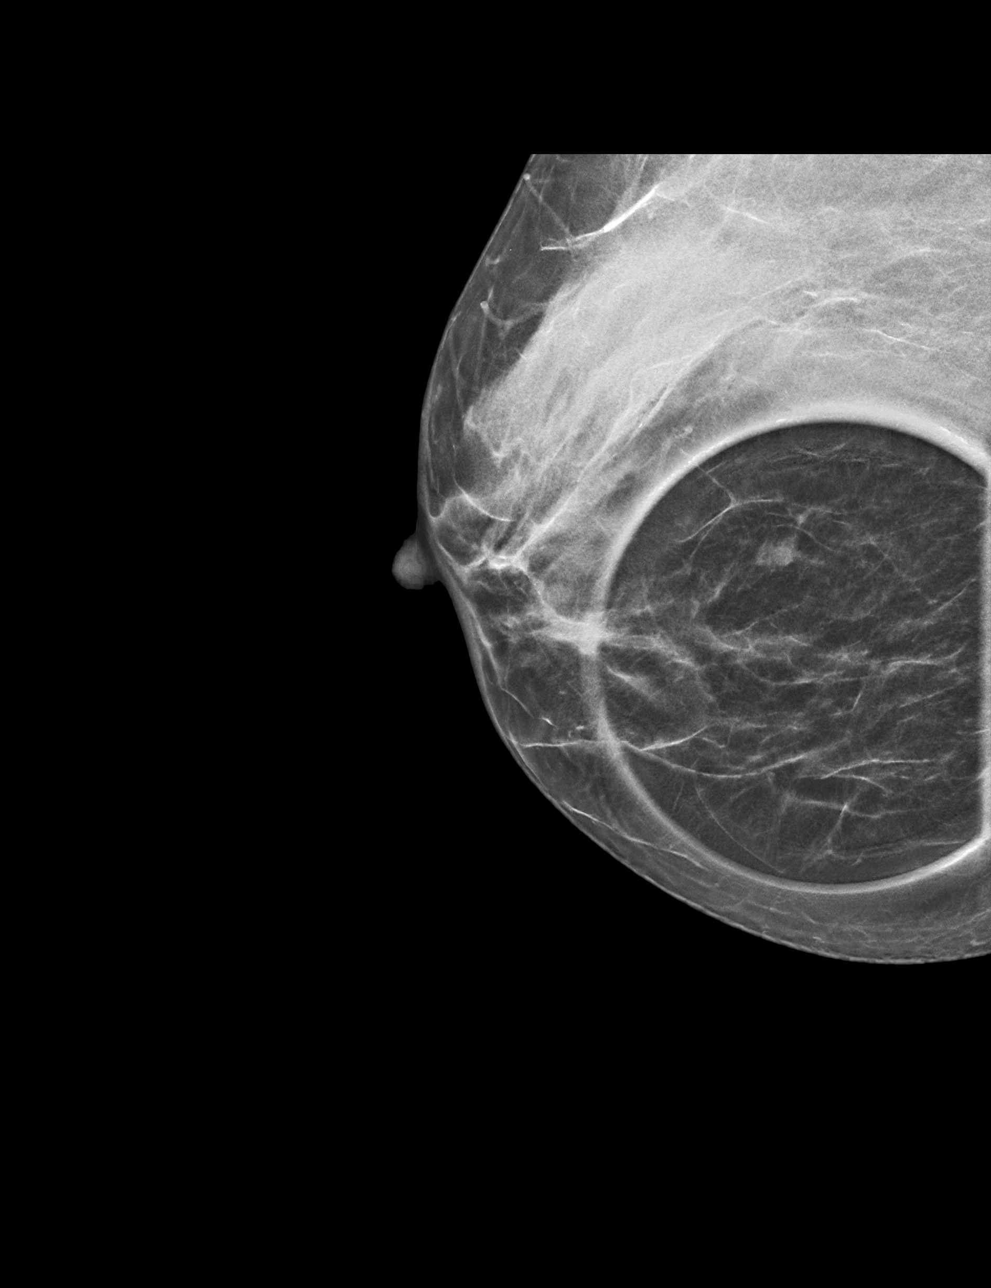

[R CC synth-2D]
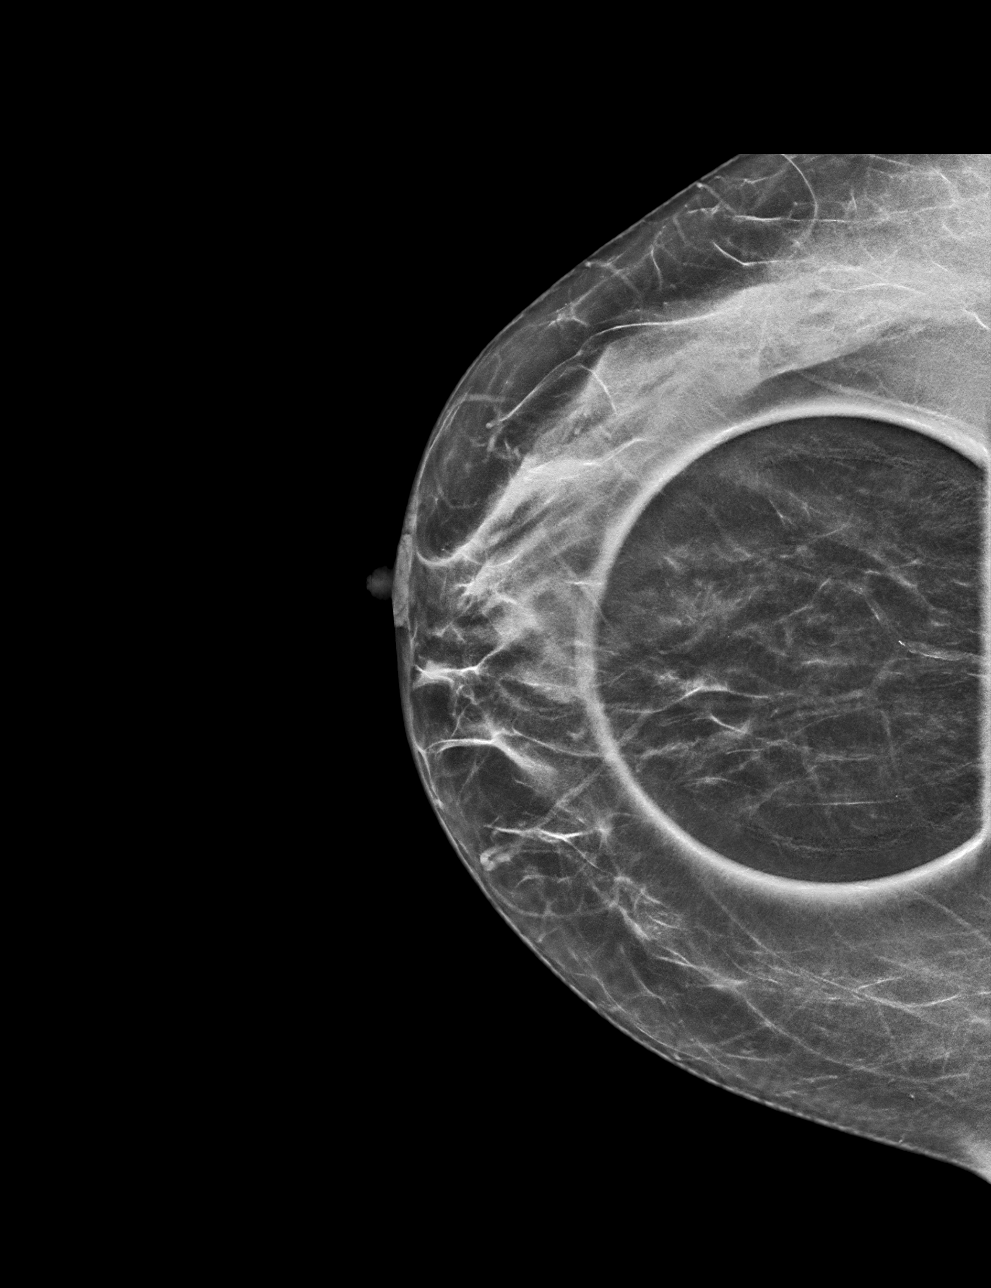

[R CC tomo · tomo slice 25/50.0]
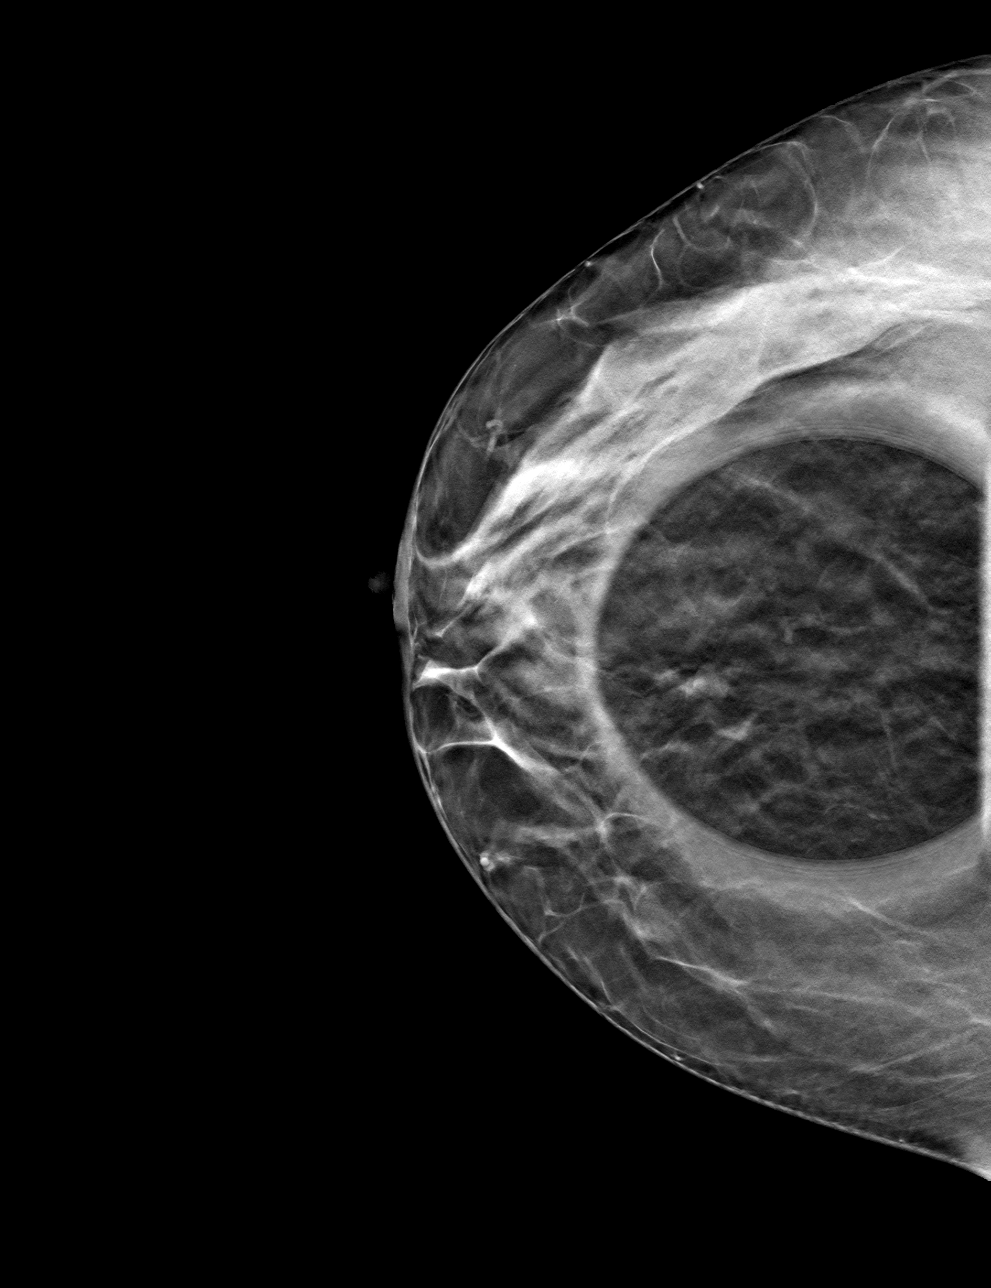

[R MLO tomo · tomo slice 23/44.0]
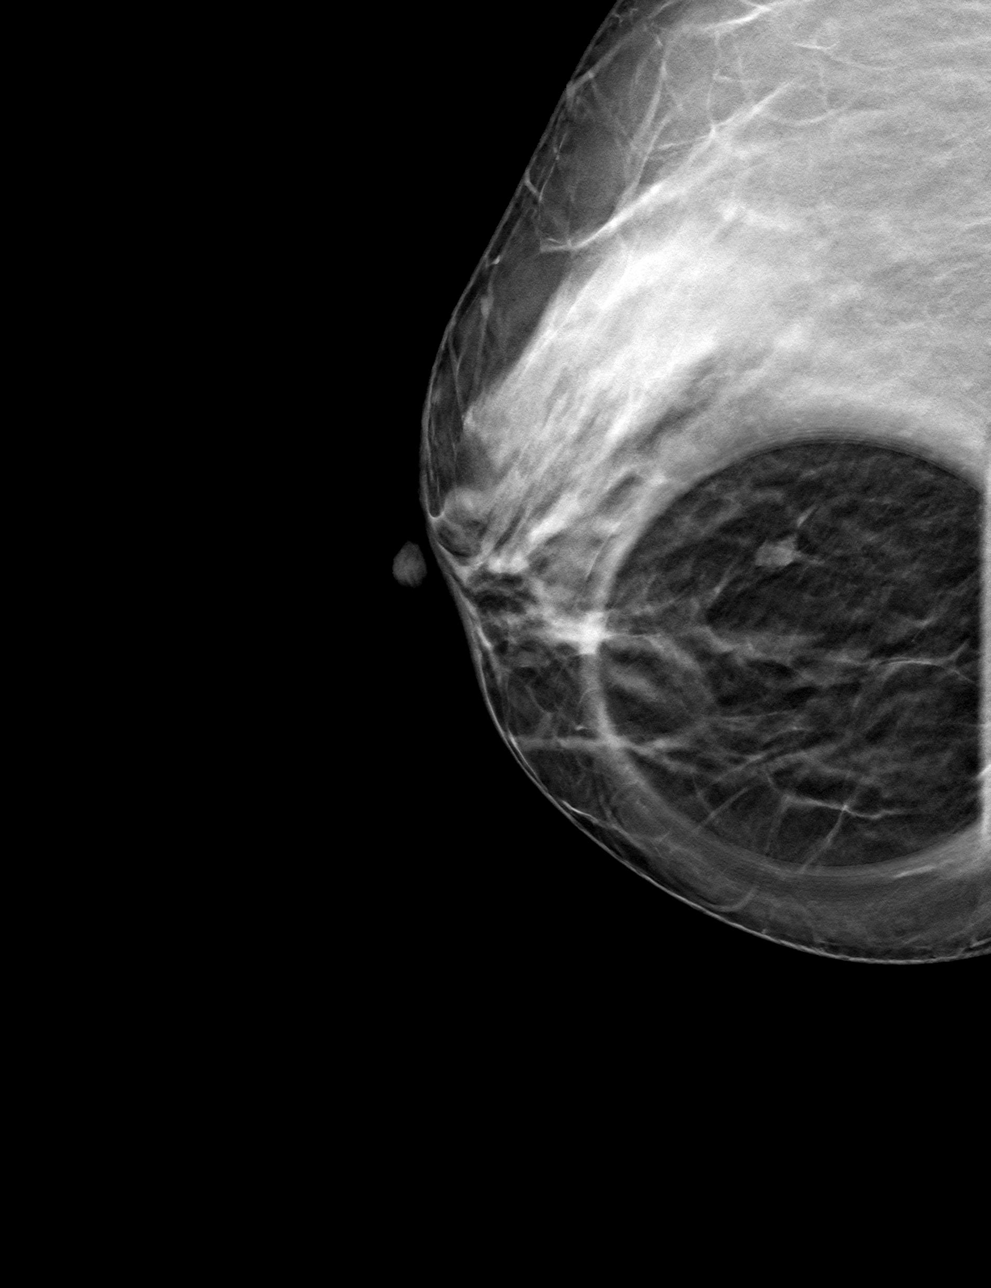

[4 of 12 positions shown; findings below may reference images not displayed]

ACR Breast Density Category c: The breast tissue is heterogeneously
dense, which may obscure small masses.
FINDINGS: Spot-compression CC and MLO views of the area of concern were
obtained.

These confirm a circumscribed isodense mass measuring approximately
7 mm in the inner breast at middle depth. There is no associated
architectural distortion or suspicious calcifications.

Targeted ultrasound is performed, demonstrating a benign simple cyst
at the 3 o'clock subareolar location at anterior to middle depth,
measuring approximately 7 x 2 x 6 mm, demonstrating posterior
acoustic enhancement and no internal power Doppler flow,
corresponding to the screening mammographic finding.

Incidental note is made of hypoechoic material within a normal
caliber duct directly behind the nipple that does not demonstrate
power Doppler flow, and is completely surrounded by fluid, therefore
most likely clumped debris within the duct.
IMPRESSION: 1. Benign 7 mm simple cyst in the inner RIGHT breast at the 3
o'clock subareolar location which accounts for the screening
mammographic finding.
2. Likely clumped debris within a normal caliber duct directly
behind the RIGHT nipple.

RECOMMENDATION:
RIGHT breast ultrasound in 6 months to re-evaluate the likely benign
clump debris within the duct directly behind the nipple.

I discussed with the patient the fact that I believe this is clumped
debris rather than a papilloma, given the absence of internal power
Doppler flow. However, I counseled her that if she were to develop a
discharge from the RIGHT nipple, she should contact the [REDACTED] and schedule an ultrasound-guided core
needle biopsy.

I have discussed the findings and recommendations with the patient.
If applicable, a reminder letter will be sent to the patient
regarding the next appointment.

BI-RADS CATEGORY  3: Probably benign.

## 2020-07-29 IMAGING — US US BREAST*R* LIMITED INC AXILLA
1 series · 13 of 15 positions shown · non-contrast
Comparison: Previous exam(s).

CLINICAL DATA: Recall from 2D screening mammography, possible mass
involving the in inner breast at middle depth. Patient is currently
asymptomatic.

EXAM:
DIGITAL DIAGNOSTIC UNILATERAL RIGHT MAMMOGRAM WITH TOMOSYNTHESIS AND
CAD; ULTRASOUND RIGHT BREAST LIMITED
TECHNIQUE: Right digital diagnostic mammography and breast tomosynthesis was
performed. The images were evaluated with computer-aided detection.;
Targeted ultrasound examination of the right breast was performed

[Series 1: us breast*right* limited inc axilla · 0.06mm/px · 13 of 15 slices shown]
[im 1/15]
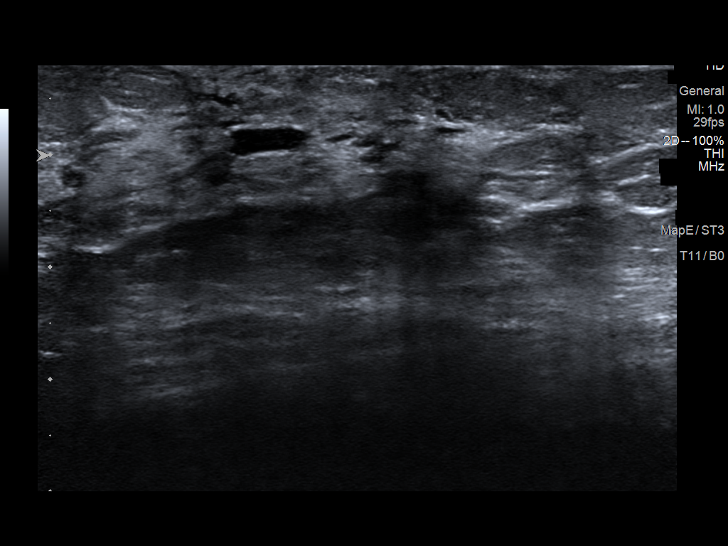
[im 2/15]
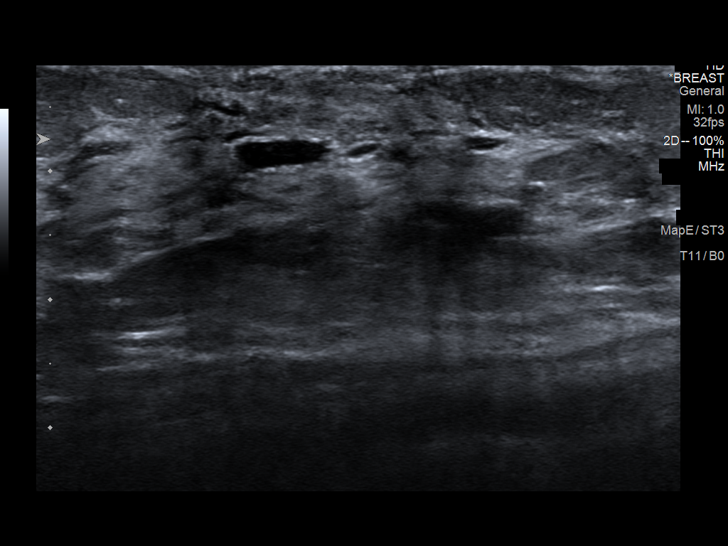
[im 3/15]
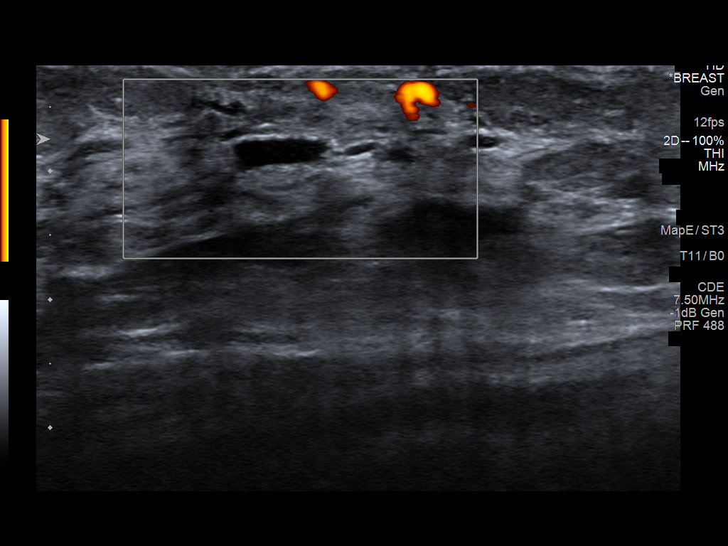
[im 5/15]
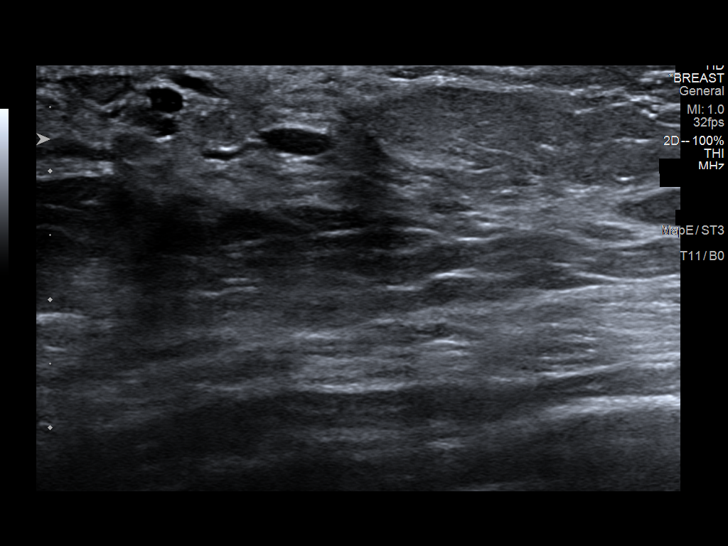
[im 6/15]
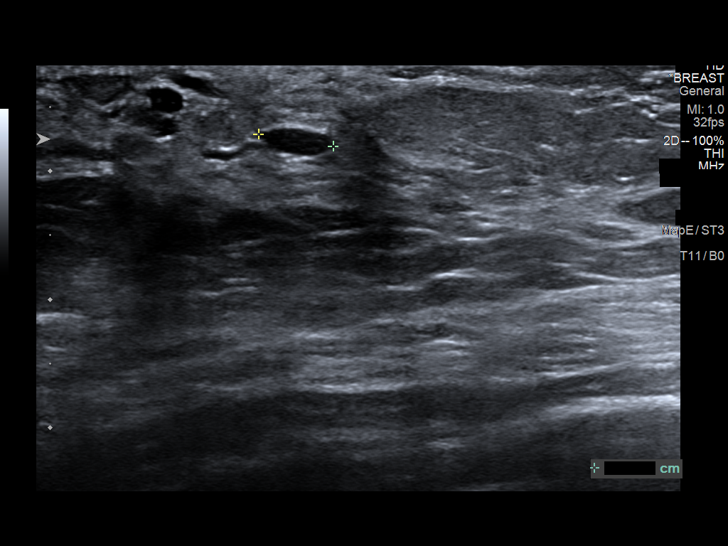
[im 7/15]
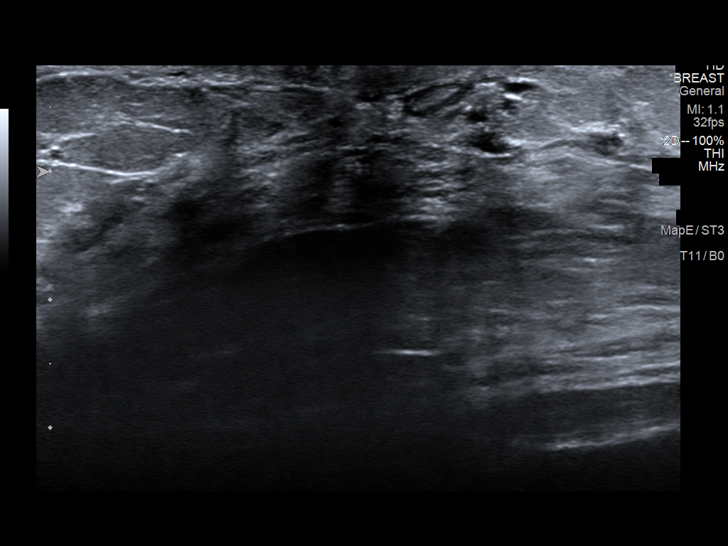
[im 8/15]
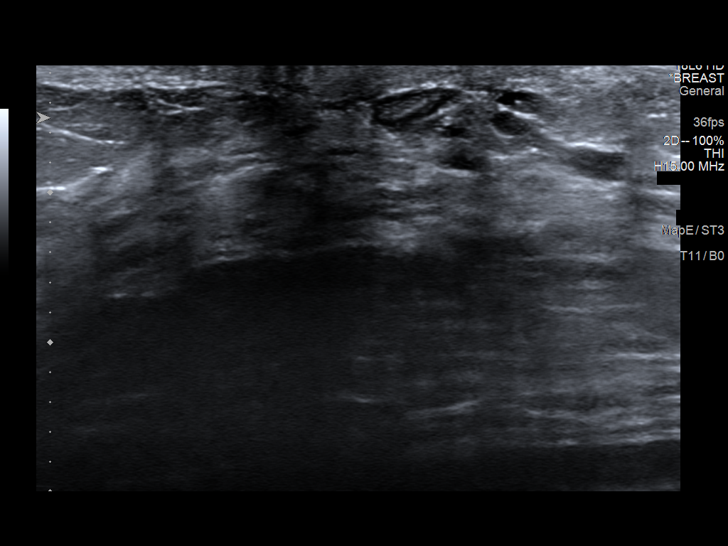
[im 9/15]
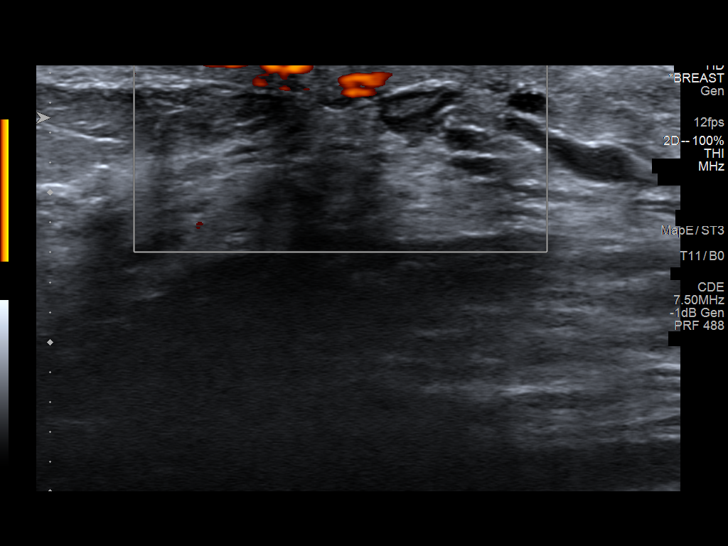
[im 10/15]
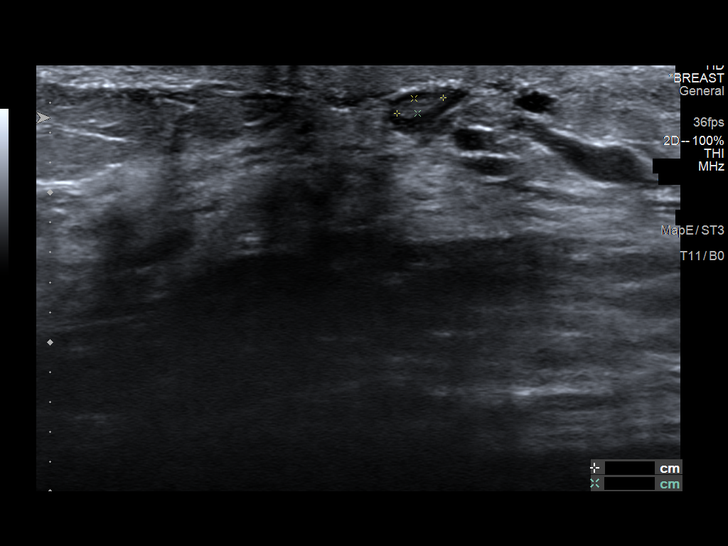
[im 11/15]
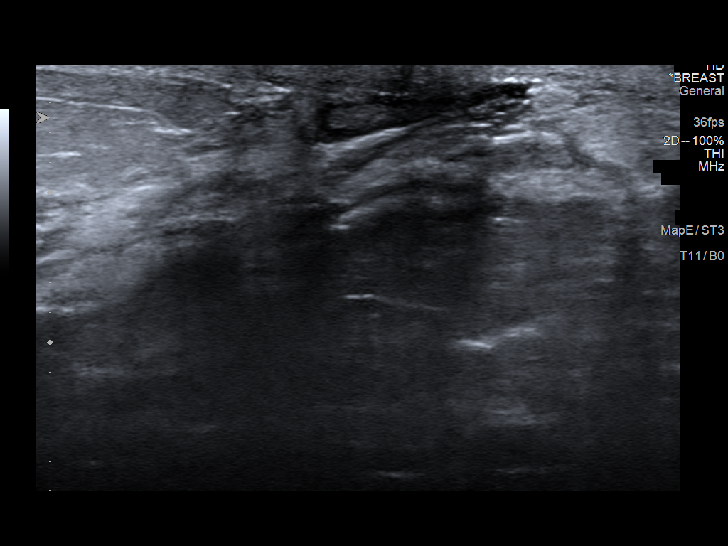
[im 13/15]
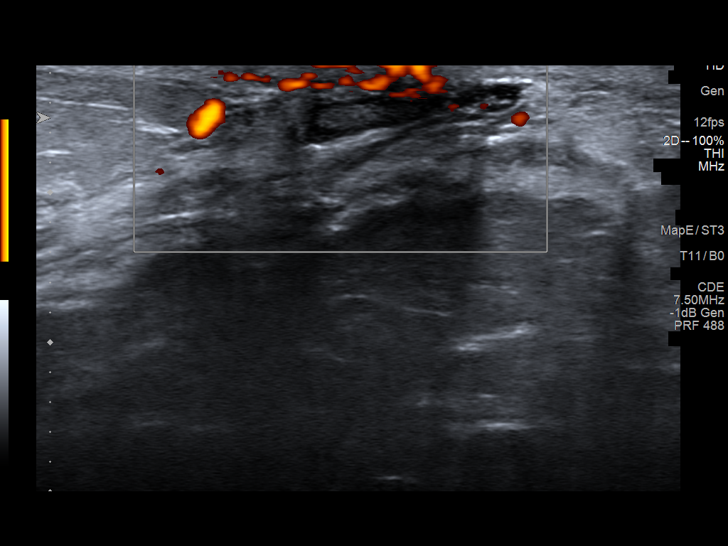
[im 14/15]
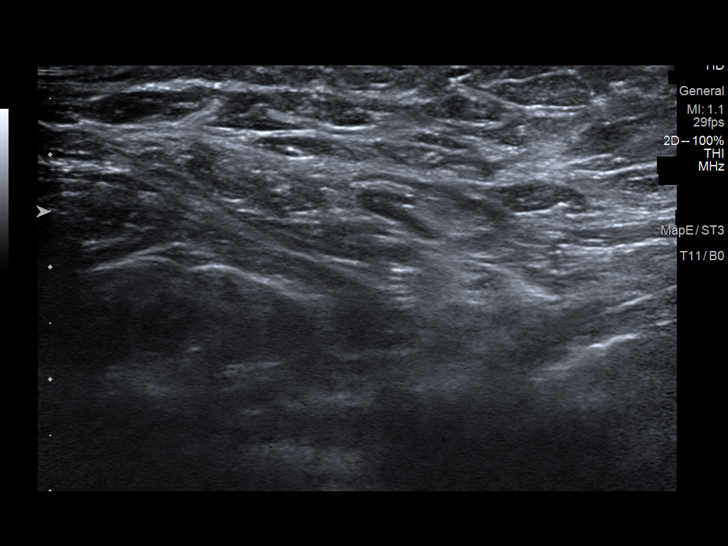
[im 15/15]
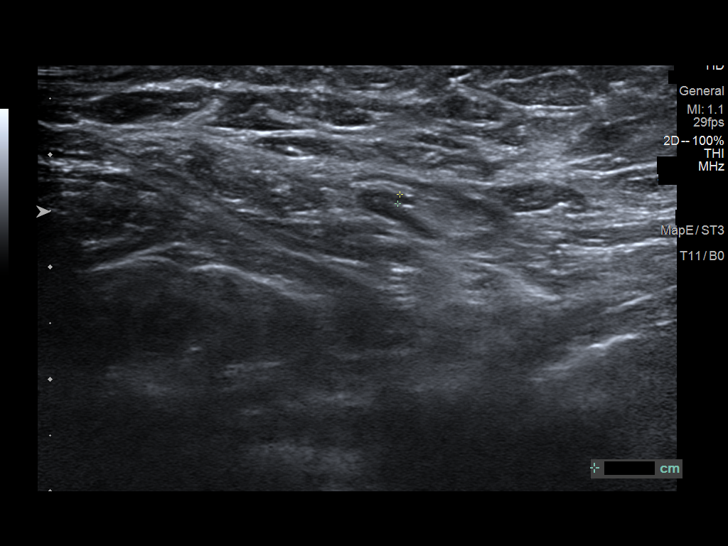

[13 of 15 positions shown; findings below may reference images not displayed]

ACR Breast Density Category c: The breast tissue is heterogeneously
dense, which may obscure small masses.
FINDINGS: Spot-compression CC and MLO views of the area of concern were
obtained.

These confirm a circumscribed isodense mass measuring approximately
7 mm in the inner breast at middle depth. There is no associated
architectural distortion or suspicious calcifications.

Targeted ultrasound is performed, demonstrating a benign simple cyst
at the 3 o'clock subareolar location at anterior to middle depth,
measuring approximately 7 x 2 x 6 mm, demonstrating posterior
acoustic enhancement and no internal power Doppler flow,
corresponding to the screening mammographic finding.

Incidental note is made of hypoechoic material within a normal
caliber duct directly behind the nipple that does not demonstrate
power Doppler flow, and is completely surrounded by fluid, therefore
most likely clumped debris within the duct.
IMPRESSION: 1. Benign 7 mm simple cyst in the inner RIGHT breast at the 3
o'clock subareolar location which accounts for the screening
mammographic finding.
2. Likely clumped debris within a normal caliber duct directly
behind the RIGHT nipple.

RECOMMENDATION:
RIGHT breast ultrasound in 6 months to re-evaluate the likely benign
clump debris within the duct directly behind the nipple.

I discussed with the patient the fact that I believe this is clumped
debris rather than a papilloma, given the absence of internal power
Doppler flow. However, I counseled her that if she were to develop a
discharge from the RIGHT nipple, she should contact the [REDACTED] and schedule an ultrasound-guided core
needle biopsy.

I have discussed the findings and recommendations with the patient.
If applicable, a reminder letter will be sent to the patient
regarding the next appointment.

BI-RADS CATEGORY  3: Probably benign.

## 2020-08-15 ENCOUNTER — Other Ambulatory Visit (HOSPITAL_COMMUNITY): Payer: Self-pay | Admitting: Neurological Surgery

## 2020-08-15 DIAGNOSIS — M21371 Foot drop, right foot: Secondary | ICD-10-CM

## 2020-08-21 ENCOUNTER — Other Ambulatory Visit: Payer: Self-pay | Admitting: Neurological Surgery

## 2020-08-21 DIAGNOSIS — M21371 Foot drop, right foot: Secondary | ICD-10-CM

## 2020-08-22 ENCOUNTER — Ambulatory Visit (HOSPITAL_COMMUNITY): Payer: Self-pay

## 2020-08-22 ENCOUNTER — Other Ambulatory Visit: Payer: Self-pay

## 2020-08-22 ENCOUNTER — Ambulatory Visit
Admission: RE | Admit: 2020-08-22 | Discharge: 2020-08-22 | Disposition: A | Discharge status: Home / Self Care | Payer: No Typology Code available for payment source | Source: Ambulatory Visit | Attending: Neurological Surgery | Admitting: Neurological Surgery

## 2020-08-22 ENCOUNTER — Encounter (HOSPITAL_COMMUNITY): Payer: Self-pay

## 2020-08-22 DIAGNOSIS — M21371 Foot drop, right foot: Secondary | ICD-10-CM

## 2020-08-22 IMAGING — MR MR LUMBAR SPINE W/O CM
4 of 5 series · 19 of 48 positions shown · non-contrast
Comparison: Lumbar MRI [DATE]

CLINICAL DATA: Low back pain.  Right foot drop.

EXAM:
MRI LUMBAR SPINE WITHOUT CONTRAST
TECHNIQUE: Multiplanar, multisequence MR imaging of the lumbar spine was
performed. No intravenous contrast was administered.

[Series 5: T2 · sagittal · 4.0mm · 0.73mm/px · 6 of 15 slices shown (1 of 2)]
[im 1/15]
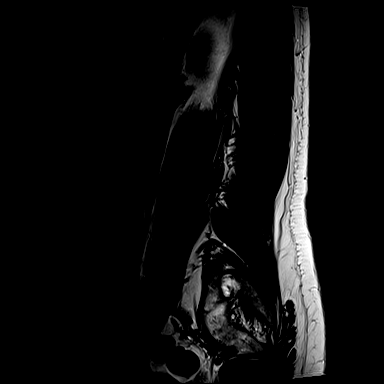
[im 3/15]
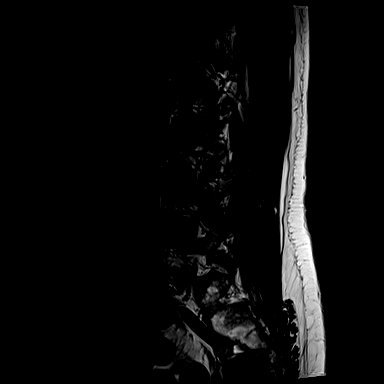
[im 6/15]
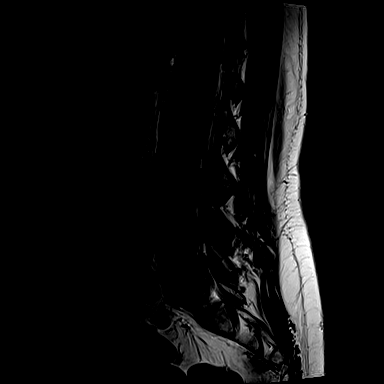
[im 9/15]
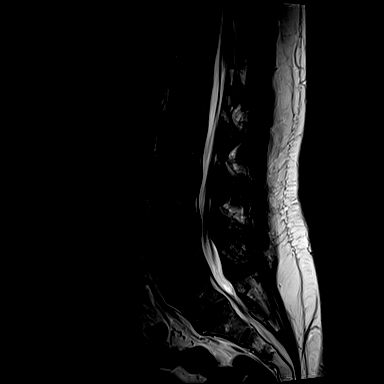
[im 12/15]
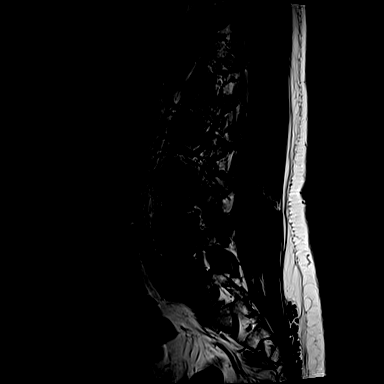
[im 15/15]
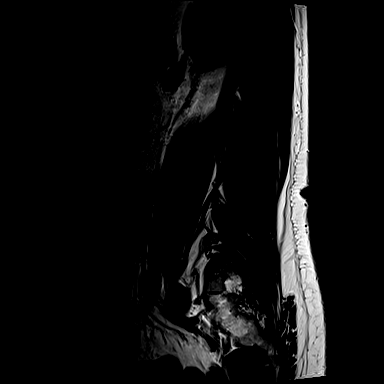

[Series 6: T1 · sagittal · 4.0mm · 0.88mm/px · 3 of 15 slices shown (1 of 2)]
[im 3/15]
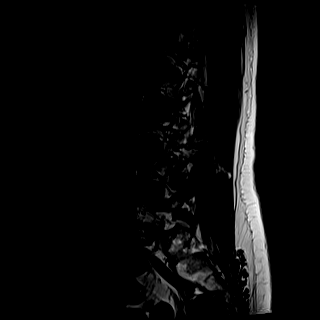
[im 9/15]
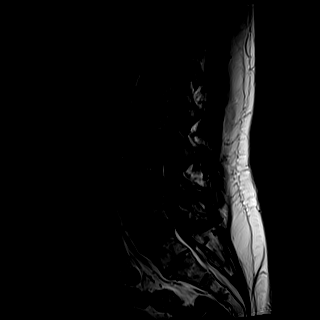
[im 15/15]
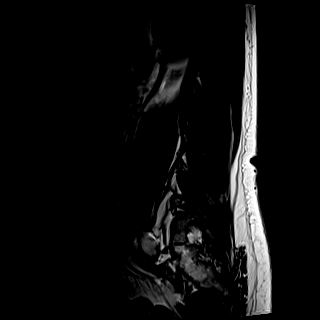

[Series 10: T1 · axial · 4.0mm · 0.28mm/px · z∈[-116,+49]mm · 3 of 39 slices shown (2 of 2)]
[im 6/39]
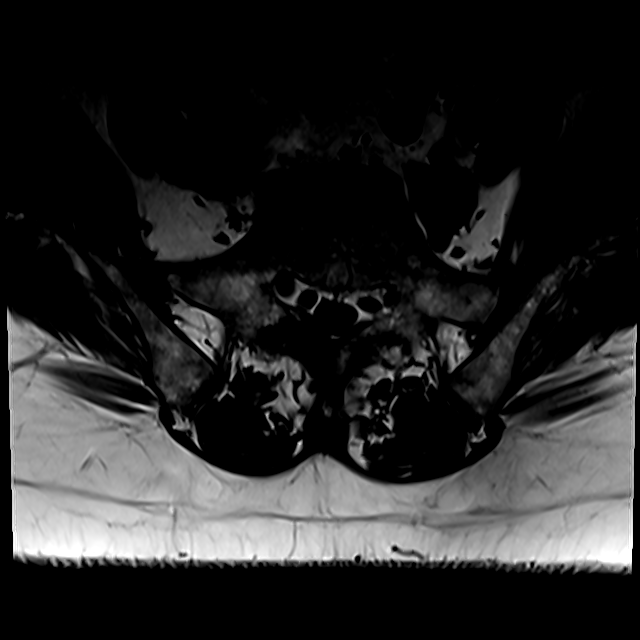
[im 20/39]
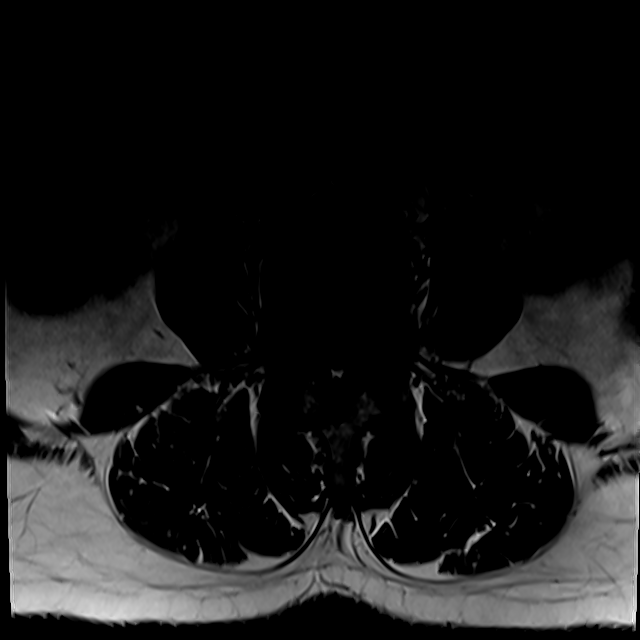
[im 33/39]
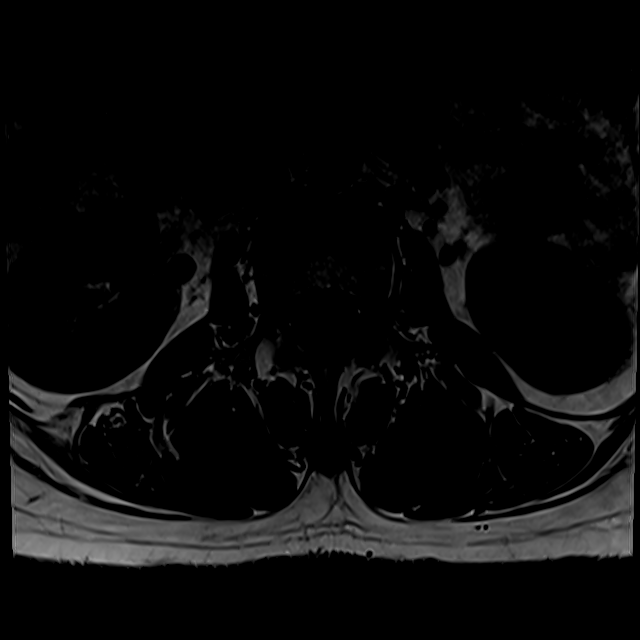

[Series 13: T2 · axial · 4.0mm · 0.28mm/px · z∈[-140,+49]mm · 7 of 39 slices shown (2 of 2)]
[im 1/39]
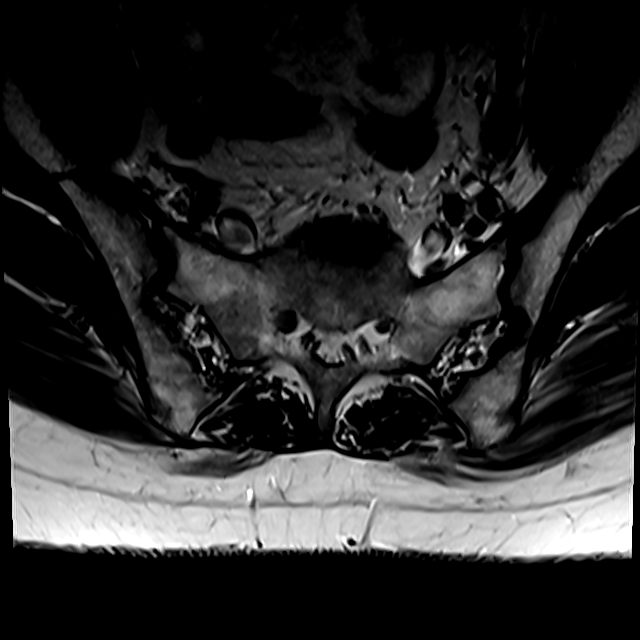
[im 6/39]
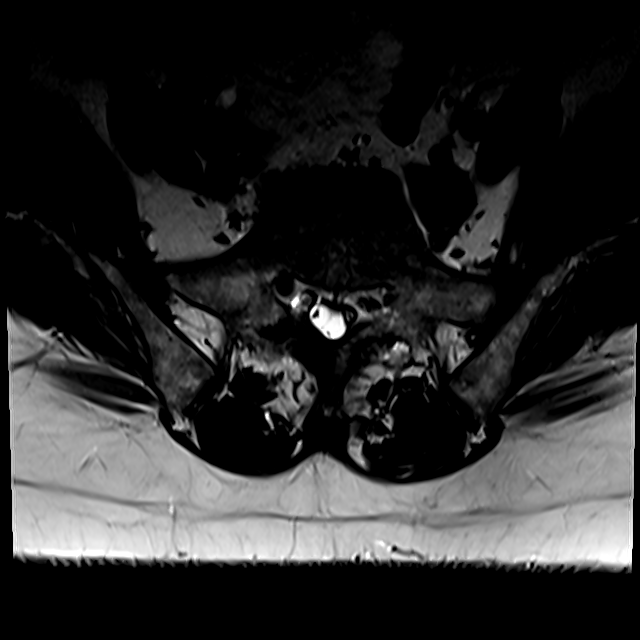
[im 11/39]
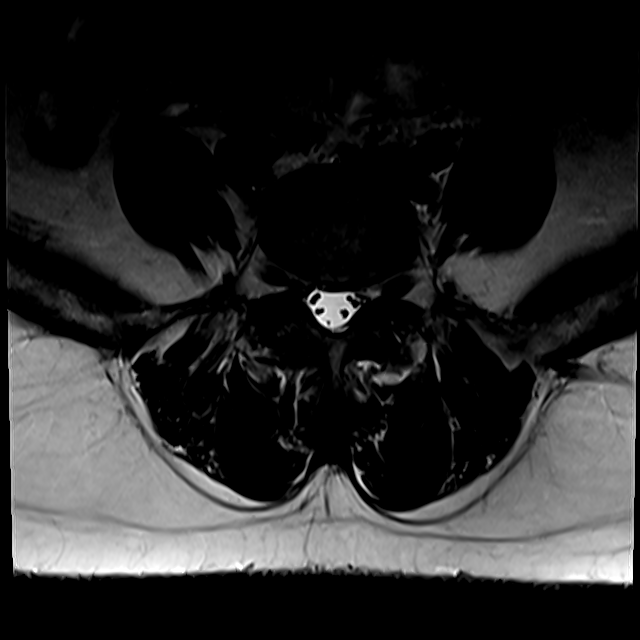
[im 17/39]
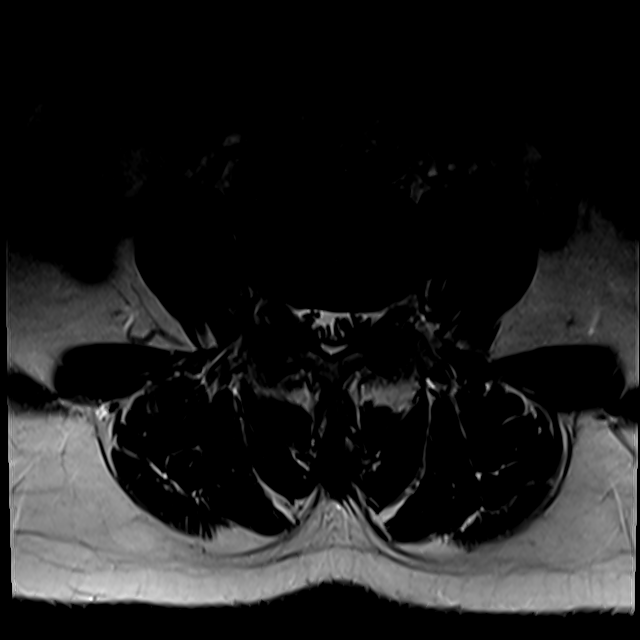
[im 20/39]
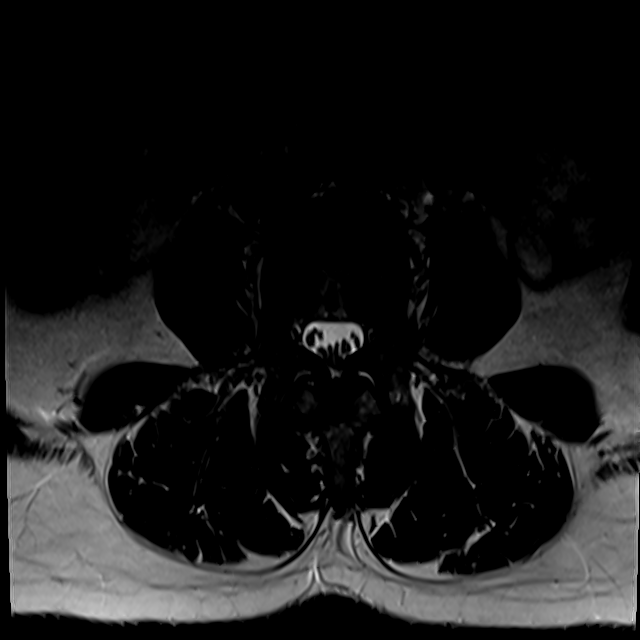
[im 22/39]
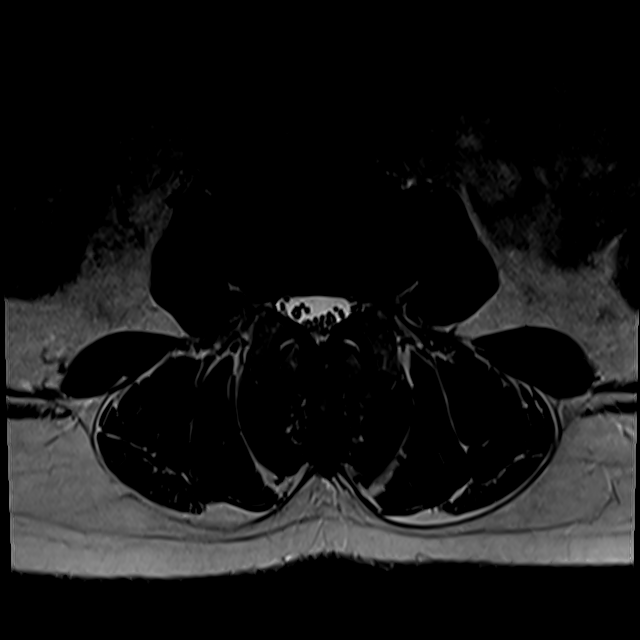
[im 33/39]
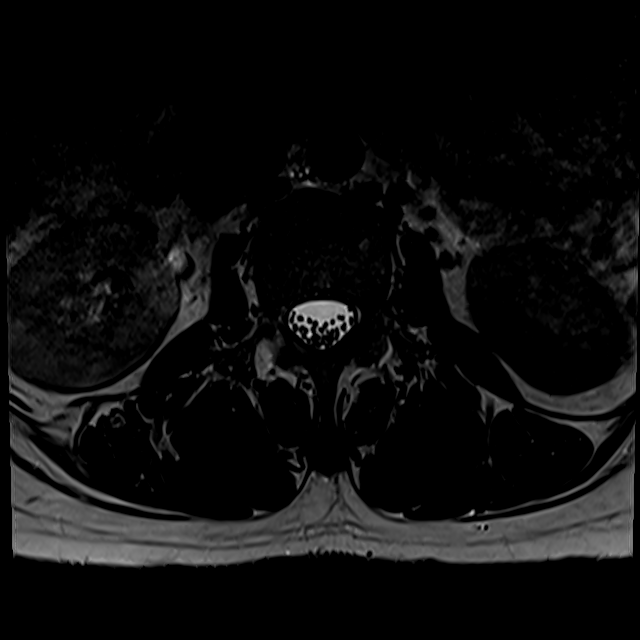

[19 of 48 positions shown; findings below may reference images not displayed]

FINDINGS: Segmentation: S1 is a transitional vertebra. Hypoplastic S1-2 disc.
Numbering consistent with the prior MRI report.

Alignment:  Normal

Vertebrae:  Negative for fracture or mass.  Normal bone marrow.

Conus medullaris and cauda equina: Conus extends to the L1-2 level.
Conus and cauda equina appear normal.

Paraspinal and other soft tissues: Negative for paraspinous mass or
adenopathy.

Disc levels:

L1-2: Negative

L2-3: Negative

L3-4: Mild disc and mild facet degeneration. Negative for spinal or
foraminal stenosis

L4-5: Diffuse disc bulging and moderate facet and ligamentum flavum
hypertrophy. Mild to moderate spinal stenosis shows significant
interval improvement since the prior study. Mild to moderate
subarticular stenosis left greater than right.

L5-S1: Mild disc degeneration. Shallow central disc protrusion. Mild
subarticular stenosis bilaterally unchanged.
IMPRESSION: Transitional S1 vertebra again noted

Disc and facet degeneration L4-5. There has been significant
improvement in spinal stenosis at this level compared to the prior
study. There is subarticular stenosis left greater than right

Mild subarticular stenosis bilaterally L5-S1 unchanged.

## 2020-09-16 ENCOUNTER — Other Ambulatory Visit: Payer: Self-pay | Admitting: Neurological Surgery

## 2020-09-16 DIAGNOSIS — M21371 Foot drop, right foot: Secondary | ICD-10-CM

## 2020-09-23 ENCOUNTER — Ambulatory Visit
Admission: RE | Admit: 2020-09-23 | Discharge: 2020-09-23 | Disposition: A | Discharge status: Home / Self Care | Payer: No Typology Code available for payment source | Source: Ambulatory Visit | Attending: Neurological Surgery | Admitting: Neurological Surgery

## 2020-09-23 DIAGNOSIS — M21371 Foot drop, right foot: Secondary | ICD-10-CM

## 2020-09-23 IMAGING — MR MR THORACIC SPINE W/O CM
4 of 6 series · 18 of 48 positions shown · non-contrast
Comparison: None.

CLINICAL DATA: Right foot drop, right leg weakness.

EXAM:
MRI CERVICAL AND THORACIC SPINE WITHOUT CONTRAST
TECHNIQUE: Multiplanar and multiecho pulse sequences of the cervical spine, to
include the craniocervical junction and cervicothoracic junction,
and the thoracic spine, were obtained without intravenous contrast.

[Series 3: T2 · sagittal · 4.0mm · 0.55mm/px · 5 of 17 slices shown (1 of 3)]
[im 1/17]
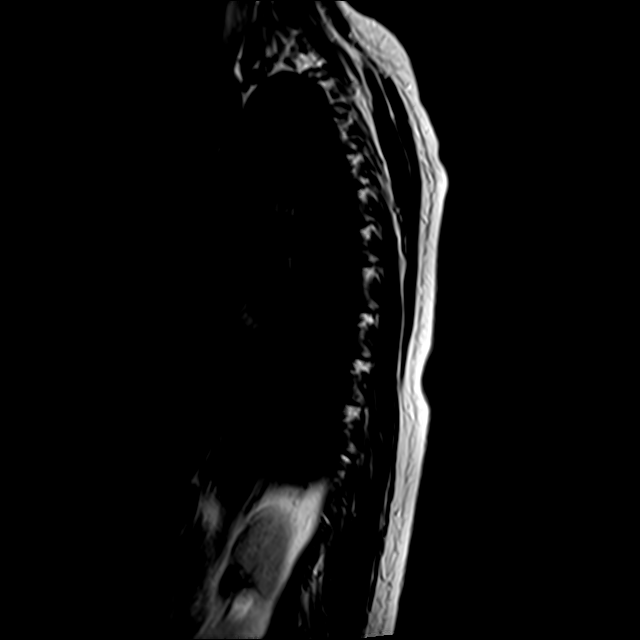
[im 5/17]
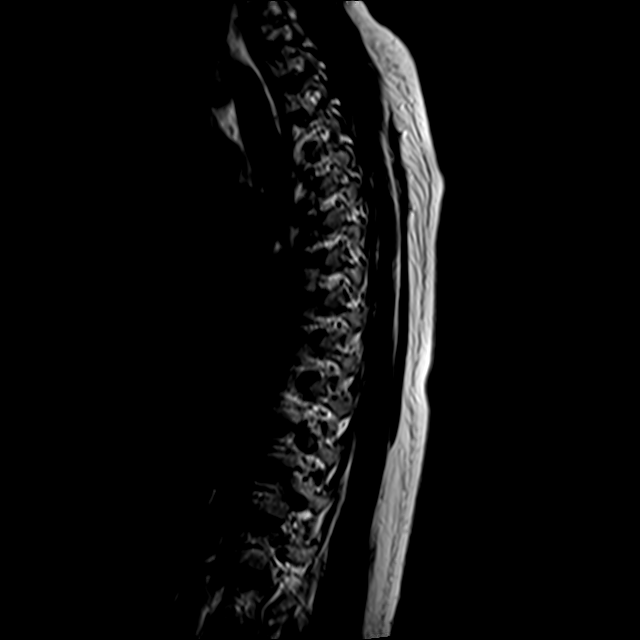
[im 9/17]
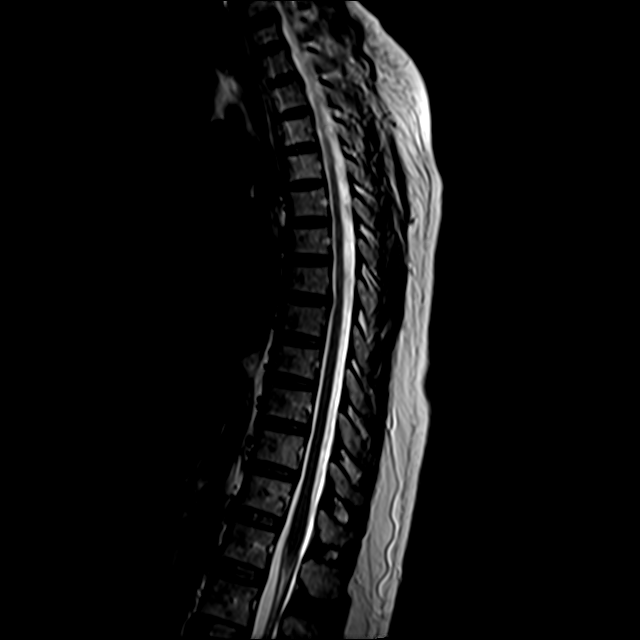
[im 13/17]
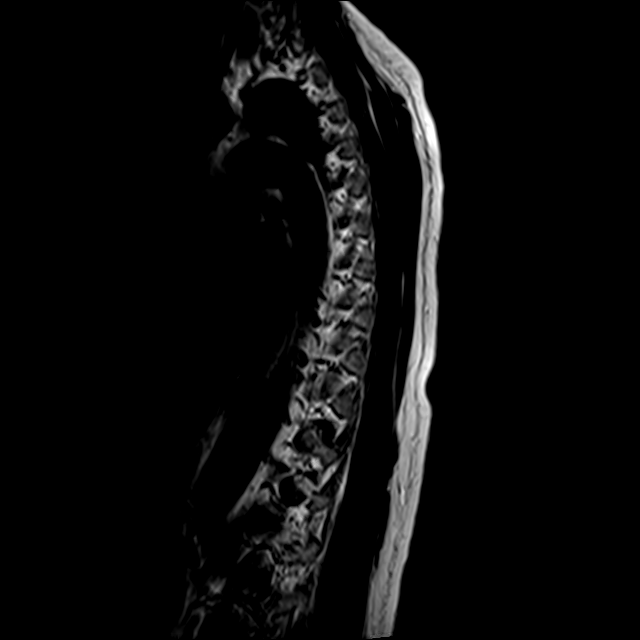
[im 17/17]
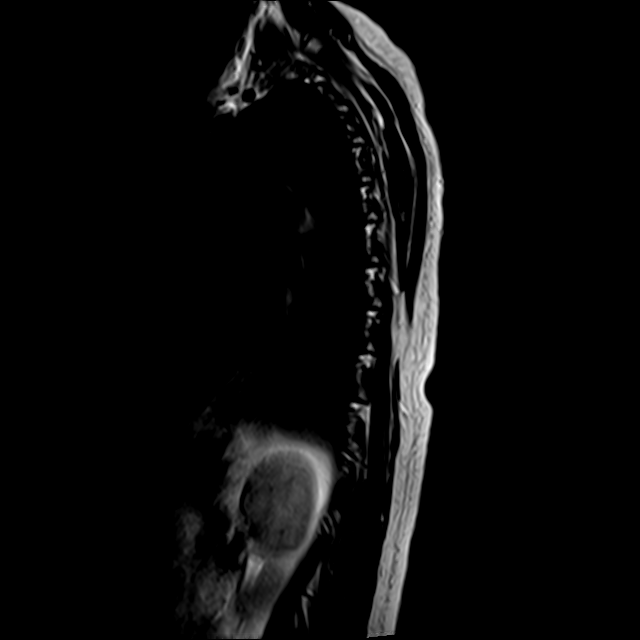

[Series 6: T1 · sagittal · 4.0mm · 1.09mm/px · 3 of 17 slices shown]
[im 4/17]
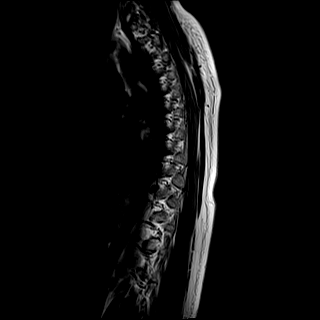
[im 10/17]
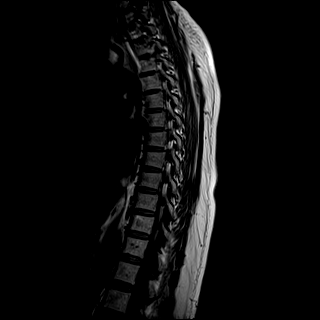
[im 17/17]
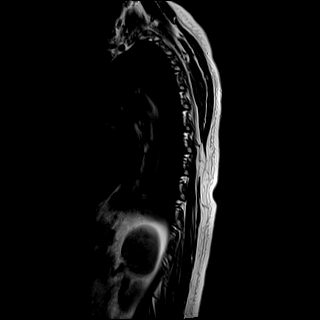

[Series 7: T2 · axial · 4.0mm · 0.39mm/px · z∈[-395,-200]mm · 7 of 39 slices shown (2 of 3)]
[im 1/39]
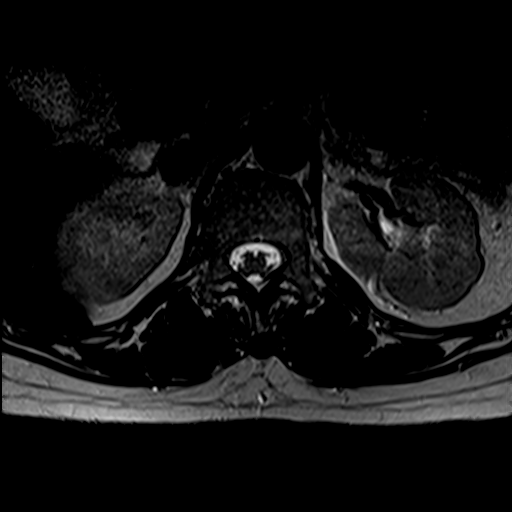
[im 6/39]
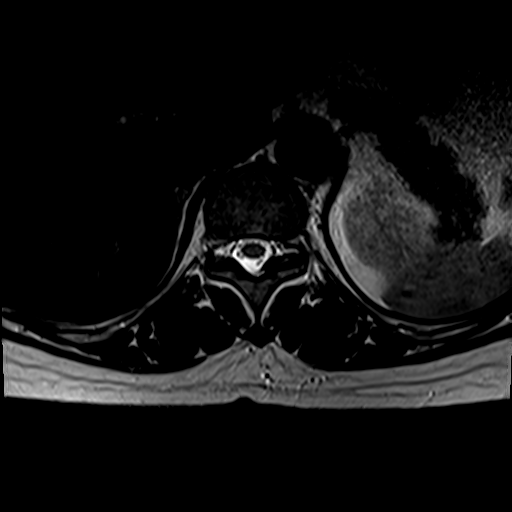
[im 12/39]
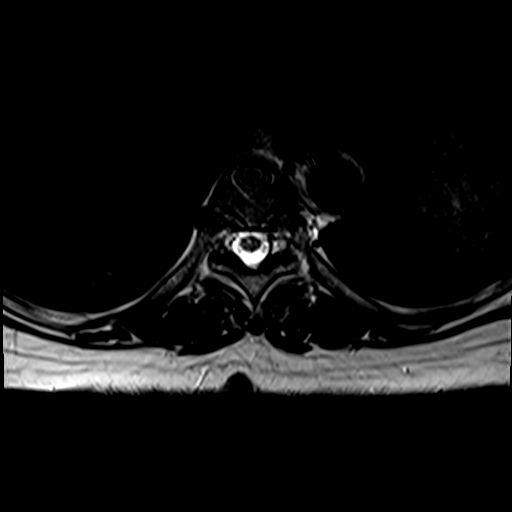
[im 18/39]
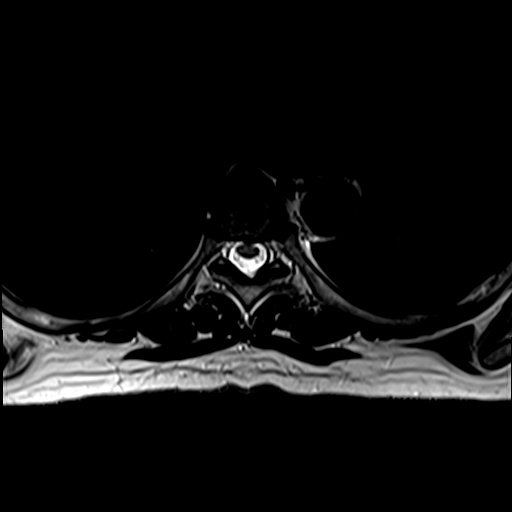
[im 21/39]
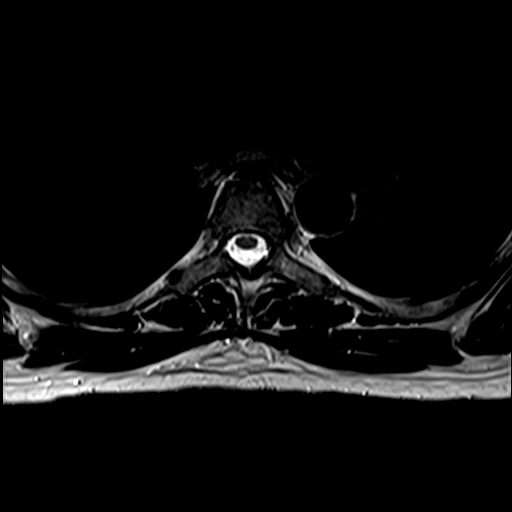
[im 27/39]
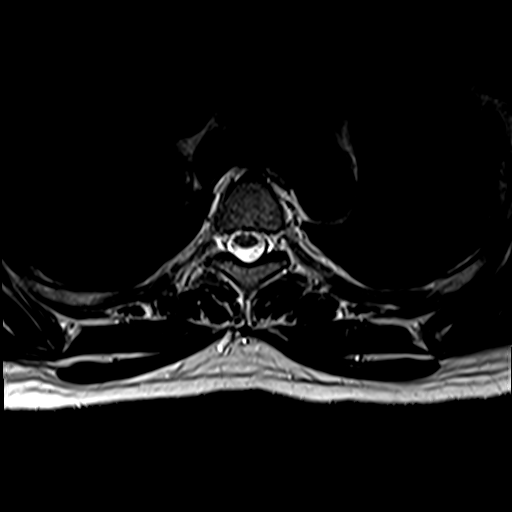
[im 33/39]
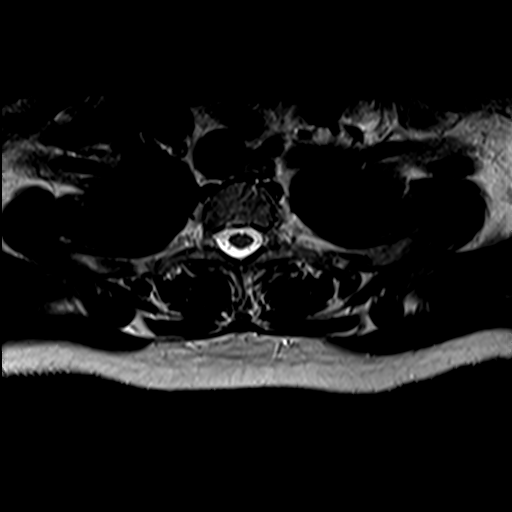

[Series 8: T2 · axial · 4.0mm · 0.39mm/px · z∈[-358,-200]mm · 3 of 39 slices shown (3 of 3)]
[im 6/39]
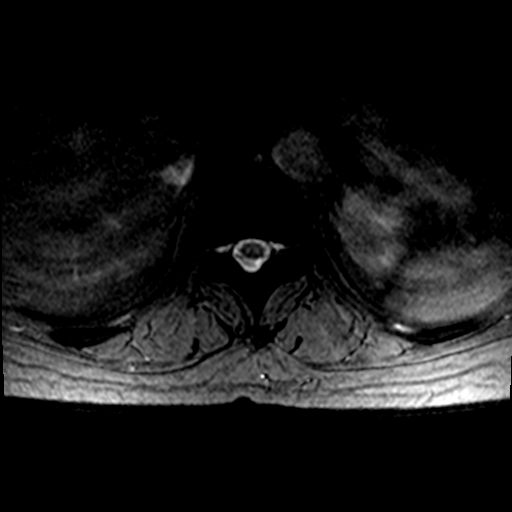
[im 21/39]
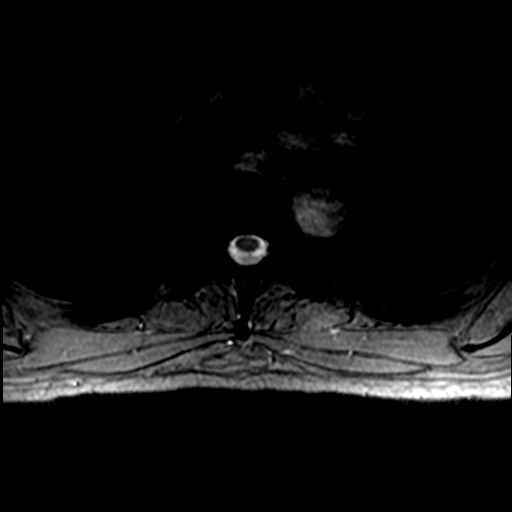
[im 33/39]
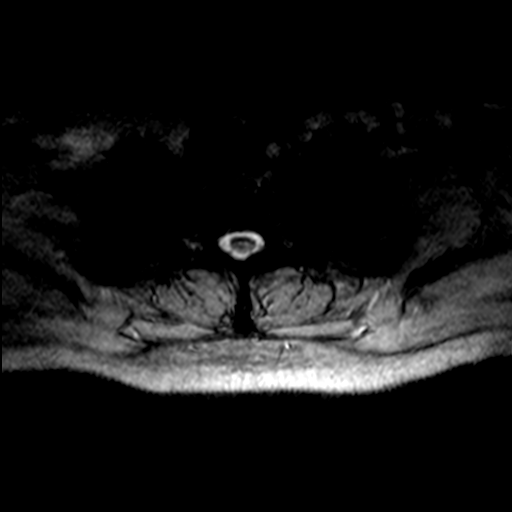

[18 of 48 positions shown; findings below may reference images not displayed]

FINDINGS: MRI CERVICAL SPINE FINDINGS

Alignment: Straightening of the cervical curvature.

Vertebrae: No fracture, evidence of discitis, or bone lesion.

Cord: Normal signal and morphology.

Posterior Fossa, vertebral arteries, paraspinal tissues: Negative.

Disc levels:

C2-3: No spinal canal or neural foraminal stenosis.

C3-4:No spinal canal or neural foraminal stenosis.

C4-5: No spinal canal or neural foraminal stenosis.

C5-6:Tiny right posterolateral disc protrusion causing minimal
indentation of the thecal sac without significant spinal canal or
neural foraminal stenosis.

C6-7: Minimal disc bulge without significant spinal canal or neural
foraminal stenosis.

C7-T1: No spinal canal or neural foraminal stenosis.

MRI THORACIC SPINE FINDINGS

Alignment:  Physiologic.

Vertebrae: No fracture, evidence of discitis, or bone lesion.

Cord:  Normal signal and morphology.

Paraspinal and other soft tissues: Negative.

Disc levels:

Tiny central disc protrusion at T7-8 causing minimal indentation on
the thecal sac without significant spinal canal or neural foraminal
stenosis. No significant disc herniation, spinal canal or neural
foraminal stenosis in the other thoracic levels.
IMPRESSION: 1. Minimal degenerative changes of the cervical and thoracic spine
without significant spinal canal or neural foraminal stenosis.
2. No spinal cord lesion identified.

## 2020-09-23 IMAGING — MR MR CERVICAL SPINE W/O CM
5 series · 36 of 48 positions shown · non-contrast
Comparison: None.

CLINICAL DATA: Right foot drop, right leg weakness.

EXAM:
MRI CERVICAL AND THORACIC SPINE WITHOUT CONTRAST
TECHNIQUE: Multiplanar and multiecho pulse sequences of the cervical spine, to
include the craniocervical junction and cervicothoracic junction,
and the thoracic spine, were obtained without intravenous contrast.

[Series 2: T2 · sagittal · 3.0mm · 0.41mm/px · 8 of 17 slices shown (1 of 2)]
[im 1/17]
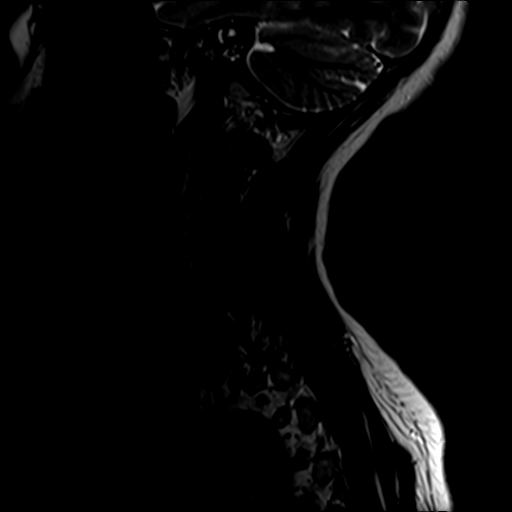
[im 3/17]
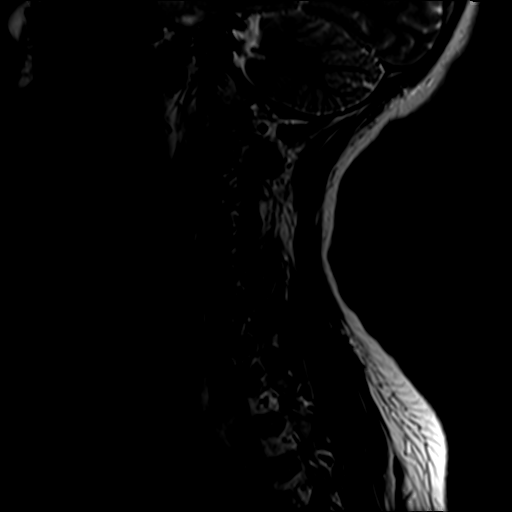
[im 5/17]
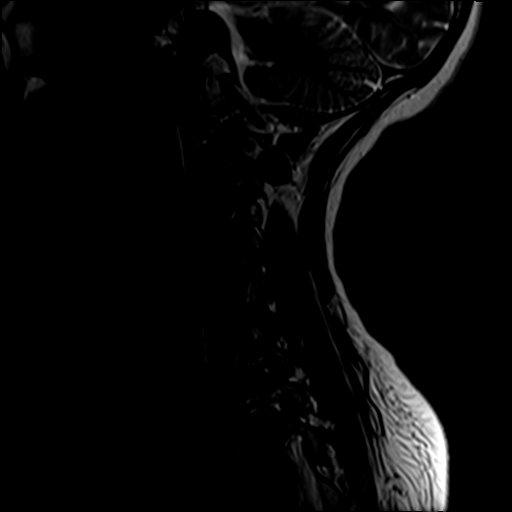
[im 7/17]
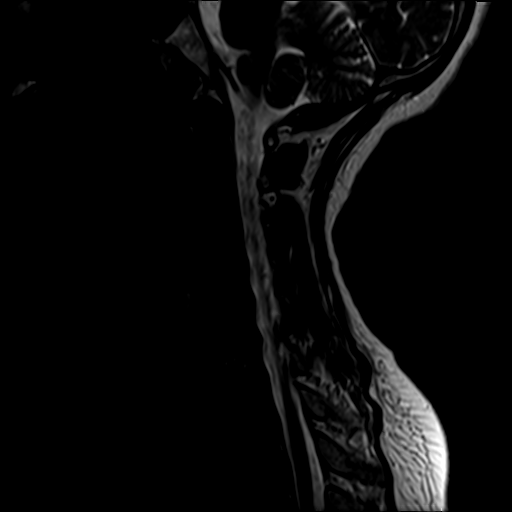
[im 10/17]
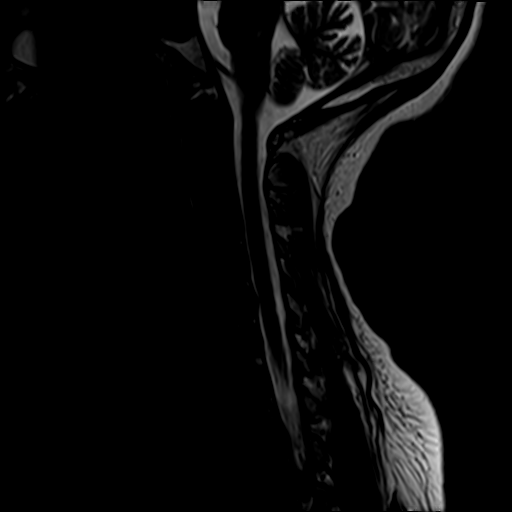
[im 12/17]
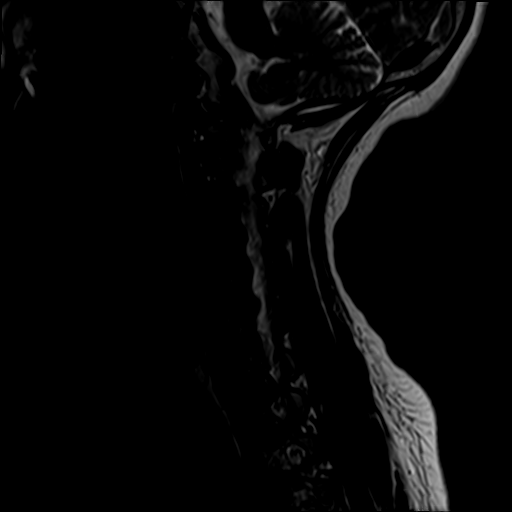
[im 14/17]
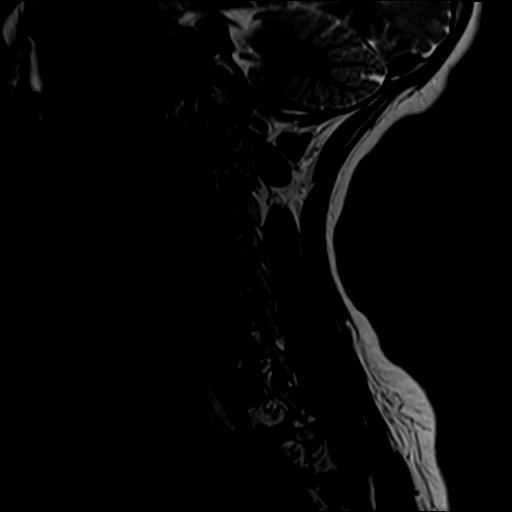
[im 17/17]
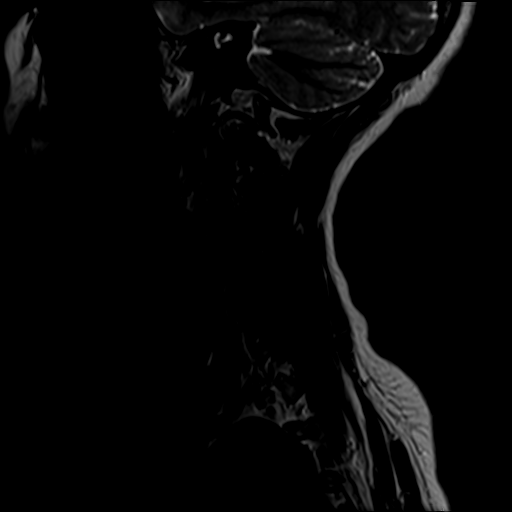

[Series 3: STIR · sagittal · 3.0mm · 0.82mm/px · 8 of 17 slices shown]
[im 1/17]
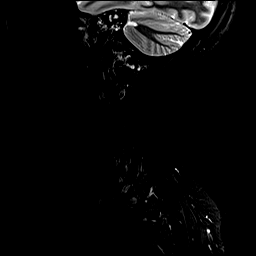
[im 3/17]
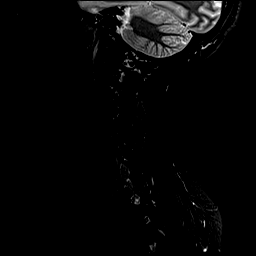
[im 5/17]
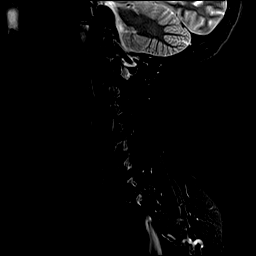
[im 7/17]
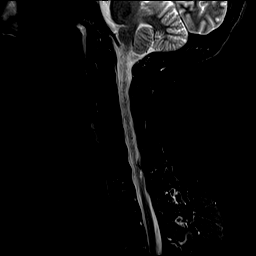
[im 10/17]
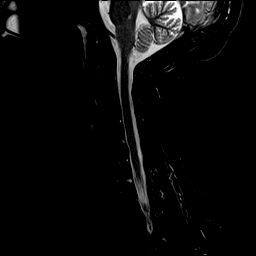
[im 12/17]
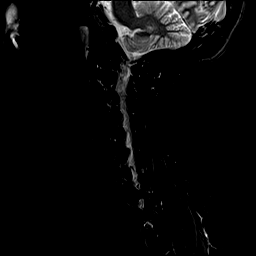
[im 14/17]
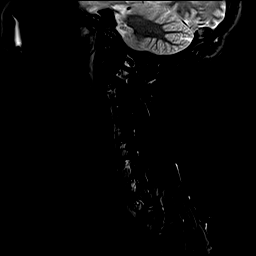
[im 17/17]
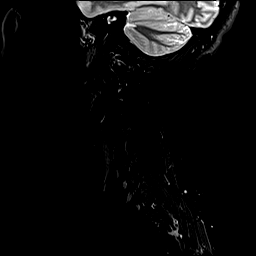

[Series 4: T1 · sagittal · 3.0mm · 0.82mm/px · 8 of 17 slices shown]
[im 1/17]
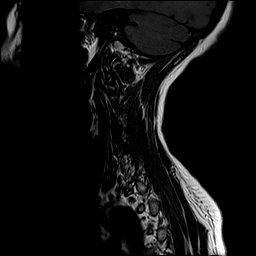
[im 3/17]
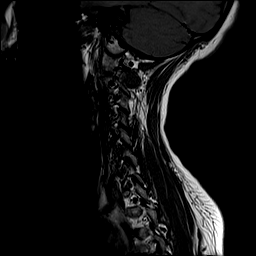
[im 5/17]
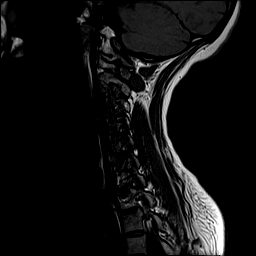
[im 7/17]
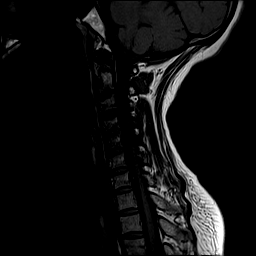
[im 10/17]
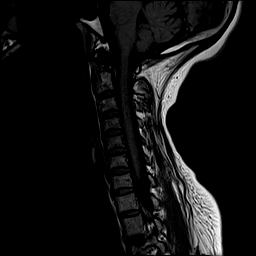
[im 12/17]
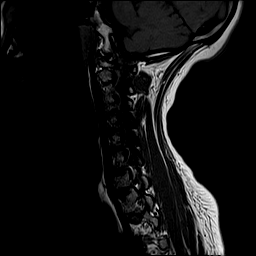
[im 14/17]
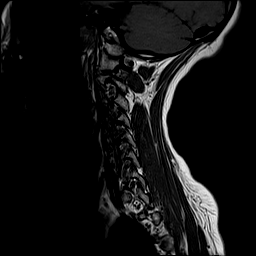
[im 17/17]
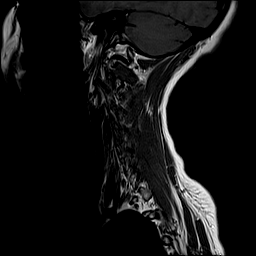

[Series 5: T2 · axial · 3.0mm · 0.70mm/px · z∈[-168,-77]mm · 9 of 26 slices shown (2 of 2)]
[im 1/26]
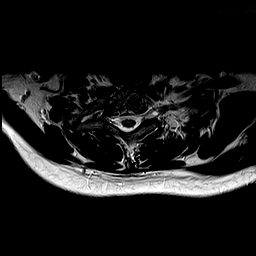
[im 5/26]
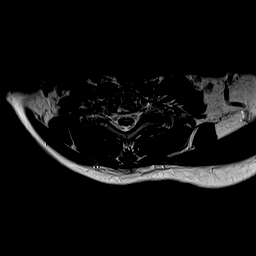
[im 7/26]
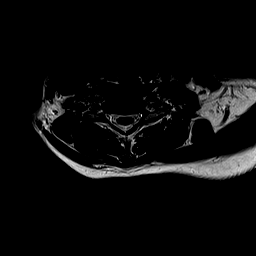
[im 12/26]
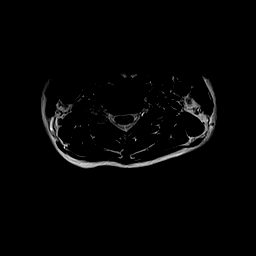
[im 14/26]
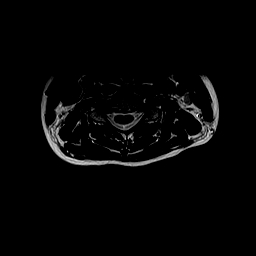
[im 19/26]
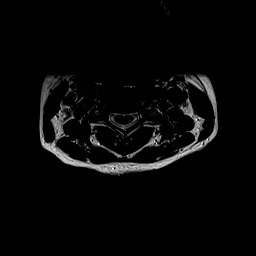
[im 21/26]
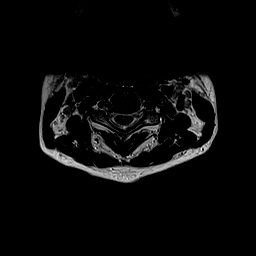
[im 23/26]
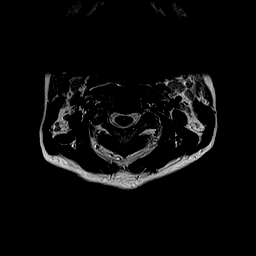
[im 26/26]
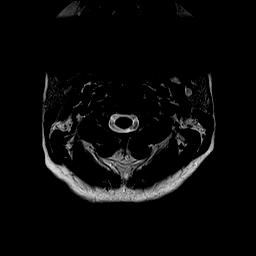

[Series 6: GRE · axial · 3.0mm · 0.35mm/px · z∈[-168,-146]mm · 3 of 26 slices shown]
[im 1/26]
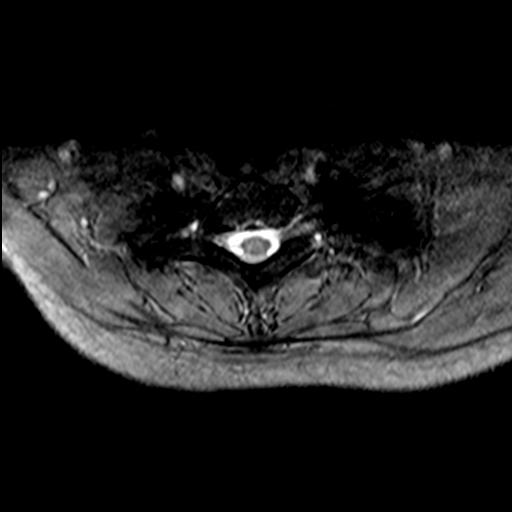
[im 5/26]
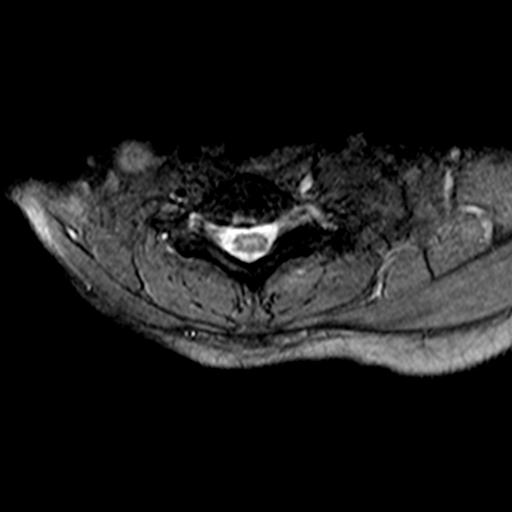
[im 7/26]
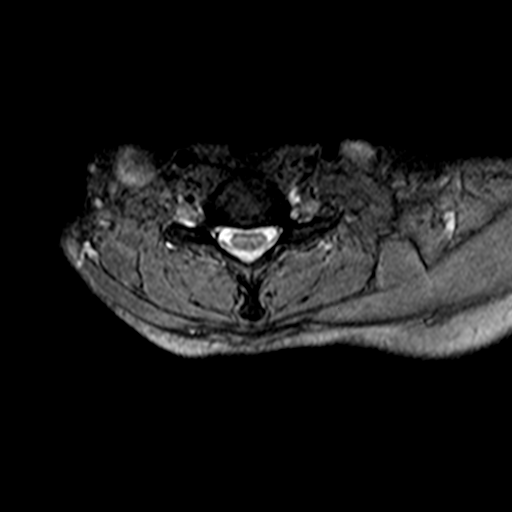

[36 of 48 positions shown; findings below may reference images not displayed]

FINDINGS: MRI CERVICAL SPINE FINDINGS

Alignment: Straightening of the cervical curvature.

Vertebrae: No fracture, evidence of discitis, or bone lesion.

Cord: Normal signal and morphology.

Posterior Fossa, vertebral arteries, paraspinal tissues: Negative.

Disc levels:

C2-3: No spinal canal or neural foraminal stenosis.

C3-4:No spinal canal or neural foraminal stenosis.

C4-5: No spinal canal or neural foraminal stenosis.

C5-6:Tiny right posterolateral disc protrusion causing minimal
indentation of the thecal sac without significant spinal canal or
neural foraminal stenosis.

C6-7: Minimal disc bulge without significant spinal canal or neural
foraminal stenosis.

C7-T1: No spinal canal or neural foraminal stenosis.

MRI THORACIC SPINE FINDINGS

Alignment:  Physiologic.

Vertebrae: No fracture, evidence of discitis, or bone lesion.

Cord:  Normal signal and morphology.

Paraspinal and other soft tissues: Negative.

Disc levels:

Tiny central disc protrusion at T7-8 causing minimal indentation on
the thecal sac without significant spinal canal or neural foraminal
stenosis. No significant disc herniation, spinal canal or neural
foraminal stenosis in the other thoracic levels.
IMPRESSION: 1. Minimal degenerative changes of the cervical and thoracic spine
without significant spinal canal or neural foraminal stenosis.
2. No spinal cord lesion identified.

## 2020-09-23 IMAGING — MR MR HEAD W/O CM
10 series · 48 of 48 positions shown · non-contrast
Comparison: None.

CLINICAL DATA: Right foot drop.

EXAM:
MRI HEAD WITHOUT CONTRAST
TECHNIQUE: Multiplanar, multiecho pulse sequences of the brain and surrounding
structures were obtained without intravenous contrast.

[Series 2: T1 · sagittal · 5.0mm · 0.45mm/px · 3 of 21 slices shown]
[im 1/21]
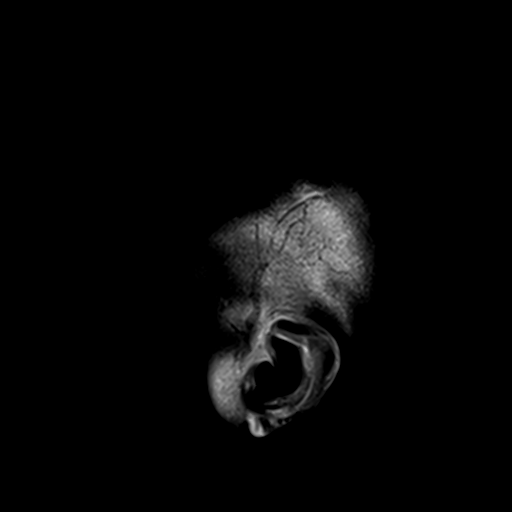
[im 11/21]
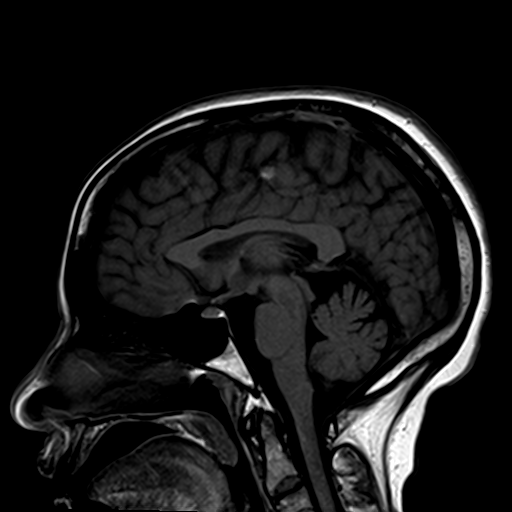
[im 21/21]
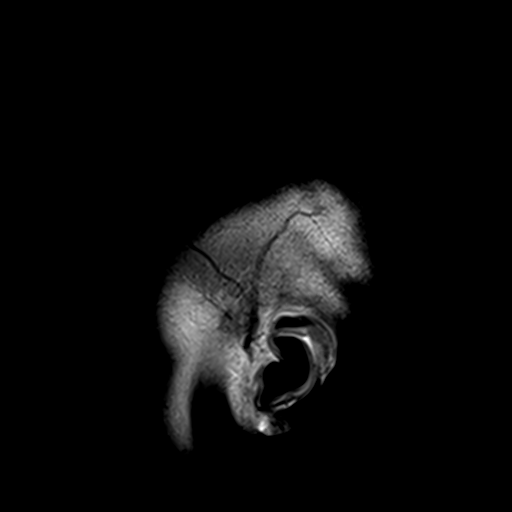

[Series 3: DWI · axial · 3.0mm · 1.80mm/px · z∈[-41,+106]mm · 9 of 100 slices shown (1 of 4)]
[im 1/100]
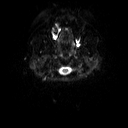
[im 13/100]
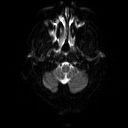
[im 25/100]
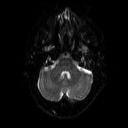
[im 38/100]
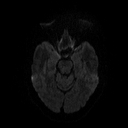
[im 50/100]
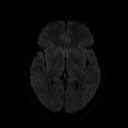
[im 62/100]
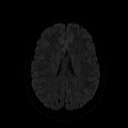
[im 75/100]
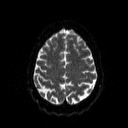
[im 87/100]
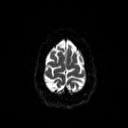
[im 100/100]
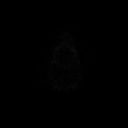

[Series 4: DWI · axial · 3.0mm · 1.80mm/px · z∈[-41,+106]mm · 4 of 49 slices shown (2 of 4)]
[im 1/49]
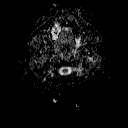
[im 17/49]
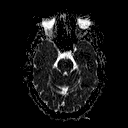
[im 33/49]
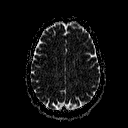
[im 49/49]
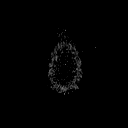

[Series 5: DWI · coronal · 5.0mm · 1.80mm/px · 6 of 70 slices shown (3 of 4)]
[im 1/70]
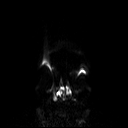
[im 14/70]
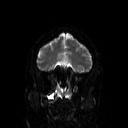
[im 28/70]
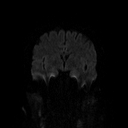
[im 42/70]
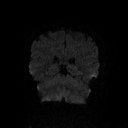
[im 56/70]
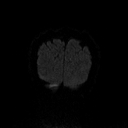
[im 70/70]
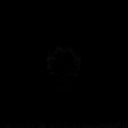

[Series 6: DWI · coronal · 5.0mm · 1.80mm/px · 3 of 37 slices shown (4 of 4)]
[im 1/37]
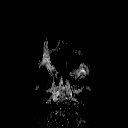
[im 19/37]
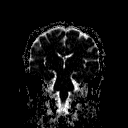
[im 37/37]
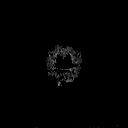

[Series 7: T2 · axial · 5.0mm · 0.60mm/px · z∈[-43,+104]mm · 2 of 22 slices shown (1 of 2)]
[im 1/22]
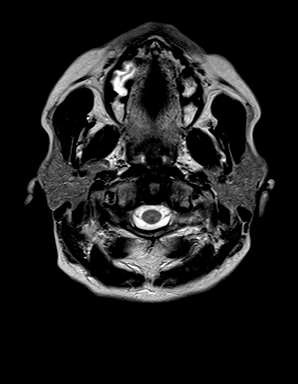
[im 22/22]
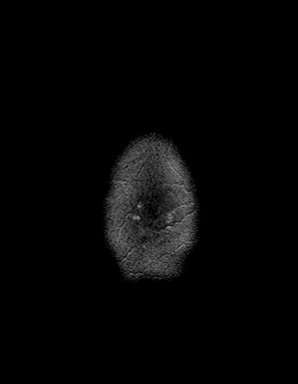

[Series 8: FLAIR · axial · 3.0mm · 0.45mm/px · z∈[-40,+104]mm · 3 of 32 slices shown]
[im 1/32]
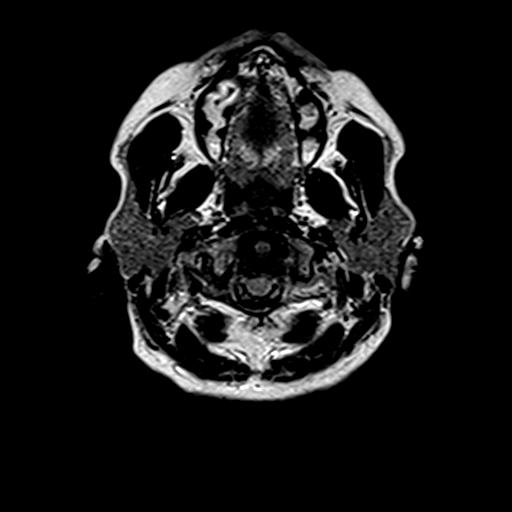
[im 16/32]
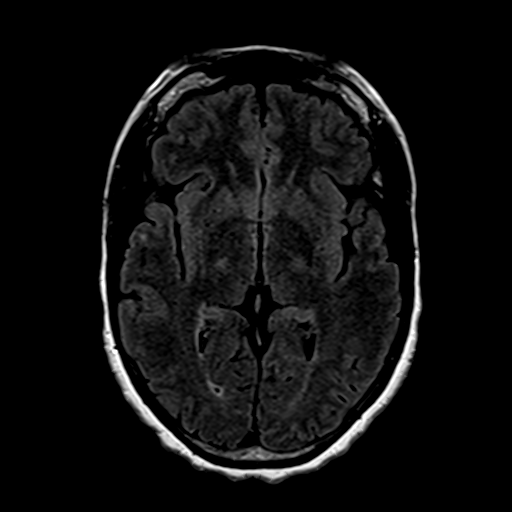
[im 32/32]
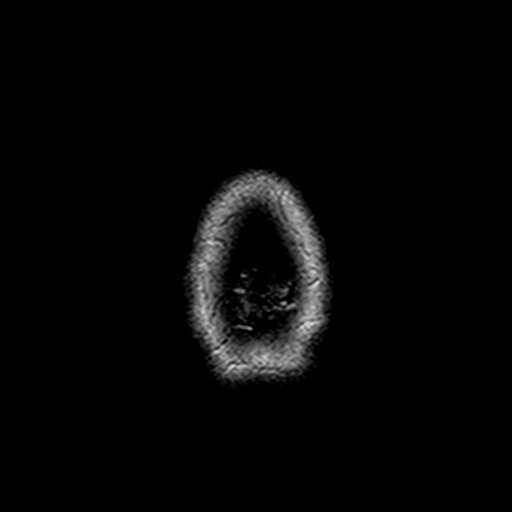

[Series 10: swi_images · axial · 4.0mm · 0.90mm/px · z∈[-38,+102]mm · 3 of 36 slices shown]
[im 1/36]
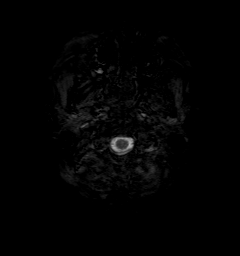
[im 18/36]
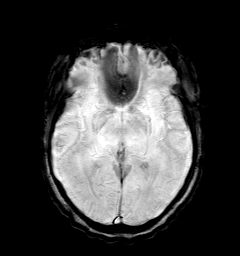
[im 36/36]
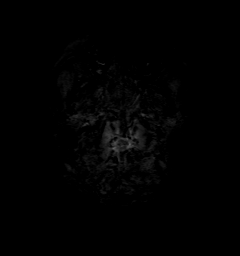

[Series 11: t1_mpr_tra · axial · 1.0mm · 0.71mm/px · z∈[-41,+102]mm · 13 of 144 slices shown]
[im 1/144]
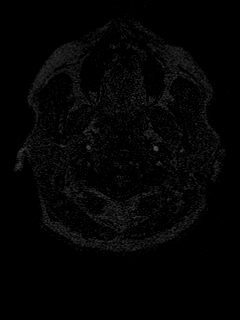
[im 12/144]
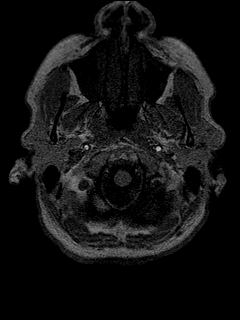
[im 24/144]
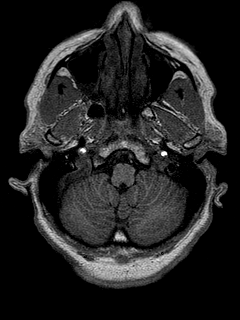
[im 36/144]
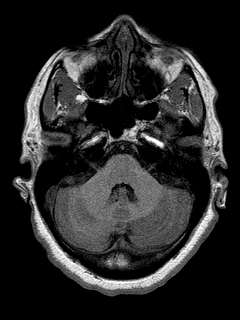
[im 48/144]
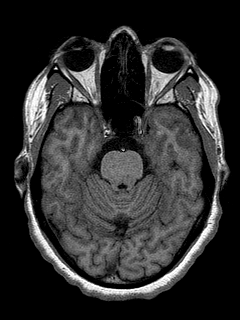
[im 60/144]
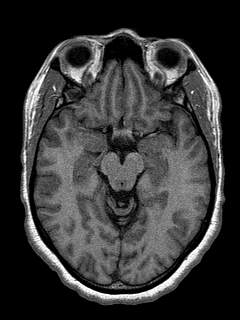
[im 72/144]
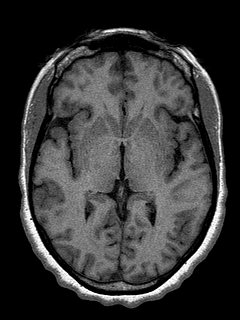
[im 84/144]
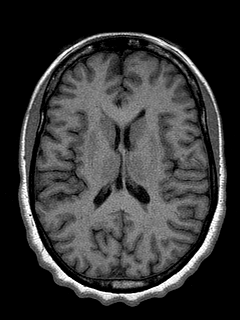
[im 96/144]
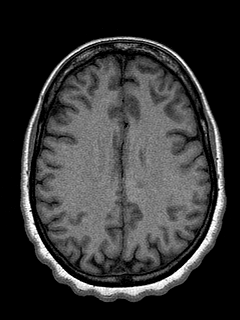
[im 108/144]
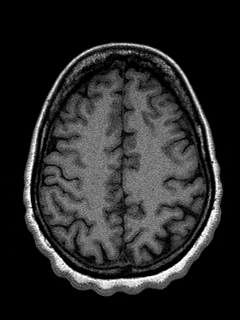
[im 120/144]
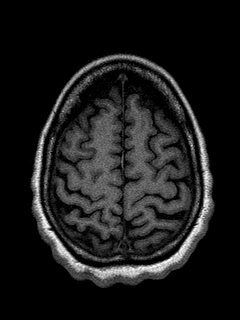
[im 132/144]
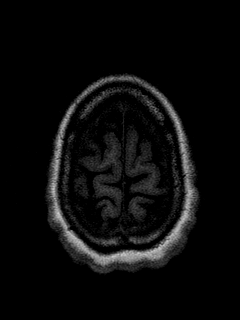
[im 144/144]
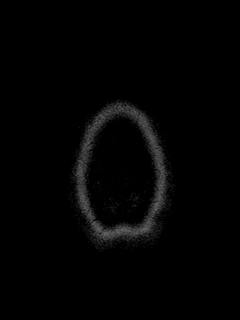

[Series 12: T2 · coronal · 5.0mm · 0.45mm/px · 2 of 28 slices shown (2 of 2)]
[im 1/28]
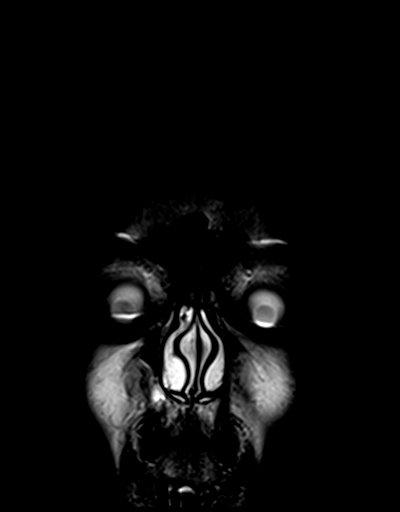
[im 28/28]
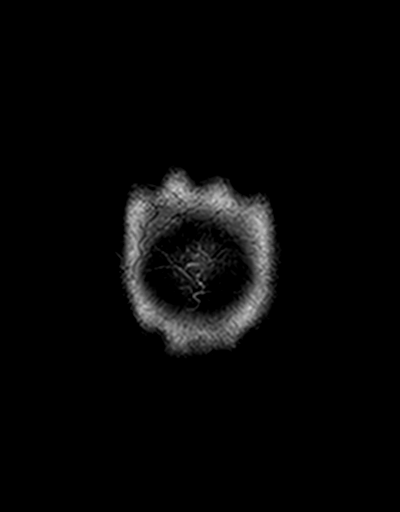

[48 of 48 positions shown; findings below may reference images not displayed]

FINDINGS: Brain: No acute infarction, hemorrhage, hydrocephalus, extra-axial
collection or mass lesion. The brain parenchyma has normal
morphology and signal characteristics.

Vascular: Normal flow voids.

Skull and upper cervical spine: Normal marrow signal.

Sinuses/Orbits: Mucosal thickening of the bilateral maxillary
sinuses with fluid level on the right. Correlate clinically for
acute sinusitis.

Other: Left mastoid effusion.
IMPRESSION: 1. No acute intracranial abnormality.
2. Mucosal thickening of the bilateral maxillary sinuses with fluid
level on the right. Correlate clinically for acute sinusitis.
3. Left mastoid effusion.

## 2020-10-09 ENCOUNTER — Ambulatory Visit (INDEPENDENT_AMBULATORY_CARE_PROVIDER_SITE_OTHER): Payer: Self-pay | Admitting: Neurology

## 2020-10-09 ENCOUNTER — Encounter: Payer: Self-pay | Admitting: Neurology

## 2020-10-09 DIAGNOSIS — R269 Unspecified abnormalities of gait and mobility: Secondary | ICD-10-CM

## 2020-10-09 DIAGNOSIS — G248 Other dystonia: Secondary | ICD-10-CM

## 2020-10-09 MED ORDER — TRIHEXYPHENIDYL HCL 2 MG PO TABS: 2.0000 mg | ORAL_TABLET | Freq: Three times a day (TID) | ORAL | 3 refills | Status: DC

## 2020-10-09 MED ORDER — CLONAZEPAM 0.5 MG PO TABS: 0.5000 mg | ORAL_TABLET | Freq: Two times a day (BID) | ORAL | 3 refills | Status: DC | PRN

## 2020-10-09 NOTE — Progress Notes (Signed)
[facility]  PATIENT: Lauren Levy DOB: [birth year]  REFERRING DOCTOR OR PCP: Yaakov Guthrie, MD (PCP); Dr. Reatha Armour (Neurosurgery) SOURCE: Patient, notes from Dr. Reatha Armour, imaging reports, MRI images personally reviewed.  _________________________________   HISTORICAL  CHIEF COMPLAINT:  Chief Complaint  Patient presents with   New Patient (Initial Visit)    New rm, husband Pilar Plate (kelly). Paper referral for  R lower extremity weakness. R foot curls in and tend to want to walk on her toes. Started about a year ago and started worsening in June. Pt states she tenses up and holds breath. Pt states her leg keeps moving until she falls asleep. Has a sensation run through her entire body and doesn't know how to relax. She is able to walk fine at night when going to the restroom, but on her way back she's unable to again.     HISTORY OF PRESENT ILLNESS:  I had the pleasure of seeing your patient, Lauren Levy, at [facility] for neurologic consultation regarding her right leg symptoms.  She is a 50 year old woman who has had a right foot drop that has progressively worsened.   As she walks, the toes curl and she has to pick the leg up high in order to walk sometimes the foot will also bend down.  When the spasms occur they are mildly painful.  She is more concerned about the difficulty walking when the spasms occur.  With the toes curl up she has difficulty putting weight on the right leg..   She notes that the symptoms started earlier this year but became much worse the past month.     She also notes some back pain and pain in the right buttock.    She denies falls.   She denies numbness in the right leg.   She has no major change in bladder function but notes more nocturia.   Interestingly, she seems to walk better right after she wakes up to use the bathroom but then is worse again after she uses the toilet.    She is fidgeting at night.  She does not feel  pain but the leg keeps moving until she falls asleep.  This has made it more difficult for her to fall asleep.  The husband notes that when she is asleep she no longer has any abnormal movements.  She is not on any neuroleptics or other medication associated with movement disorders.  Imaging and other results: MRI of the brain 09/23/2020 was normal.  Incidental note of chronic maxillary sinusitis.  MRI of the cervical and thoracic spine 09/23/2020 showed a normal spinal cord.  There is no spinal stenosis.  Mild disc degenerative changes were noted at several levels but there was no nerve root compression.  MRI of the lumbar spine 08/22/2020   By her report, MRI of the right knee and EMG/NCV of the right leg was normal.    REVIEW OF SYSTEMS: Constitutional: No fevers, chills, sweats, or change in appetite Eyes: No visual changes, double vision, eye pain Ear, nose and throat: No hearing loss, ear pain, nasal congestion, sore throat Cardiovascular: No chest pain, palpitations Respiratory:  No shortness of breath at rest or with exertion.   No wheezes GastrointestinaI: No nausea, vomiting, diarrhea, abdominal pain, fecal incontinence Genitourinary:  No dysuria, urinary retention or frequency.  No nocturia. Musculoskeletal:  No neck pain, back pain Integumentary: No rash, pruritus, skin lesions Neurological: as above Psychiatric: No depression at this time.  No anxiety Endocrine: No palpitations, diaphoresis, change in appetite, change in weigh or increased thirst Hematologic/Lymphatic:  No anemia, purpura, petechiae. Allergic/Immunologic: No itchy/runny eyes, nasal congestion, recent allergic reactions, rashes  ALLERGIES: No Known Allergies  HOME MEDICATIONS:  Current Outpatient Medications:    clonazePAM (KLONOPIN) 0.5 MG tablet, Take 1 tablet (0.5 mg total) by mouth 2 (two) times daily as needed for anxiety., Disp: 30 tablet, Rfl: 3   Magnesium 400 MG CAPS, Take 1 capsule by mouth 2  (two) times daily., Disp: , Rfl:    trihexyphenidyl (ARTANE) 2 MG tablet, Take 1 tablet (2 mg total) by mouth 3 (three) times daily with meals., Disp: 90 tablet, Rfl: 3  PAST MEDICAL HISTORY: Past Medical History:  Diagnosis Date   Foot drop    Ovarian cyst     PAST SURGICAL HISTORY: Past Surgical History:  Procedure Laterality Date   MINOR CARPAL TUNNEL Right     FAMILY HISTORY: Family History  Problem Relation Age of Onset   High blood pressure Mother    Osteoporosis Mother    Arthritis Mother    Arthritis Father    Osteoporosis Father    Breast cancer Paternal Aunt     SOCIAL HISTORY:  Social History   Socioeconomic History   Marital status: Married    Spouse name: Claiborne Billings   Number of children: 3   Years of education: Not on file   Highest education level: Bachelor's degree (e.g., BA, AB, BS)  Occupational History   Not on file  Tobacco Use   Smoking status: Former    Types: Cigarettes    Quit date: 2005    Years since quitting: 17.6   Smokeless tobacco: Never  Substance and Sexual Activity   Alcohol use: Yes    Comment: occ   Drug use: No   Sexual activity: Not on file  Other Topics Concern   Not on file  Social History Narrative   Live with husband and children   Left handed   Caffeine: 2 cups of tea a day, 1 soda a day   Social Determinants of Health   Financial Resource Strain: Not on file  Food Insecurity: Not on file  Transportation Needs: Not on file  Physical Activity: Not on file  Stress: Not on file  Social Connections: Not on file  Intimate Partner Violence: Not on file     PHYSICAL EXAM  There were no vitals filed for this visit.  There is no height or weight on file to calculate BMI.   General: The patient is well-developed and well-nourished and in no acute distress  HEENT:  Head is [place]/AT.  Sclera are anicteric.    Neck: No carotid bruits are noted.  The neck is nontender.  Good range of motion.  Cardiovascular: The  heart has a regular rate and rhythm with a normal S1 and S2. There were no murmurs, gallops or rubs.    Skin: Extremities are without rash or  edema.  Musculoskeletal:  Back is slightly tender over the right piriformis muscle.  No tenderness over the lumbar paraspinals.  Neurologic Exam  Mental status: The patient is alert and oriented x 3 at the time of the examination. The patient has apparent normal recent and remote memory, with an apparently normal attention span and concentration ability.   Speech is normal.  Cranial nerves: Extraocular movements are full.  Facial symmetry is present. There is good facial sensation to soft touch bilaterally.Facial strength is normal.  Trapezius and  sternocleidomastoid strength is normal. No dysarthria is noted.  No obvious hearing deficits are noted.  Motor:  Muscle bulk is normal.   Tone is normal.  While sitting down, strength is  5 / 5 in all 4 extremities.   Other:  Sensory: Sensory testing is intact to pinprick, soft touch and vibration sensation in all 4 extremities.  Coordination: Cerebellar testing reveals good finger-nose-finger and heel-to-shin bilaterally.  Gait and station: Upon standing, the toes flex and as she tries to walk she has plantar flexion of the right foot.  This causes her to need to walk on the right toes which she is unable to do.     Romberg is negative.   Reflexes: Deep tendon reflexes are symmetric and normal bilaterally.   Plantar responses are flexor.    DIAGNOSTIC DATA (LABS, IMAGING, TESTING) - I reviewed patient records, labs, notes, testing and imaging myself where available.  Lab Results  Component Value Date   WBC 5.7 03/26/2016   HGB 11.3 (L) 03/26/2016   HCT 34.2 (L) 03/26/2016   MCV 91.7 03/26/2016   PLT 326 03/26/2016      Component Value Date/Time   NA 139 03/26/2016 1915   K 4.2 03/26/2016 1915   CL 105 03/26/2016 1915   CO2 28 03/26/2016 1915   GLUCOSE 98 03/26/2016 1915   BUN 15 03/26/2016  1915   CREATININE 0.67 03/26/2016 1915   CALCIUM 9.2 03/26/2016 1915   PROT 7.4 03/26/2016 1915   ALBUMIN 4.1 03/26/2016 1915   AST 20 03/26/2016 1915   ALT 17 03/26/2016 1915   ALKPHOS 49 03/26/2016 1915   BILITOT 0.4 03/26/2016 1915   GFRNONAA >60 03/26/2016 1915   GFRAA >60 03/26/2016 1915      ASSESSMENT AND PLAN  Focal dystonia  Gait disturbance   In summary, Ms. Danbury is a 50 year old woman with right leg dystonia that occur upon standing and trying to walk.  While sitting, her leg exam is fairly normal.  I reviewed multiple MRI images and they are noncontributory.  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  I will place her on trihexyphenidyl 2 mg 3 times daily and clonazepam 0.5 mg twice daily.  If after few weeks she gets no benefit we will consider Botox therapy  Follow-up will be arranged based on her response to the medications.  If Botox therapy is needed, I will likely have her see Dr. Krista Blue in my group for the injections.  Thank you for asking to see Ms. Vallee.  Please let me know if I can be of further assistance with her or other patients in the future.   Wirt Hemmerich A. Felecia Shelling, MD, [facility] [id number], [id number] PM Certified in Neurology, Clinical Neurophysiology, Sleep Medicine and Neuroimaging  [facility] [address] [place], [place] [address] [phone]

## 2020-12-04 ENCOUNTER — Telehealth: Payer: Self-pay | Admitting: Neurology

## 2020-12-04 NOTE — Telephone Encounter (Signed)
Called pt back. States she has gotten some benefit from Artane and clonazepam. Taking as prescribed. Still limping, using knee scooter to get around. Toes still curling in. However, right foot no longer wants to turn in, this is an improvement. Wanting to know if MD wants to change medication dose or go botox route. Aware he is out until next week and ok to wait until he is back to address. Aware we will f/u next week with her.

## 2020-12-04 NOTE — Telephone Encounter (Signed)
Pt called, clonazePAM (KLONOPIN) 0.5 MG tablet and trihexyphenidyl (ARTANE) 2 MG tablet Getting some benefit from the medications. Would like a call from the nurse to discuss if need to do Botox or increase dosage.

## 2020-12-08 MED ORDER — TRIHEXYPHENIDYL HCL 2 MG PO TABS: 2.0000 mg | ORAL_TABLET | Freq: Three times a day (TID) | ORAL | 3 refills | Status: DC

## 2020-12-08 NOTE — Telephone Encounter (Signed)
Called and LVM for pt relaying Dr. Garth Bigness recommendation. Advised we will send in new script to pharmacy for her. Asked her to call back if she has any further questions.

## 2020-12-10 ENCOUNTER — Other Ambulatory Visit: Payer: Self-pay | Admitting: Neurology

## 2020-12-10 NOTE — Telephone Encounter (Signed)
Received refill request for clonazepam.  Last OV was on 10/09/20.  Next OV is scheduled for 03/10/20 .  Last RX was written on 11/25/20 for 30 tabs.   [facility] has been reviewed.

## 2020-12-22 ENCOUNTER — Other Ambulatory Visit: Payer: Self-pay | Admitting: Neurology

## 2020-12-22 ENCOUNTER — Telehealth: Payer: Self-pay | Admitting: Neurology

## 2020-12-22 NOTE — Telephone Encounter (Signed)
Called pt back. She was taking Artane 2mg  2 tabs po TID since 12/08/20 but decreased back 2mg  po TID d/t itching. Itching on back/buttocks still present. Previously did not have itching on lower dose. Has used bath bombs recently x2. Unsure if this is cause? She is going to stop use until she figures out cause. Has not changed detergent/soap recently.   Also previously reported right foot doing better but feels foot has declined since changing Artane dose around. Wanting to turn in again. Wondering if she should proceed with botox?   Aware I will send message to Dr. Felecia Shelling and call her back today or tomorrow with response.

## 2020-12-22 NOTE — Telephone Encounter (Signed)
Pt called, having side effect to increasing the dosage of trihexyphenidyl (ARTANE) 2 MG tablet. Having itchiness on my back. Using Benadryl lotion to calm the itching and cortisone. Would like a call from the nurse.

## 2020-12-23 MED ORDER — BENZTROPINE MESYLATE 1 MG PO TABS: ORAL_TABLET | ORAL | 5 refills | Status: DC

## 2020-12-23 NOTE — Telephone Encounter (Signed)
Called and spoke with pt. Relayed Dr. Garth Bigness message. She is agreeable to plan. She will take artane as prescribed this afternoon and then stop. She will start cogentin tomorrow. E-scribed rx to [facility]. Asked Artane to be d/c'd.   Itching about the same. Placed topical benadryl this am around 5 and it was effective. She will let us know if this does not improve.

## 2020-12-23 NOTE — Telephone Encounter (Signed)
Per Dr. Felecia Shelling, "she should stop the artane, cogentin is very similar"

## 2020-12-31 ENCOUNTER — Other Ambulatory Visit: Payer: Self-pay | Admitting: Neurology

## 2020-12-31 NOTE — Telephone Encounter (Signed)
Received refill request for clonazepam.  Last OV was on 10/09/20.  Next OV is scheduled for 03/10/21 .  Last RX was written on 12/10/20 for 30 tabs= 2 weeks  [place] Drug Database has been reviewed.

## 2021-01-07 ENCOUNTER — Telehealth: Payer: Self-pay | Admitting: Neurology

## 2021-01-07 MED ORDER — TRIHEXYPHENIDYL HCL 2 MG PO TABS: ORAL_TABLET | ORAL | 5 refills | Status: DC

## 2021-01-07 NOTE — Addendum Note (Signed)
Addended by: Darleen Crocker on: 01/07/2021 10:39 AM   Modules accepted: Orders

## 2021-01-07 NOTE — Telephone Encounter (Signed)
Pt states that the benztropine (COGENTIN) 1 MG tablet is not working for her and is keeping her sleepy all day. Pt states that the old medication that she was on before the changed worked much better than the new one.Please advise.

## 2021-01-07 NOTE — Telephone Encounter (Signed)
Called the patient back.  Patient states that previously on the Artane medication when the dose was increased she developed itching which is why they had stopped that medication and started the Cogentin.  Patient started the Cogentin on November 15,22 and states that since taking that medication she has not noticed any improvement with her foot.  She feels that is actually getting back to where it was when she first discussed this with Dr. Felecia Shelling.  She describes feeling sleepy all the time. Patient is interested in restarting the Artane.  She would like to start it at the low-dose where she was previously and then potentially titrate up again to where Dr. Felecia Shelling had increased it.  She had used a bath around the time that the itching had developed and was unsure if the itching was related to that or the medication being increased.  When he was on Artane she did feel improvement and it worked more effectively than the Cogentin is currently working.  Patient still has that medication from where it was previously prescribed and she is asking if she can stop the Cogentin and restart that medication.  Advised that I will discuss this with Dr. Felecia Shelling and reach back out with his thoughts and recommendations. Pt verbalized understanding.

## 2021-01-07 NOTE — Telephone Encounter (Signed)
Dr Felecia Shelling agreed to the plan wit switching the patient back to artane. She would like to start back initially with 2 mg TID and if tolerates try increasing again to the 2-4 mg TID to see if that helps. She has some of the medication already at home will advise her to continue that medication bottle first before getting the new one.

## 2021-01-15 ENCOUNTER — Other Ambulatory Visit: Payer: Self-pay | Admitting: Neurology

## 2021-02-10 ENCOUNTER — Ambulatory Visit
Admission: RE | Admit: 2021-02-10 | Discharge: 2021-02-10 | Disposition: A | Discharge status: Home / Self Care | Payer: Managed Care, Other (non HMO) | Source: Ambulatory Visit | Attending: Family Medicine | Admitting: Family Medicine

## 2021-02-10 ENCOUNTER — Other Ambulatory Visit: Payer: Self-pay | Admitting: Family Medicine

## 2021-02-10 DIAGNOSIS — N6310 Unspecified lump in the right breast, unspecified quadrant: Secondary | ICD-10-CM

## 2021-02-10 DIAGNOSIS — R928 Other abnormal and inconclusive findings on diagnostic imaging of breast: Secondary | ICD-10-CM

## 2021-02-10 IMAGING — US US BREAST*R* LIMITED INC AXILLA
1 series · 8 of 8 positions shown · non-contrast
Comparison: Previous exam(s).
COMPARISON: Previous exam(s).

Addendum:
CLINICAL DATA: 51-year-old female presenting for six-month
follow-up of a left breast mass.

EXAM:
ULTRASOUND OF THE LEFT BREAST

[Series 1: us breast*right* limited inc axilla · 0.05mm/px · 8 of 8 slices shown]
[im 1/8]
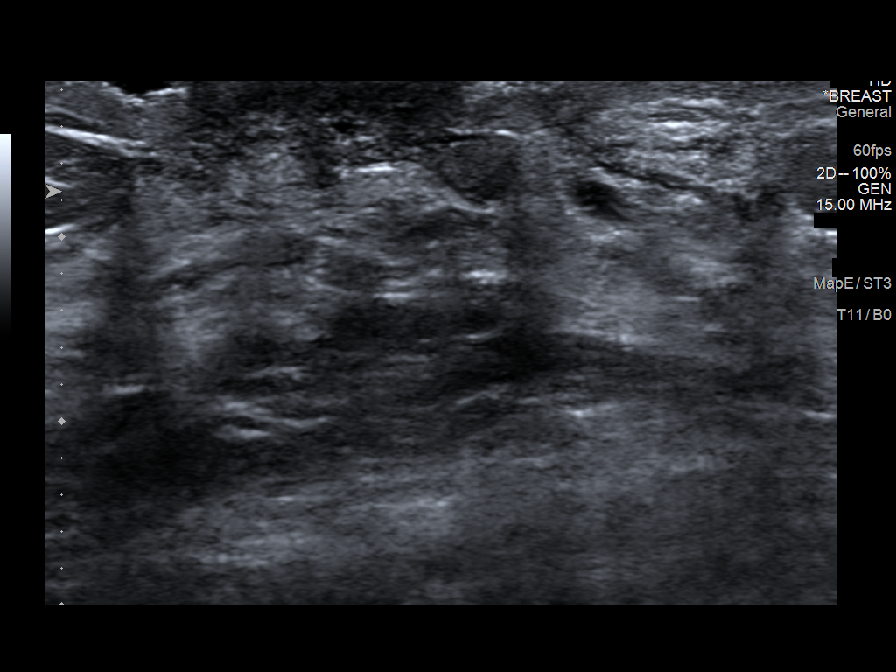
[im 2/8]
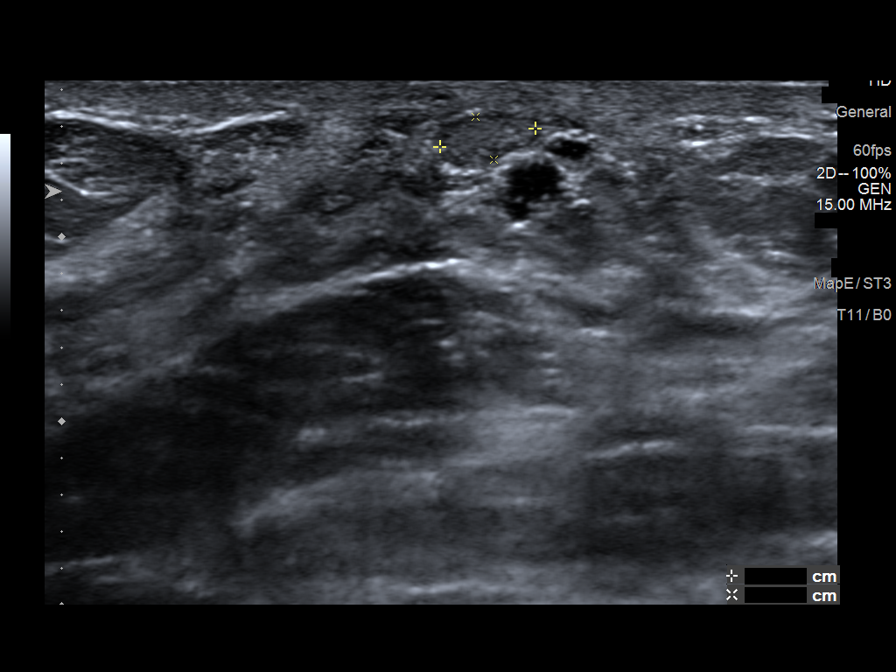
[im 3/8]
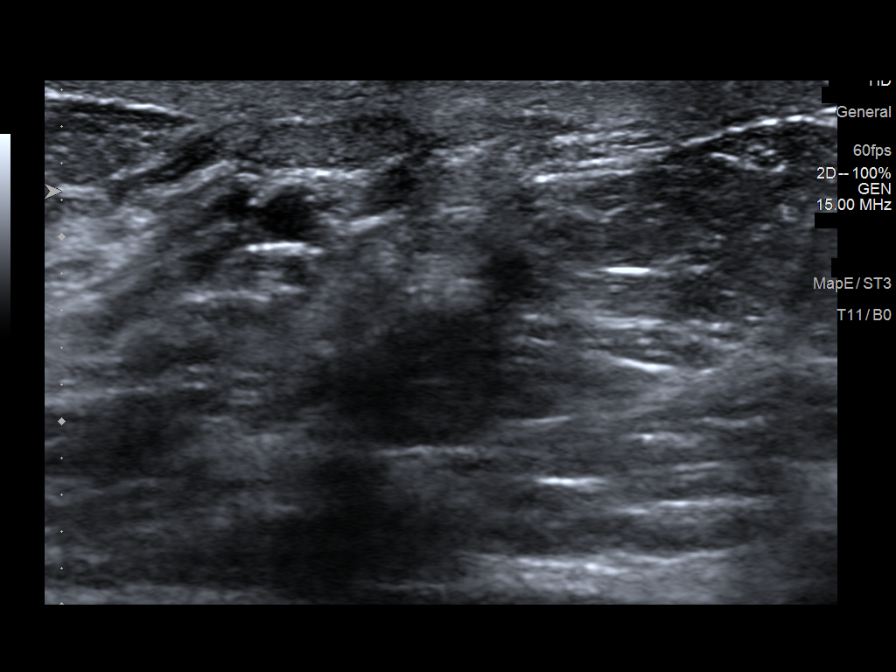
[im 4/8]
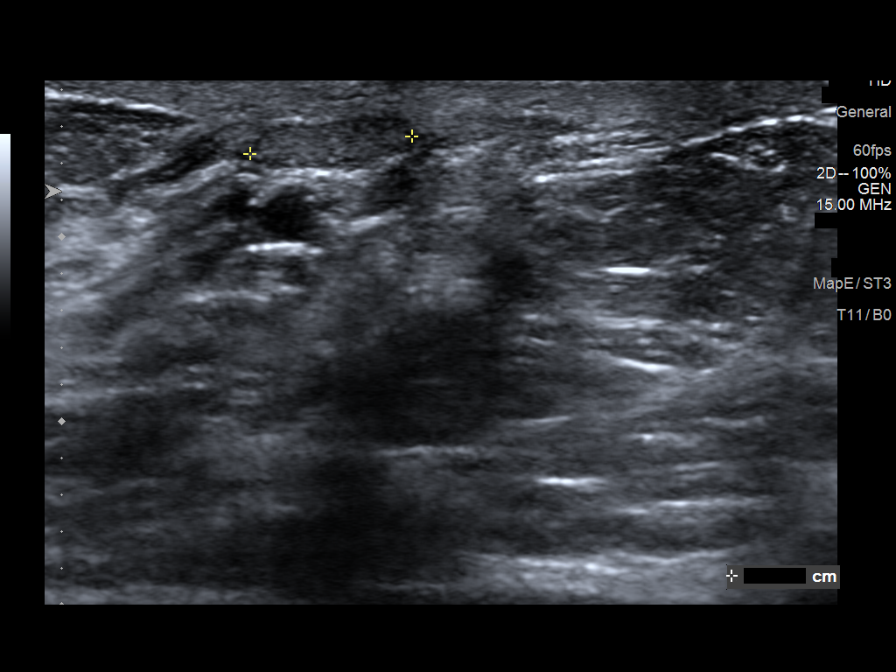
[im 5/8]
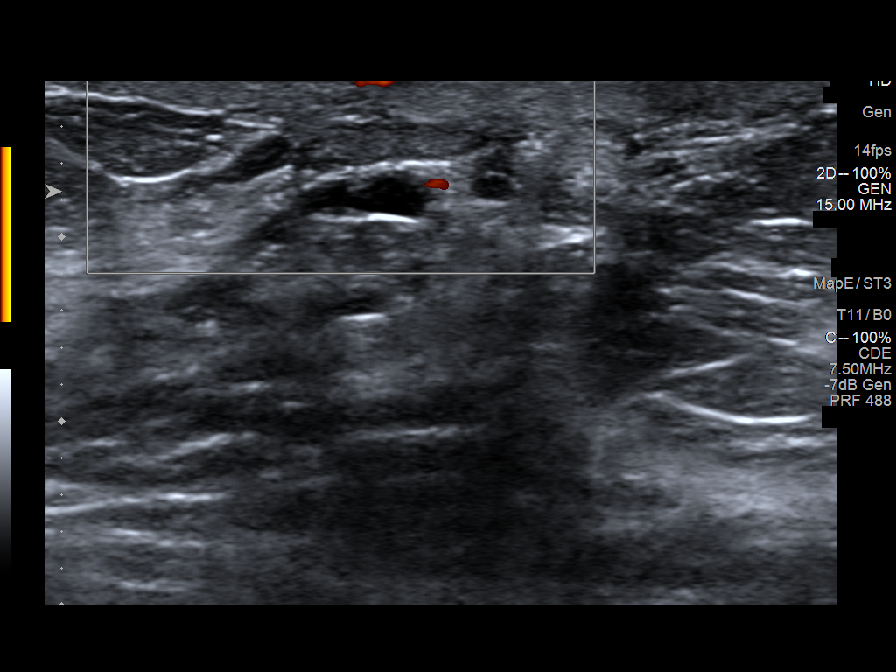
[im 6/8]
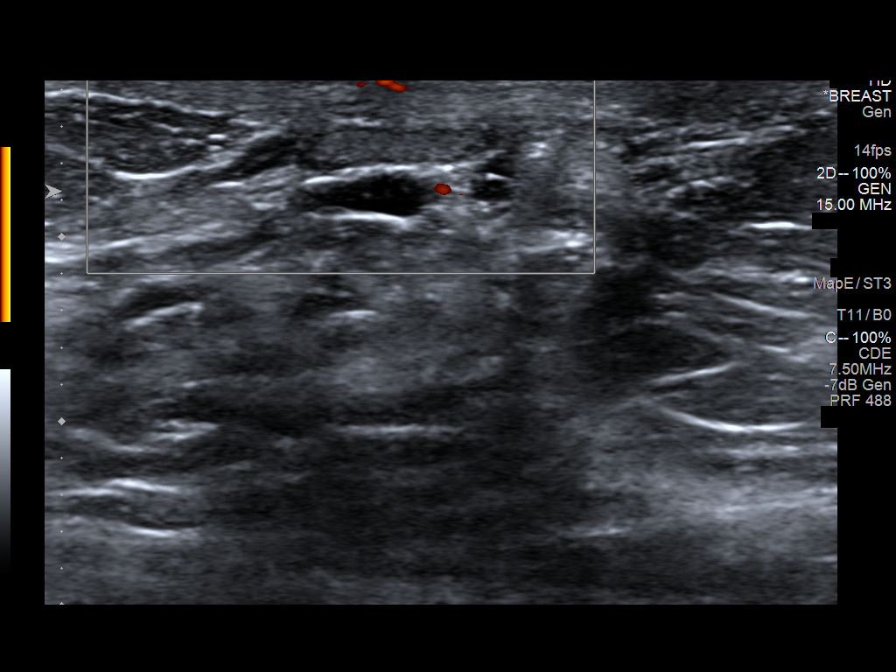
[im 7/8]
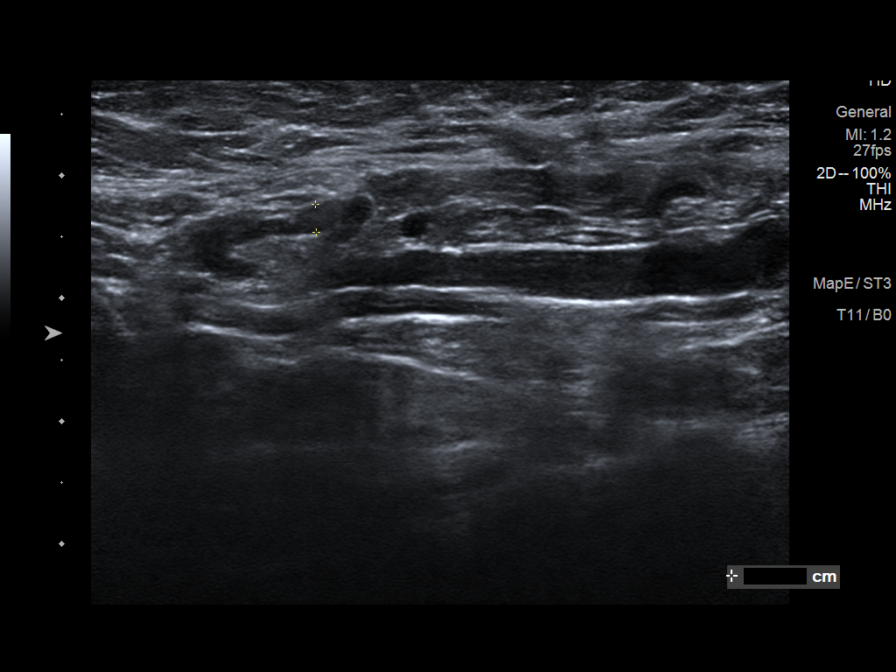
[im 8/8]
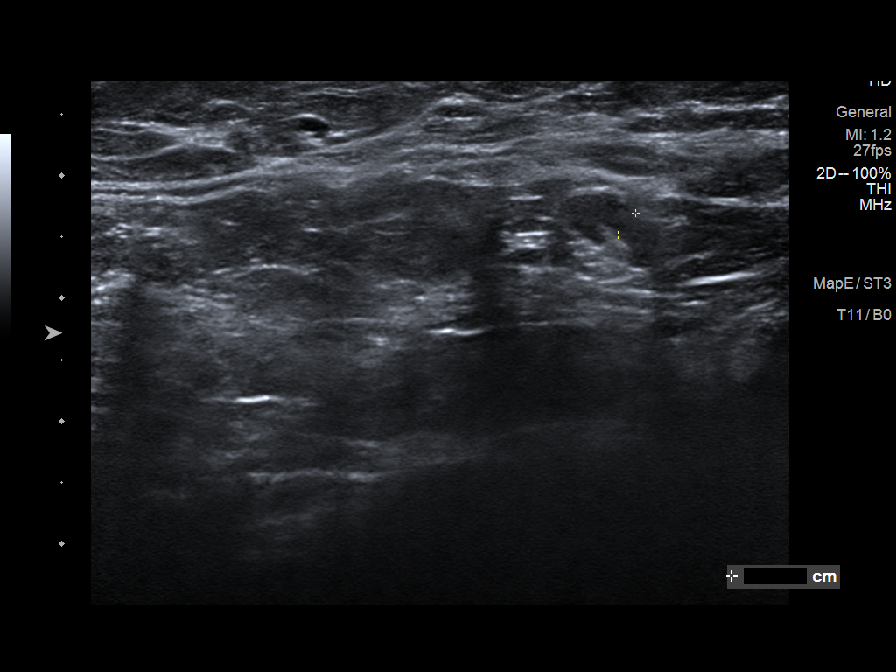

[8 of 8 positions shown; findings below may reference images not displayed]

FINDINGS: Targeted ultrasound is performed, showing a hypoechoic mass located
within a duct the subareolar right breast. Today it measures 9 x 5 x
3 mm (previously 4 x 3 x 1 mm). Evaluation of the right axilla
demonstrates no suspicious lymphadenopathy.
IMPRESSION: 1. Indeterminate subareolar right breast mass. Recommendation is for
ultrasound-guided biopsy.
2. No suspicious right axillary lymphadenopathy.

RECOMMENDATION:
Ultrasound-guided biopsy of the right breast.

I have discussed the findings and recommendations with the patient.
If applicable, a reminder letter will be sent to the patient
regarding the next appointment.

BI-RADS CATEGORY  4: Suspicious.

ADDENDUM:
This is a addendum to the report dictated earlier the same day.

Please note the clinical data and exam sections of the report are
incorrect. The patient is presenting for six-month follow-up of the
RIGHT breast, and evaluation of the RIGHT breast was performed
today.

*** End of Addendum ***
FINDINGS: Targeted ultrasound is performed, showing a hypoechoic mass located
within a duct the subareolar right breast. Today it measures 9 x 5 x
3 mm (previously 4 x 3 x 1 mm). Evaluation of the right axilla
demonstrates no suspicious lymphadenopathy.
IMPRESSION: 1. Indeterminate subareolar right breast mass. Recommendation is for
ultrasound-guided biopsy.
2. No suspicious right axillary lymphadenopathy.

RECOMMENDATION:
Ultrasound-guided biopsy of the right breast.

I have discussed the findings and recommendations with the patient.
If applicable, a reminder letter will be sent to the patient
regarding the next appointment.

BI-RADS CATEGORY  4: Suspicious.

## 2021-02-16 ENCOUNTER — Ambulatory Visit
Admission: RE | Admit: 2021-02-16 | Discharge: 2021-02-16 | Disposition: A | Discharge status: Home / Self Care | Payer: Managed Care, Other (non HMO) | Source: Ambulatory Visit | Attending: Family Medicine | Admitting: Family Medicine

## 2021-02-16 DIAGNOSIS — N6310 Unspecified lump in the right breast, unspecified quadrant: Secondary | ICD-10-CM

## 2021-02-16 HISTORY — PX: BREAST BIOPSY: SHX20

## 2021-02-16 IMAGING — MG MM BREAST LOCALIZATION CLIP
4 series · 4 of 12 positions shown · non-contrast
Comparison: Previous exam(s).

CLINICAL DATA: Status post biopsy indeterminate right breast mass.

EXAM:
3D DIAGNOSTIC RIGHT MAMMOGRAM POST ULTRASOUND BIOPSY

[R ML synth-2D]
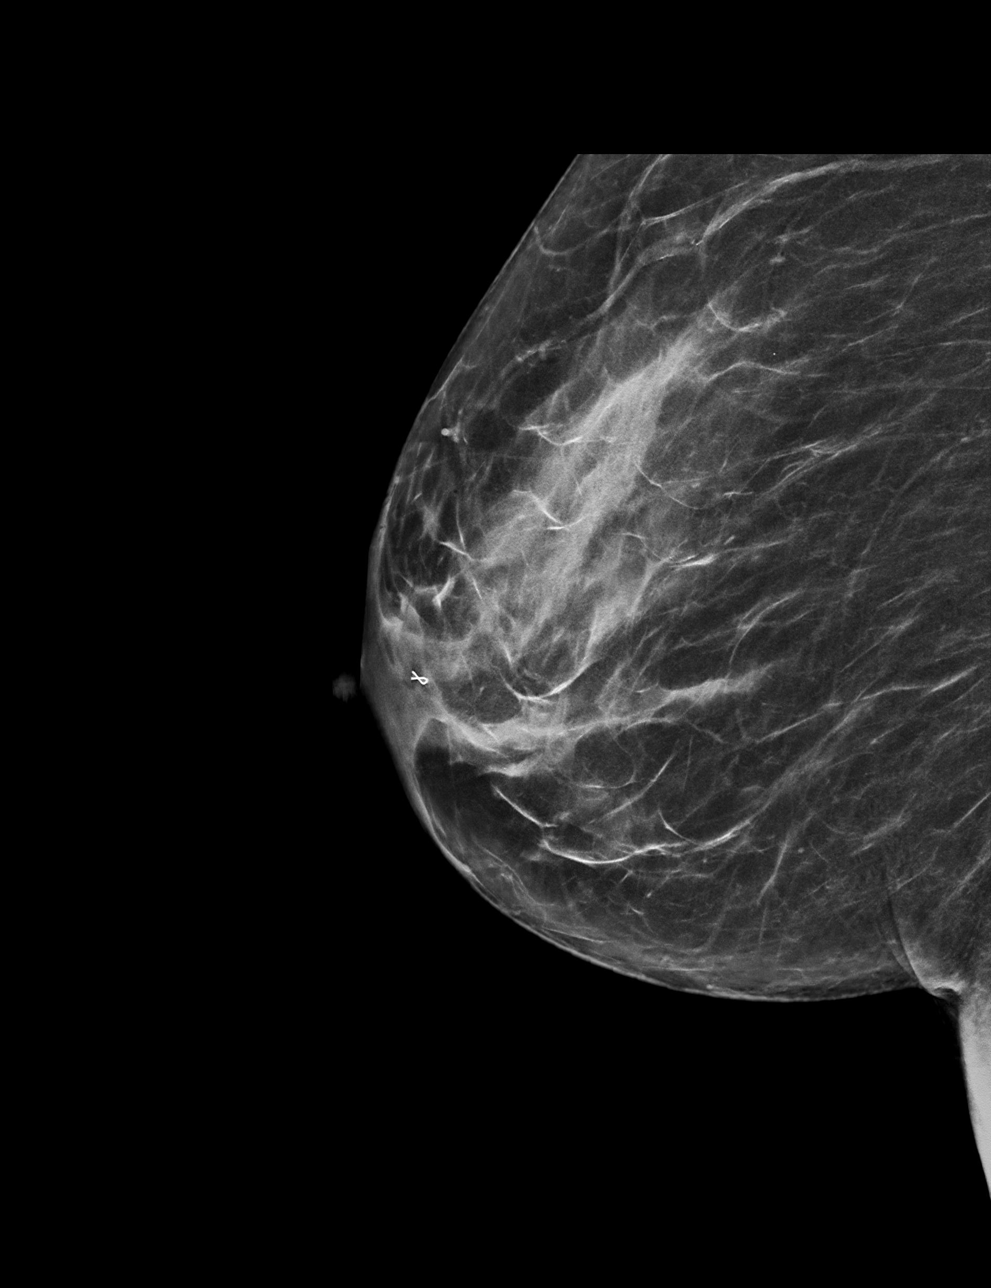

[R CC synth-2D]
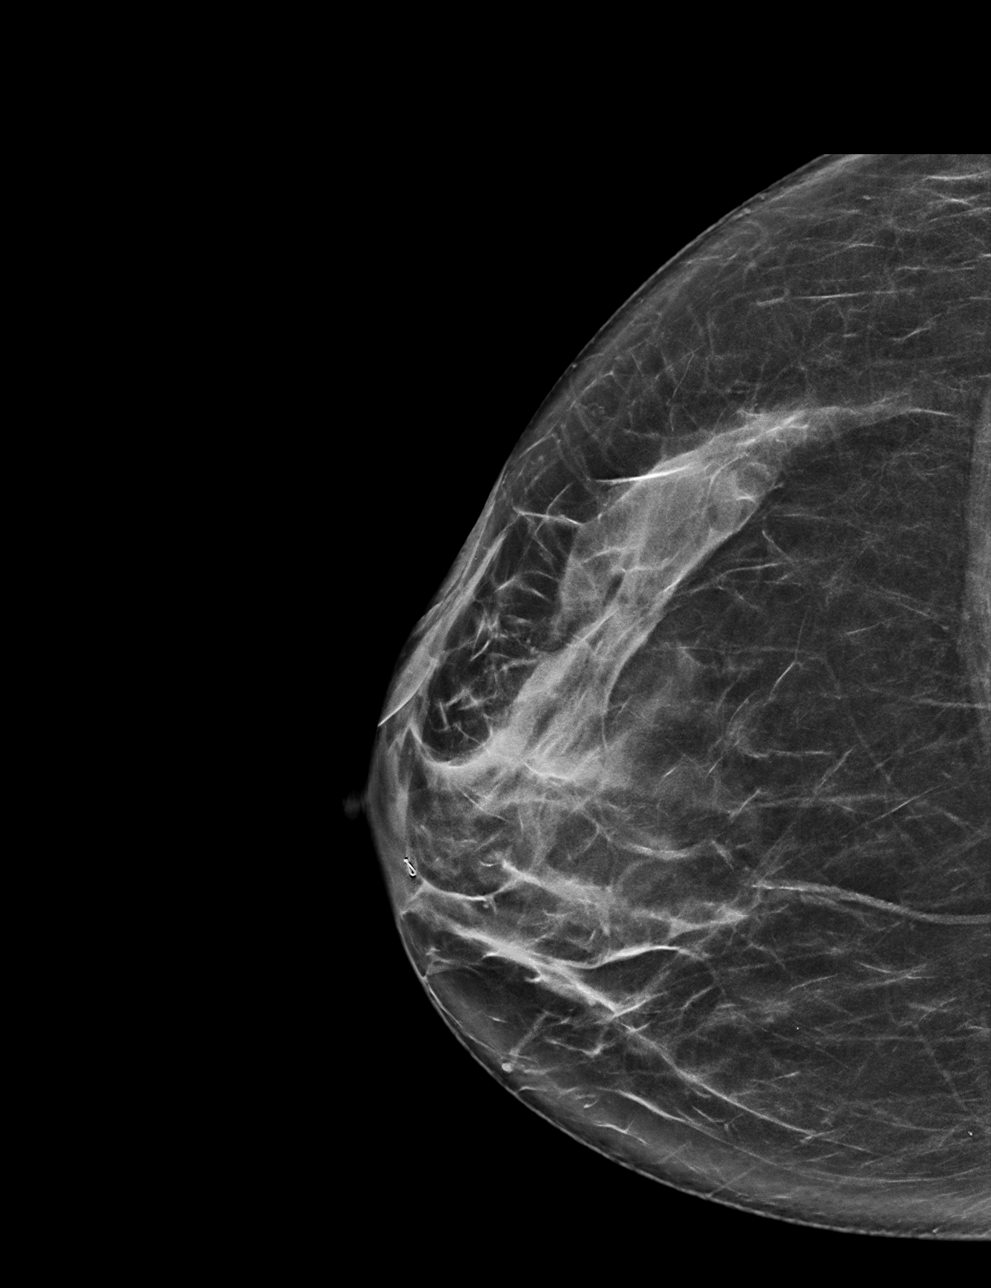

[R ML tomo · tomo slice 33/65.0]
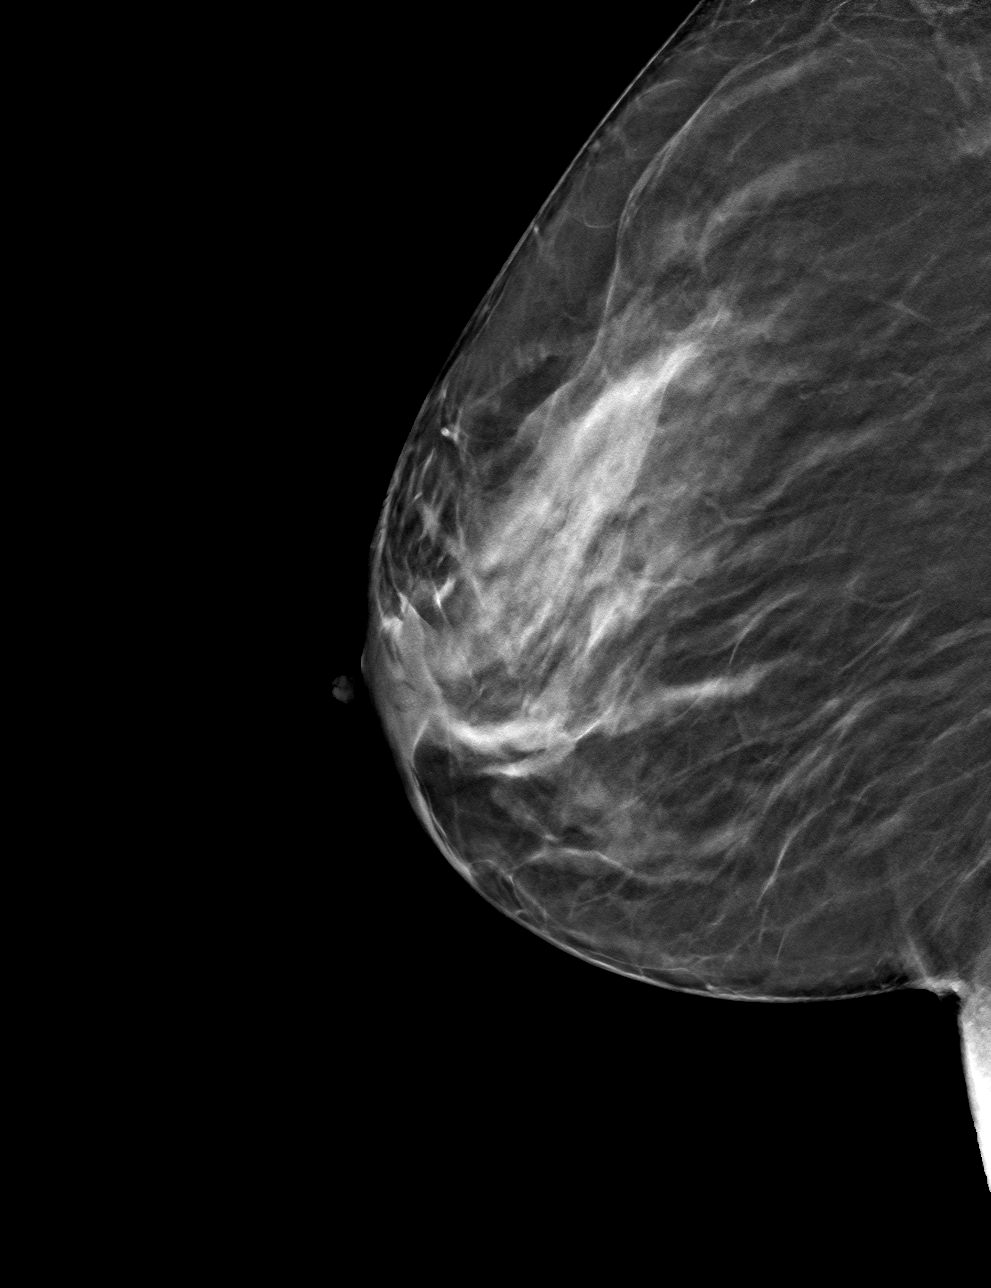

[R CC tomo · tomo slice 33/65.0]
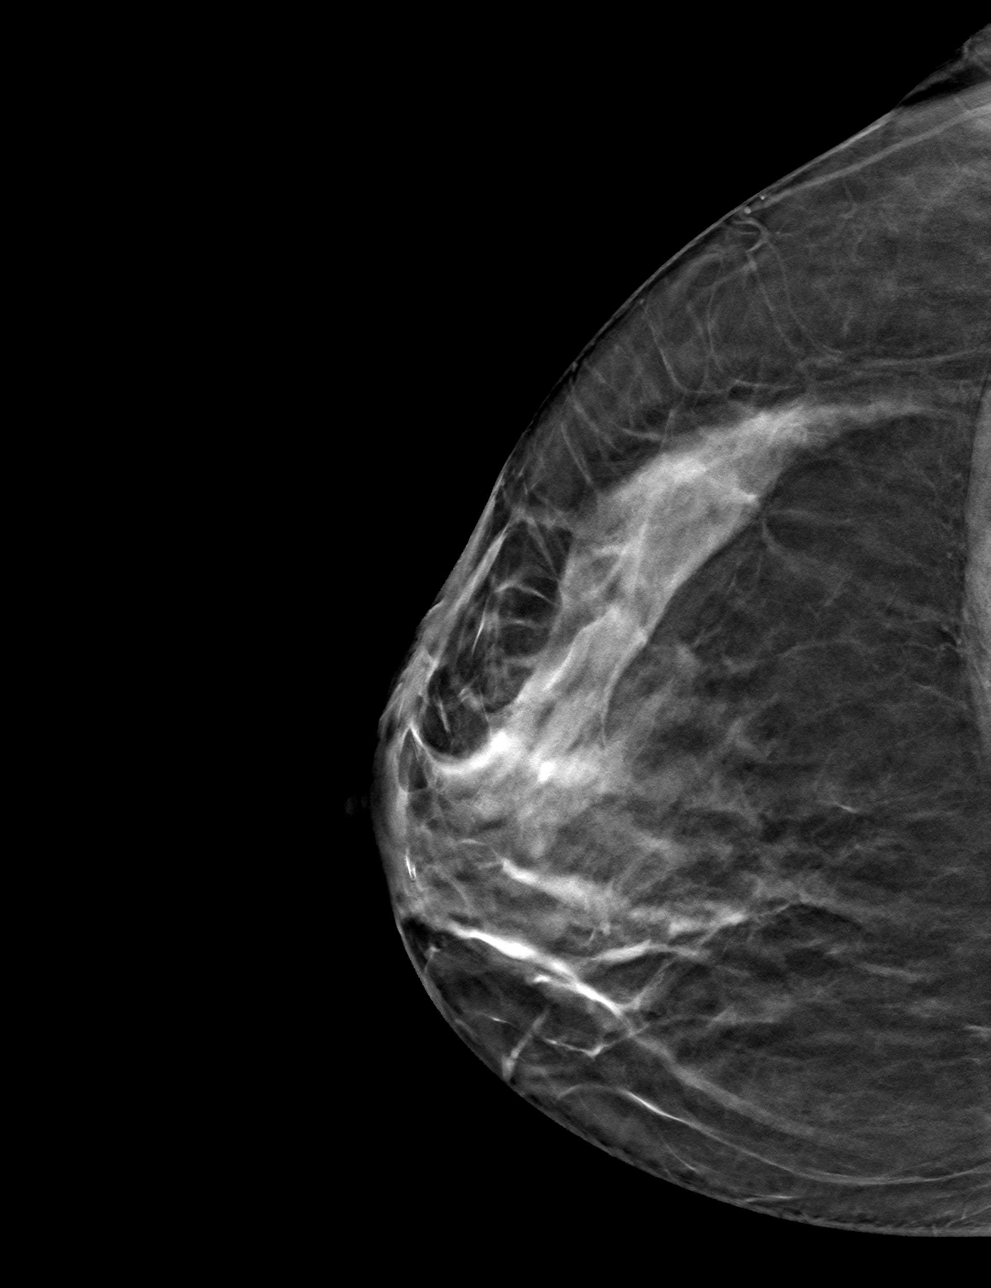

[4 of 12 positions shown; findings below may reference images not displayed]

FINDINGS: 3D Mammographic images were obtained following ultrasound guided
biopsy of right breast mass. The biopsy marking clip is in expected
position at the site of biopsy.
IMPRESSION: Appropriate positioning of the ribbon shaped biopsy marking clip at
the site of biopsy in the retroareolar right breast.

Final Assessment: Post Procedure Mammograms for Marker Placement

## 2021-02-16 IMAGING — US US  BREAST BX W/ LOC DEV 1ST LESION IMG BX SPEC US GUIDE*R*
1 series · 9 of 9 positions shown · non-contrast
Comparison: Previous exam(s).
COMPARISON: Previous exam(s).

Addendum:
CLINICAL DATA: Patient with indeterminate retroareolar right breast
mass.

EXAM:
ULTRASOUND GUIDED RIGHT BREAST CORE NEEDLE BIOPSY

[Series 1: us breast bx w/ loc dev 1st lesion img bx spec us  · 0.04mm/px · 9 of 9 slices shown]
[im 1/9]
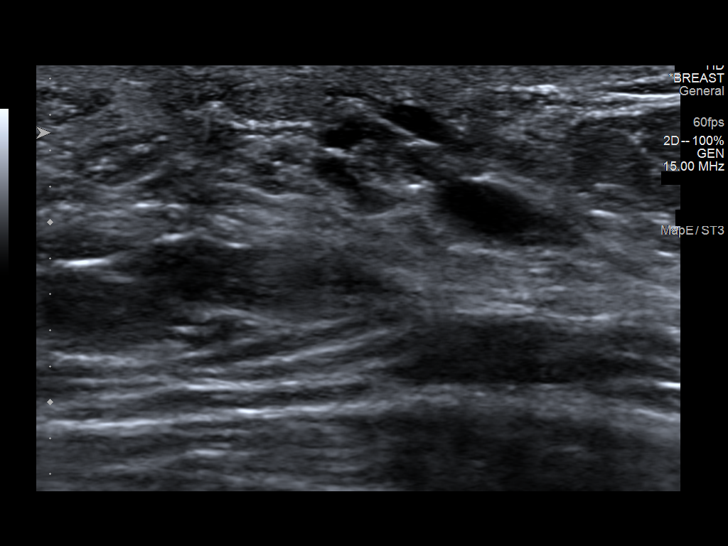
[im 2/9]
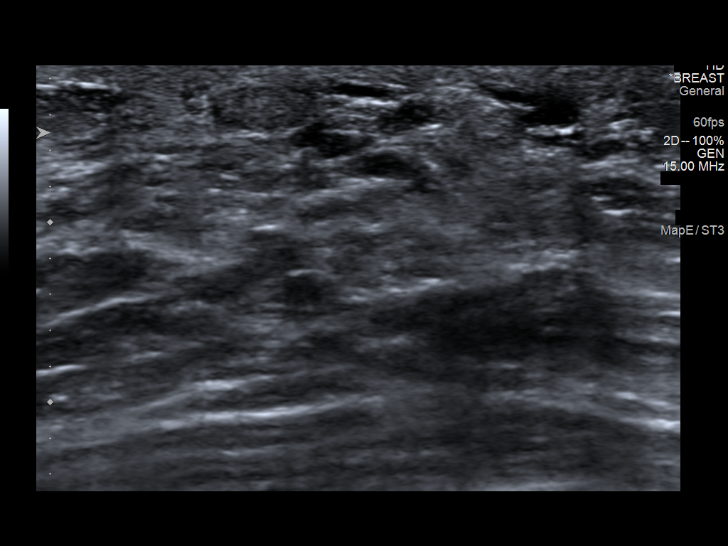
[im 3/9]
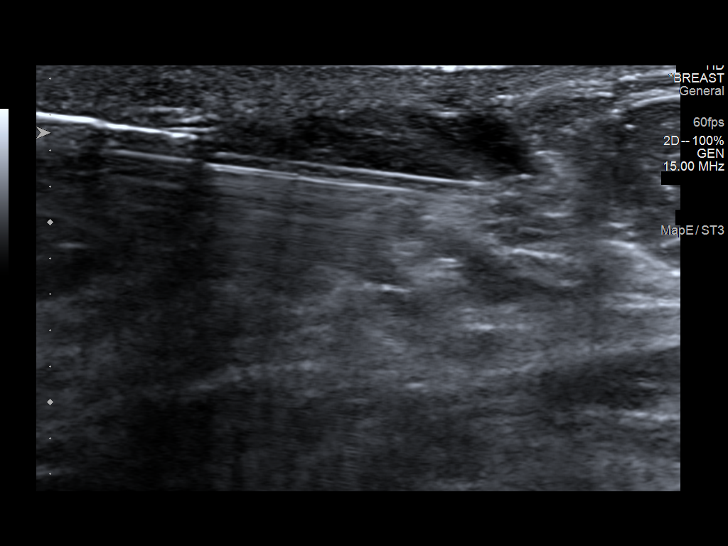
[im 4/9]
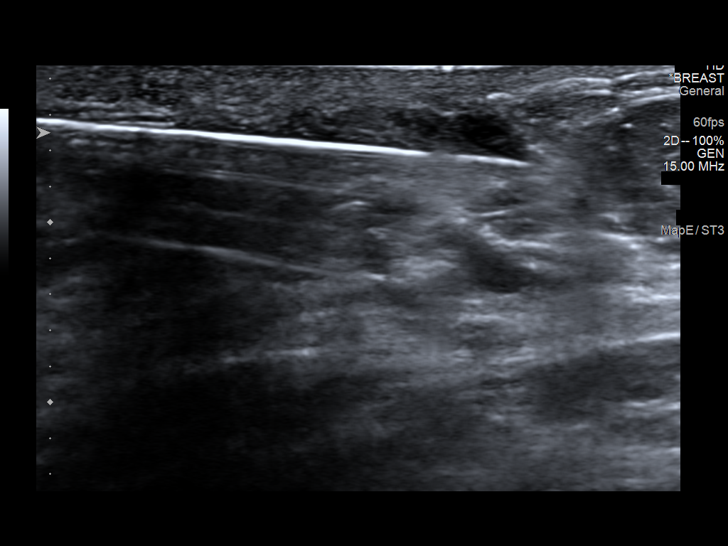
[im 5/9]
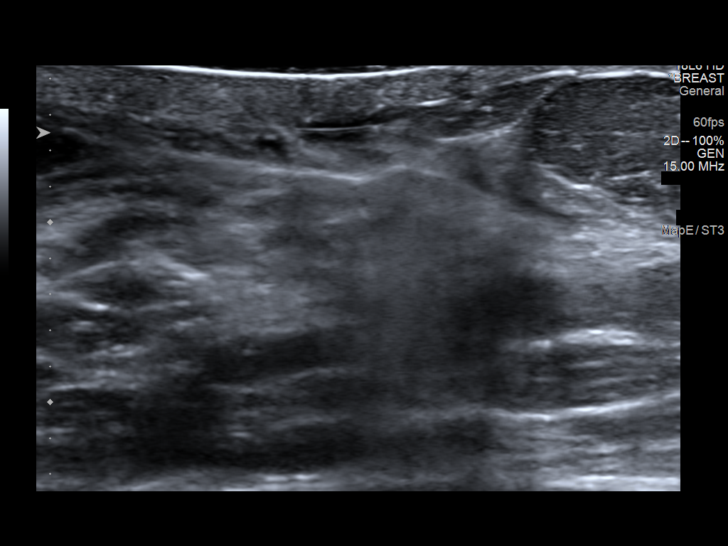
[im 6/9]
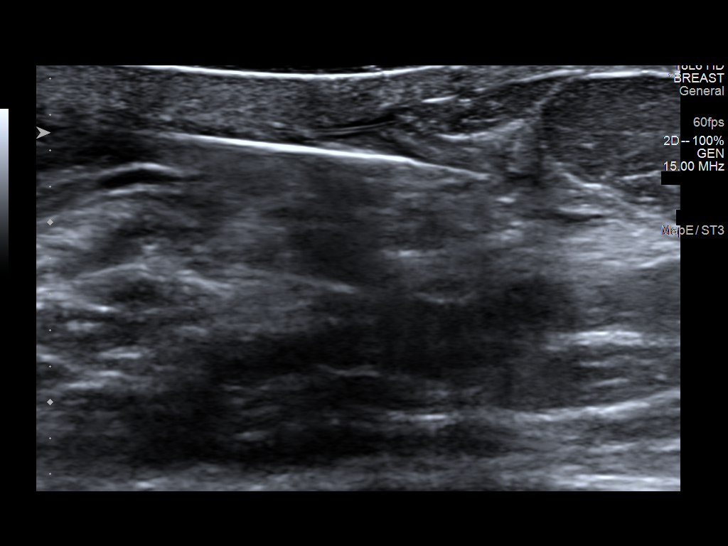
[im 7/9]
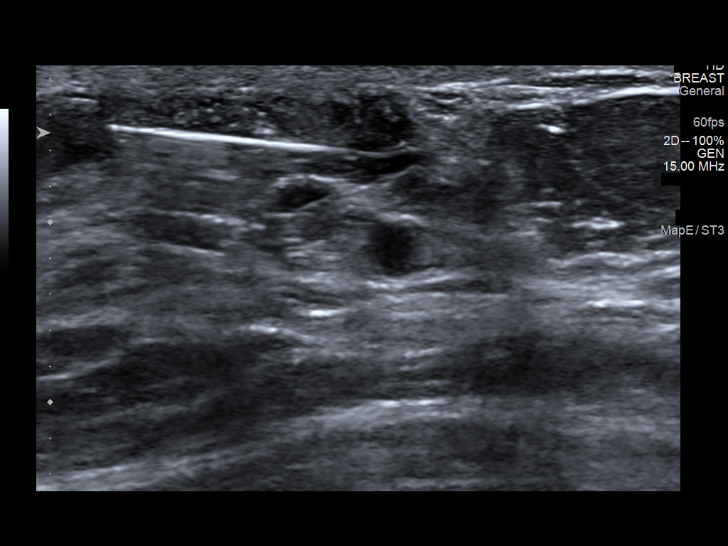
[im 8/9]
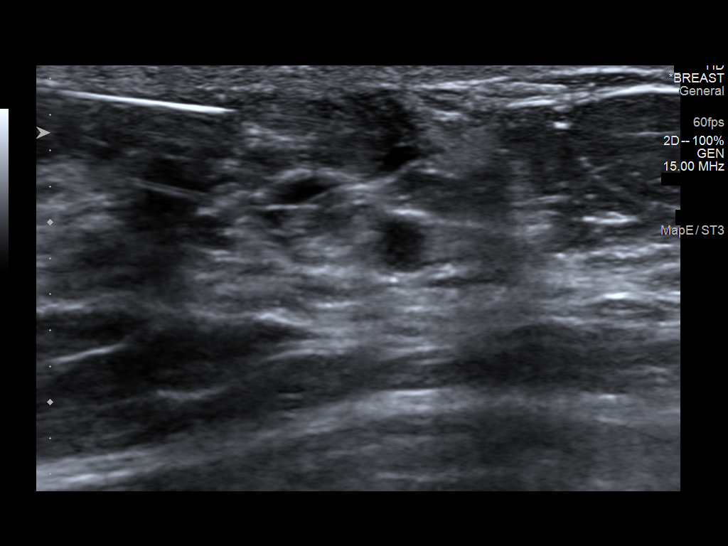
[im 9/9]
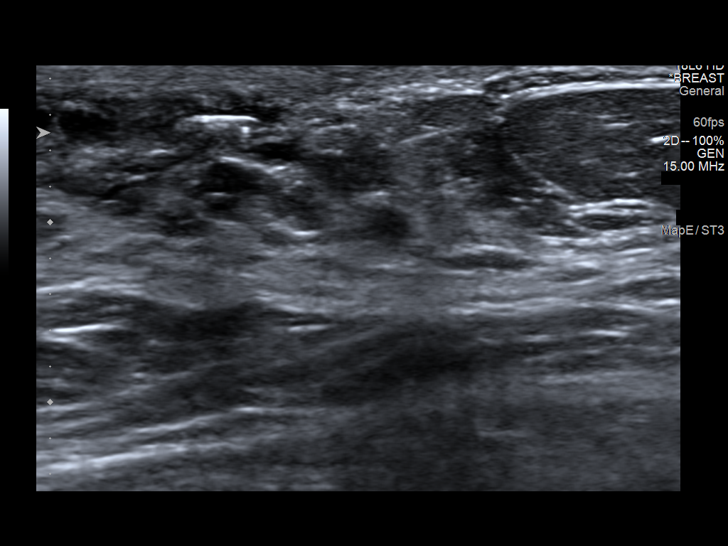

[9 of 9 positions shown; findings below may reference images not displayed]



Lesion quadrant: Upper inner quadrant

Using sterile technique and 1% Lidocaine as local anesthetic, under
direct ultrasound visualization, a 14 gauge KULJIT device was
used to perform biopsy of right breast mass using a lateral
approach. At the conclusion of the procedure ribbon shaped tissue
marker clip was deployed into the biopsy cavity. Follow up 2 view
mammogram was performed and dictated separately.
IMPRESSION: Ultrasound guided biopsy of right breast mass. No apparent
complications.

ADDENDUM:
Pathology revealed FEATURES SUGGESTIVE OF AN INTRADUCTAL PAPILLOMA
of the RIGHT breast, retroareolar, (ribbon clip). This was found to
be concordant by Dr. KULJIT, with excision recommended due to
rapid growth.

Pathology results were discussed with the patient by telephone. The
patient reported doing well after the biopsy with tenderness at the
site. Post biopsy instructions and care were reviewed and questions
were answered. The patient was encouraged to call The [REDACTED] for any additional concerns. My direct phone
number was provided.

The patient was contacted by [REDACTED] on [DATE], to arrange a surgical referral. The patient stated she would
call back to schedule at a later date.

The patient was contacted by The [REDACTED] Scheduling Department
on [REDACTED] and 11th to schedule a LEFT diagnostic mammogram.
The patient stated she would call back to schedule at a later date.

Pathology results reported by KULJIT, RN on [DATE].



Lesion quadrant: Upper inner quadrant

Using sterile technique and 1% Lidocaine as local anesthetic, under
direct ultrasound visualization, a 14 gauge KULJIT device was
used to perform biopsy of right breast mass using a lateral
approach. At the conclusion of the procedure ribbon shaped tissue
marker clip was deployed into the biopsy cavity. Follow up 2 view
mammogram was performed and dictated separately.
IMPRESSION: Ultrasound guided biopsy of right breast mass. No apparent
complications.

## 2021-02-17 ENCOUNTER — Other Ambulatory Visit: Payer: Self-pay | Admitting: Family Medicine

## 2021-02-17 DIAGNOSIS — D369 Benign neoplasm, unspecified site: Secondary | ICD-10-CM

## 2021-02-20 ENCOUNTER — Other Ambulatory Visit: Payer: Managed Care, Other (non HMO)

## 2021-02-22 ENCOUNTER — Other Ambulatory Visit: Payer: Self-pay | Admitting: Neurology

## 2021-02-23 NOTE — Telephone Encounter (Signed)
Received refill request for clonazepam.  Last OV was on 10/09/20.  Next OV is scheduled for 03/10/21 .  Last RX was written on 01/17/21 for 60 tabs.   [place] Drug Database has been reviewed.

## 2021-03-10 ENCOUNTER — Ambulatory Visit: Payer: Managed Care, Other (non HMO) | Admitting: Neurology

## 2021-03-10 ENCOUNTER — Telehealth: Payer: Self-pay | Admitting: Neurology

## 2021-03-10 ENCOUNTER — Encounter: Payer: Self-pay | Admitting: Neurology

## 2021-03-10 ENCOUNTER — Other Ambulatory Visit: Payer: Self-pay

## 2021-03-10 VITALS — BP 112/84 | HR 86 | Ht 66.0 in | Wt 170.0 lb

## 2021-03-10 DIAGNOSIS — G248 Other dystonia: Secondary | ICD-10-CM

## 2021-03-10 DIAGNOSIS — M79671 Pain in right foot: Secondary | ICD-10-CM | POA: Diagnosis not present

## 2021-03-10 DIAGNOSIS — R269 Unspecified abnormalities of gait and mobility: Secondary | ICD-10-CM | POA: Diagnosis not present

## 2021-03-10 NOTE — Progress Notes (Signed)
[facility]  PATIENT: Lauren Levy DOB: 15-Jan-1971  REFERRING DOCTOR OR PCP: Yaakov Guthrie, MD (PCP); Dr. Reatha Armour (Neurosurgery) SOURCE: Patient, notes from Dr. Reatha Armour, imaging reports, MRI images personally reviewed.  _________________________________   HISTORICAL  CHIEF COMPLAINT:  Chief Complaint  Patient presents with   Follow-up    Rm 2, w husband. Pt wanted to discuss and know if its ok to take black seed oil. Pt currently taking trihexyphenidyl 2tab TID and tolerating well. Clonazepam 1x in the evening.     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  Update 03/10/2021: She continues to have frequent episodes of dystonia in the right foot when she walks.  These started in 2022.  As she walks, the toes curl and she has to pick the leg up high in order to walk sometimes the foot will also bend down.  Moving sideways, jogging or dancing does not cause the curling up of the toes.   We have started clonazepam and trihexyphenidyl but she notes just a minimal benefit with the medications.     When the spasms occur they are mildly painful.  She is more concerned about her gait worsening with the dystonia.   With the toes curl up she has difficulty putting weight on the right leg..     She also notes some back pain and pain in the right buttock.    She denies falls.   She denies numbness in the right leg.   She has no major change in bladder function but notes more nocturia.   Interestingly, she seems to walk better right after she wakes up to use the bathroom but then is worse again after she uses the toilet.    She notes that she sleeps better at night with the clonazepam with less fidgeting.  She does not have the dystonia at night when sleeping.  She is not on any neuroleptics or other medication associated with movement disorders.  Imaging and other results: MRI of the brain 09/23/2020 was normal.  Incidental  note of chronic maxillary sinusitis.  MRI of the cervical and thoracic spine 09/23/2020 showed a normal spinal cord.  There is no spinal stenosis.  Mild disc degenerative changes were noted at several levels but there was no nerve root compression.  MRI of the lumbar spine 08/22/2020   By her report, MRI of the right knee and EMG/NCV of the right leg was normal.    REVIEW OF SYSTEMS: Constitutional: No fevers, chills, sweats, or change in appetite Eyes: No visual changes, double vision, eye pain Ear, nose and throat: No hearing loss, ear pain, nasal congestion, sore throat Cardiovascular: No chest pain, palpitations Respiratory:  No shortness of breath at rest or with exertion.   No wheezes GastrointestinaI: No nausea, vomiting, diarrhea, abdominal pain, fecal incontinence Genitourinary:  No dysuria, urinary retention or frequency.  No nocturia. Musculoskeletal:  No neck pain, back pain Integumentary: No rash, pruritus, skin lesions Neurological: as above Psychiatric: No depression at this time.  No anxiety Endocrine: No palpitations, diaphoresis, change in appetite, change in weigh or increased thirst Hematologic/Lymphatic:  No anemia, purpura, petechiae. Allergic/Immunologic: No itchy/runny eyes, nasal congestion, recent allergic reactions, rashes  ALLERGIES: No Known Allergies  HOME MEDICATIONS:  Current Outpatient Medications:    Cholecalciferol (VITAMIN D3) 125 MCG (5000 UT) CAPS, Take 1 tablet by mouth daily., Disp: , Rfl:    clonazePAM (KLONOPIN) 0.5 MG tablet, TAKE ONE  TABLET BY MOUTH TWICE A DAY AS NEEDED FOR ANXIETY, Disp: 60 tablet, Rfl: 0   Krill Oil (OMEGA-3) 500 MG CAPS, Take 1 tablet by mouth 2 (two) times daily. Omega Woman w evening Primrose oil And 76mg  GLA, Disp: , Rfl:    Magnesium 400 MG CAPS, Take 1 capsule by mouth 2 (two) times daily., Disp: , Rfl:    Multiple Vitamins-Minerals (CENTRUM SILVER 50+WOMEN) TABS, Take 1 tablet by mouth daily., Disp: , Rfl:     trihexyphenidyl (ARTANE) 2 MG tablet, Restart at 2 mg TID to ensure tolerates. If tolerates may increase to 1-2 tablet TID., Disp: 180 tablet, Rfl: 5   vitamin B-12 (CYANOCOBALAMIN) 1000 MCG tablet, Take 1 tablet by mouth daily., Disp: , Rfl:   PAST MEDICAL HISTORY: Past Medical History:  Diagnosis Date   Foot drop    Ovarian cyst     PAST SURGICAL HISTORY: Past Surgical History:  Procedure Laterality Date   MINOR CARPAL TUNNEL Right     FAMILY HISTORY: Family History  Problem Relation Age of Onset   High blood pressure Mother    Osteoporosis Mother    Arthritis Mother    Arthritis Father    Osteoporosis Father    Breast cancer Paternal Aunt     SOCIAL HISTORY:  Social History   Socioeconomic History   Marital status: Married    Spouse name: Claiborne Billings   Number of children: 3   Years of education: Not on file   Highest education level: Bachelor's degree (e.g., BA, AB, BS)  Occupational History   Not on file  Tobacco Use   Smoking status: Former    Types: Cigarettes    Quit date: 2005    Years since quitting: 18.0   Smokeless tobacco: Never  Substance and Sexual Activity   Alcohol use: Yes    Comment: occ   Drug use: No   Sexual activity: Not on file  Other Topics Concern   Not on file  Social History Narrative   Live with husband and children   Left handed   Caffeine: 2 cups of tea a day, 1 soda a day   Social Determinants of Health   Financial Resource Strain: Not on file  Food Insecurity: Not on file  Transportation Needs: Not on file  Physical Activity: Not on file  Stress: Not on file  Social Connections: Not on file  Intimate Partner Violence: Not on file     PHYSICAL EXAM  Vitals:   03/10/21 1046  BP: 112/84  Pulse: 86  Weight: 170 lb (77.1 kg)  Height: 5\' 6"  (1.676 m)    Body mass index is 27.44 kg/m.   General: The patient is well-developed and well-nourished and in no acute distress  HEENT:  Head is [place]/AT.  Sclera are anicteric.     Skin: Extremities are without rash or  edema.  Musculoskeletal:  Back is slightly tender over the right piriformis muscle.  No tenderness over the lumbar paraspinals.  Neurologic Exam  Mental status: The patient is alert and oriented x 3 at the time of the examination. The patient has apparent normal recent and remote memory, with an apparently normal attention span and concentration ability.   Speech is normal.  Cranial nerves: Extraocular movements are full.  Facial symmetry is present. There is good facial sensation to soft touch bilaterally.Facial strength is normal.  Trapezius and sternocleidomastoid strength is normal. No dysarthria is noted.  No obvious hearing deficits are noted.  Motor:  Muscle  bulk is normal.   Tone is normal.  While sitting down, strength is  5 / 5 in all 4 extremities.   Other: When she walks, the toes curled down and the ankle plantar flexes.   This does not occur if she walks backwards or to the side.  Sensory: Sensory testing is intact to pinprick, soft touch and vibration sensation in all 4 extremities.  Coordination: Cerebellar testing reveals good finger-nose-finger and heel-to-shin bilaterally.  Gait and station: Upon standing, due to the dystonia, she walks on the right toes .     Romberg is negative.   Reflexes: Deep tendon reflexes are symmetric and normal bilaterally.      DIAGNOSTIC DATA (LABS, IMAGING, TESTING) - I reviewed patient records, labs, notes, testing and imaging myself where available.  Lab Results  Component Value Date   WBC 5.7 03/26/2016   HGB 11.3 (L) 03/26/2016   HCT 34.2 (L) 03/26/2016   MCV 91.7 03/26/2016   PLT 326 03/26/2016      Component Value Date/Time   NA 139 03/26/2016 1915   K 4.2 03/26/2016 1915   CL 105 03/26/2016 1915   CO2 28 03/26/2016 1915   GLUCOSE 98 03/26/2016 1915   BUN 15 03/26/2016 1915   CREATININE 0.67 03/26/2016 1915   CALCIUM 9.2 03/26/2016 1915   PROT 7.4 03/26/2016 1915   ALBUMIN  4.1 03/26/2016 1915   AST 20 03/26/2016 1915   ALT 17 03/26/2016 1915   ALKPHOS 49 03/26/2016 1915   BILITOT 0.4 03/26/2016 1915   GFRNONAA >60 03/26/2016 1915   GFRAA >60 03/26/2016 1915      ASSESSMENT AND PLAN  Focal dystonia  Gait disturbance  Foot pain, right  Dystonia only minimally better with trihexylphenidyl and clonazepam Botox for focal dystonia 3.   Follow-up for Botox.  Call sooner if new or worsening symptoms.   Jhon Mallozzi A. Felecia Shelling, MD, [facility] 08/14/2374, 2:83 PM Certified in Neurology, Clinical Neurophysiology, Sleep Medicine and Neuroimaging  [facility] [address], [place], [place] [address] [phone]

## 2021-03-10 NOTE — Telephone Encounter (Signed)
Faxed signed PA form with OV notes to [place].

## 2021-03-10 NOTE — Telephone Encounter (Signed)
Received Charge sheet to start patient on Botox. Called patients insurance plan PA is required for Botox. Completed PA form for Botox and placed in Nurse Pod for MD signature.

## 2021-03-18 ENCOUNTER — Other Ambulatory Visit: Payer: Self-pay

## 2021-03-18 ENCOUNTER — Ambulatory Visit
Admission: RE | Admit: 2021-03-18 | Discharge: 2021-03-18 | Disposition: A | Discharge status: Home / Self Care | Payer: Managed Care, Other (non HMO) | Source: Ambulatory Visit | Attending: Family Medicine | Admitting: Family Medicine

## 2021-03-18 DIAGNOSIS — D369 Benign neoplasm, unspecified site: Secondary | ICD-10-CM

## 2021-03-18 IMAGING — MG MM DIGITAL DIAGNOSTIC UNILAT*L* W/ TOMO W/ CAD
4 series · 4 of 12 positions shown · non-contrast
Comparison: Previous exams.

CLINICAL DATA: 51-year-old female with history of recently
diagnosed intraductal papilloma in the right breast presents for
screening of the left breast prior to planned excision.

EXAM:
DIGITAL DIAGNOSTIC UNILATERAL LEFT MAMMOGRAM WITH TOMOSYNTHESIS AND
CAD
TECHNIQUE: Left digital diagnostic mammography and breast tomosynthesis was
performed. The images were evaluated with computer-aided detection.

[L CC synth-2D]
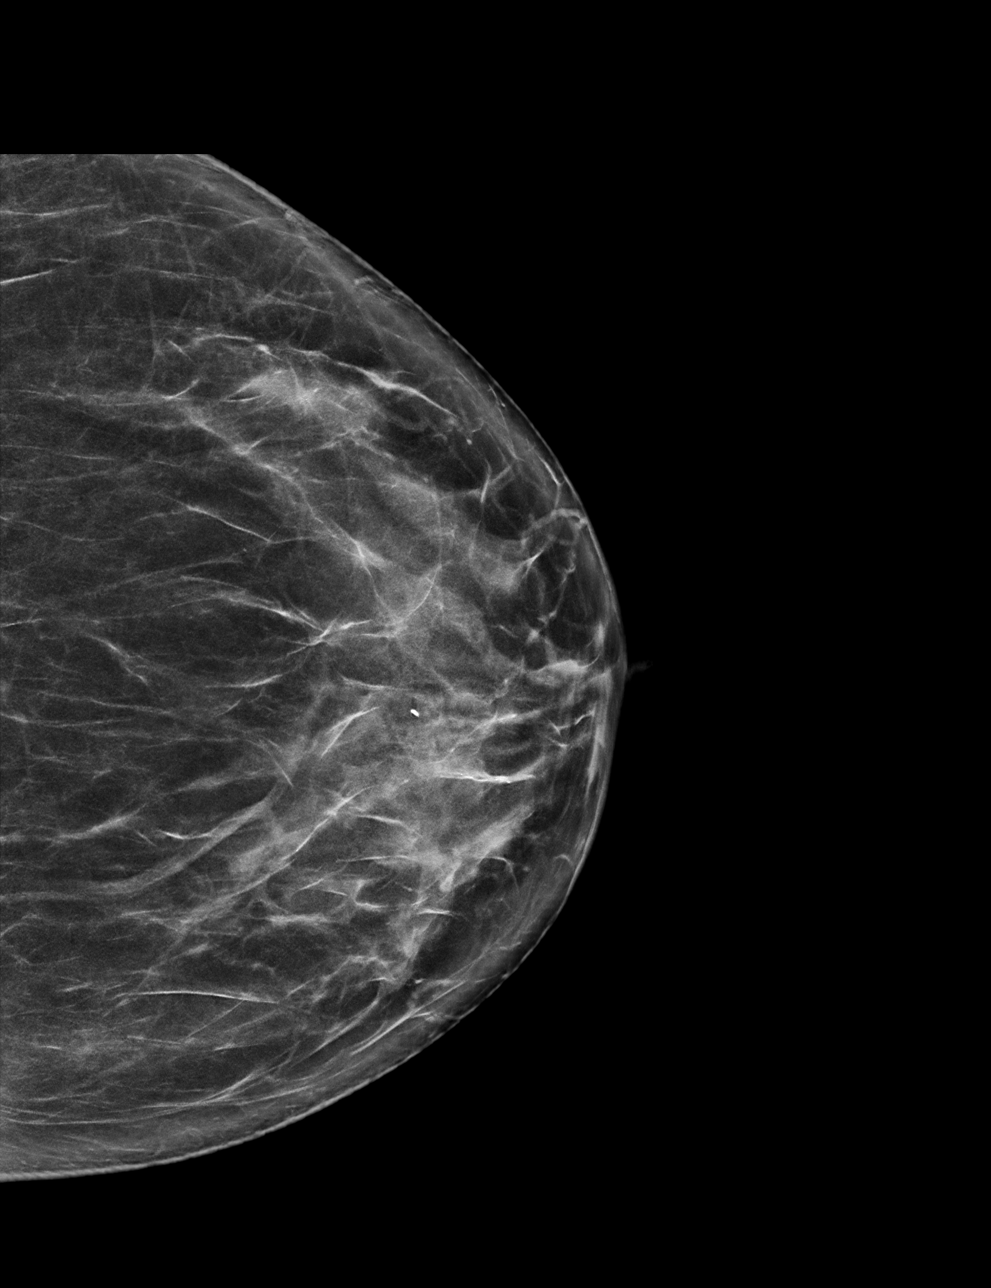

[L MLO synth-2D]
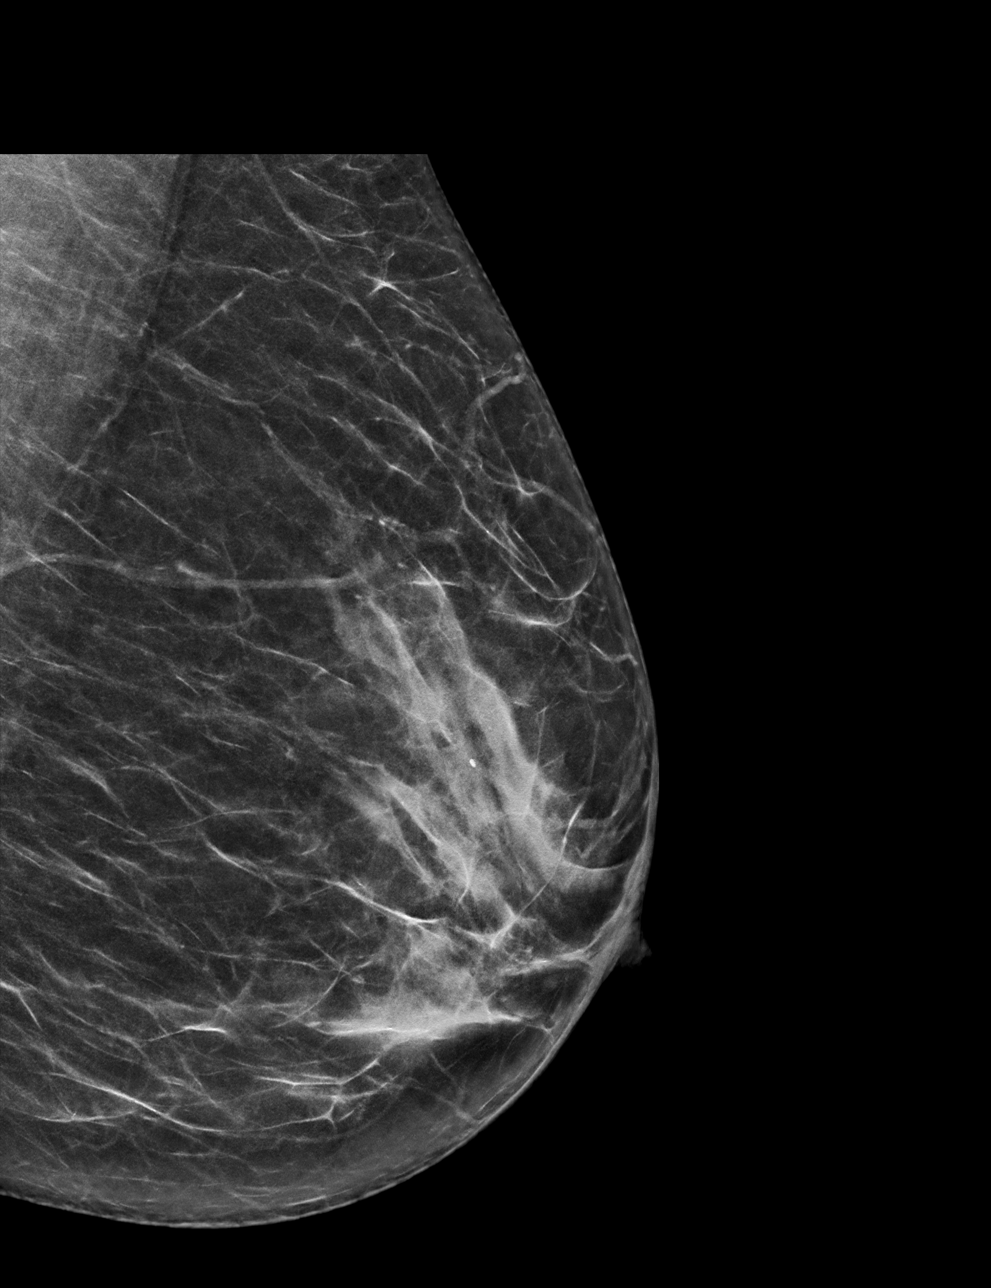

[L CC tomo · tomo slice 33/66.0]
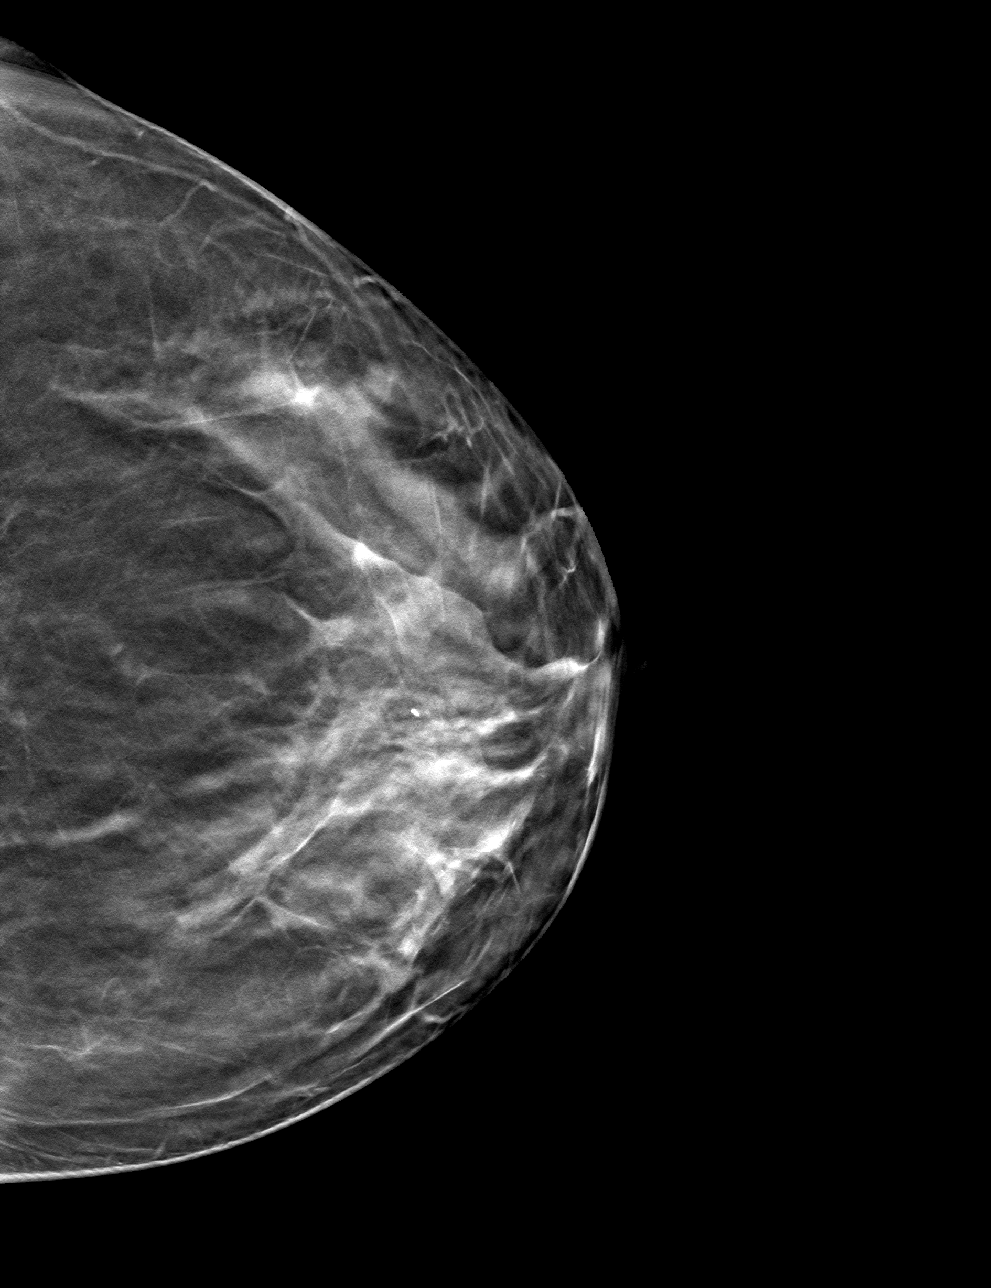

[L MLO tomo · tomo slice 31/60.0]
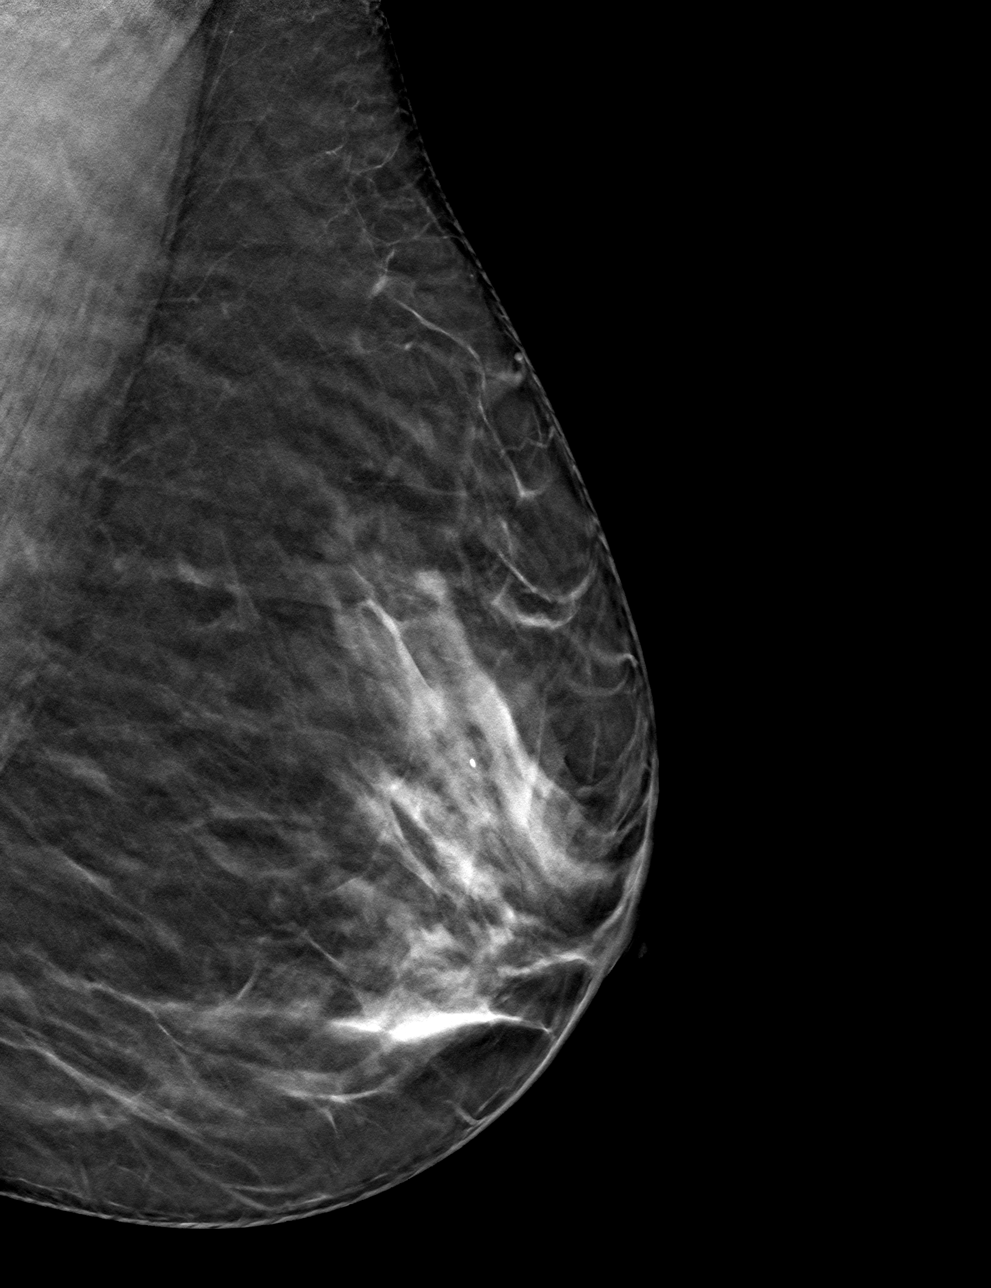

[4 of 12 positions shown; findings below may reference images not displayed]

ACR Breast Density Category c: The breast tissue is heterogeneously
dense, which may obscure small masses.
FINDINGS: No suspicious masses or calcifications seen in the left breast.
There is no mammographic evidence of malignancy.
IMPRESSION: No mammographic evidence of malignancy.

RECOMMENDATION:
Recommend annual screening mammography of the bilateral breasts
which will be due [DATE].

I have discussed the findings and recommendations with the patient.
If applicable, a reminder letter will be sent to the patient
regarding the next appointment.

BI-RADS CATEGORY  1: Negative.

## 2021-03-23 ENCOUNTER — Ambulatory Visit: Payer: Self-pay | Admitting: Surgery

## 2021-03-23 DIAGNOSIS — D241 Benign neoplasm of right breast: Secondary | ICD-10-CM

## 2021-03-25 ENCOUNTER — Other Ambulatory Visit: Payer: Self-pay | Admitting: Surgery

## 2021-03-25 DIAGNOSIS — D241 Benign neoplasm of right breast: Secondary | ICD-10-CM

## 2021-03-31 NOTE — Telephone Encounter (Signed)
I called patient, Lm to get Botox appointment scheduled.

## 2021-03-31 NOTE — Telephone Encounter (Signed)
Received letter from [facility] is not required for pt's Botox.

## 2021-03-31 NOTE — Telephone Encounter (Signed)
Please send Botox Rx to [facility].

## 2021-03-31 NOTE — Telephone Encounter (Signed)
How many units did Dr. Felecia Shelling request? We will need that prior to sending in rx, thank you!

## 2021-03-31 NOTE — Telephone Encounter (Signed)
100 units.

## 2021-04-01 MED ORDER — ONABOTULINUMTOXINA 100 UNITS IJ SOLR: 100.0000 [IU] | Freq: Once | INTRAMUSCULAR | 0 refills | Status: DC

## 2021-04-01 NOTE — Addendum Note (Signed)
Addended byRonnald Ramp, Wong Steadham L on: 04/01/2021 07:00 AM   Modules accepted: Orders

## 2021-04-02 ENCOUNTER — Other Ambulatory Visit: Payer: Self-pay

## 2021-04-02 ENCOUNTER — Encounter (HOSPITAL_BASED_OUTPATIENT_CLINIC_OR_DEPARTMENT_OTHER): Payer: Self-pay | Admitting: Surgery

## 2021-04-13 ENCOUNTER — Ambulatory Visit
Admission: RE | Admit: 2021-04-13 | Discharge: 2021-04-13 | Disposition: A | Discharge status: Home / Self Care | Payer: Managed Care, Other (non HMO) | Source: Ambulatory Visit | Attending: Surgery | Admitting: Surgery

## 2021-04-13 DIAGNOSIS — D241 Benign neoplasm of right breast: Secondary | ICD-10-CM

## 2021-04-13 IMAGING — MG MM PLC BREAST LOC DEV 1ST LESION INC*R*
8 of 10 series · 8 of 10 positions shown · non-contrast
Comparison: Previous exam(s).

CLINICAL DATA: Localization prior to surgery.

EXAM:
MAMMOGRAPHIC GUIDED RADIOACTIVE SEED LOCALIZATION OF THE RIGHT
BREAST

[R CC (1 of 3)]
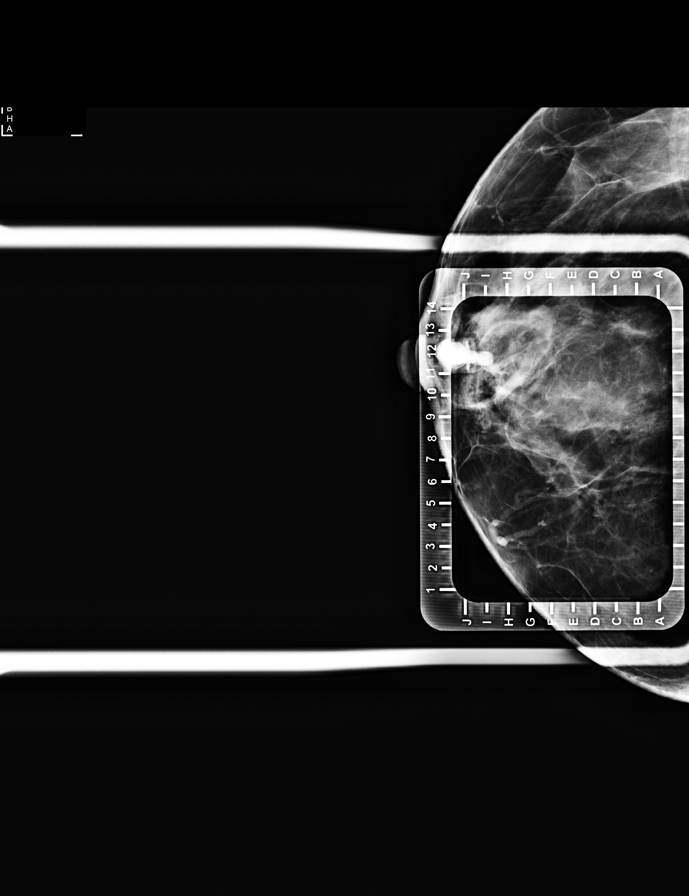

[R ML (1 of 5)]
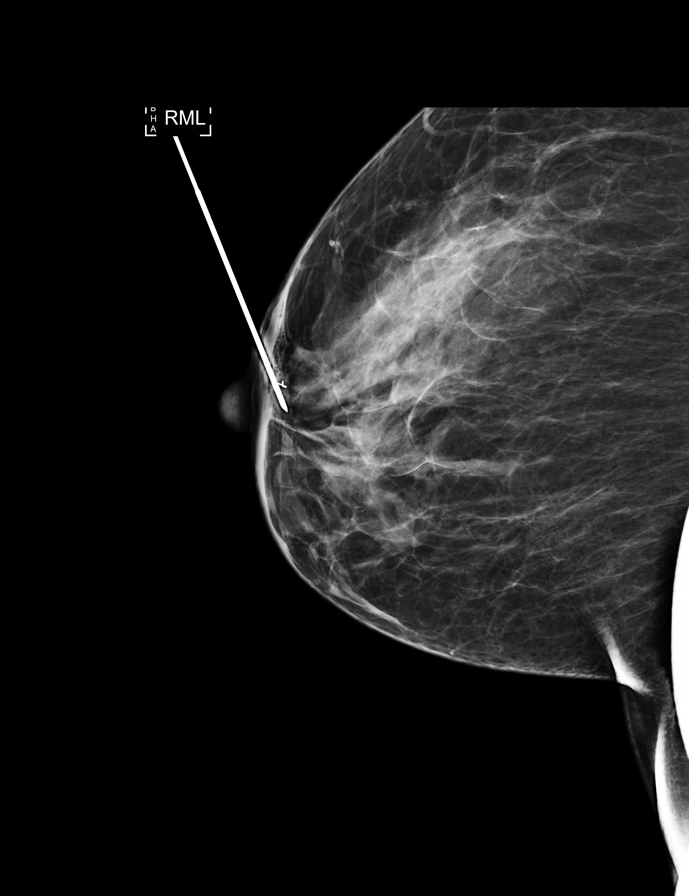

[R ML (2 of 5)]
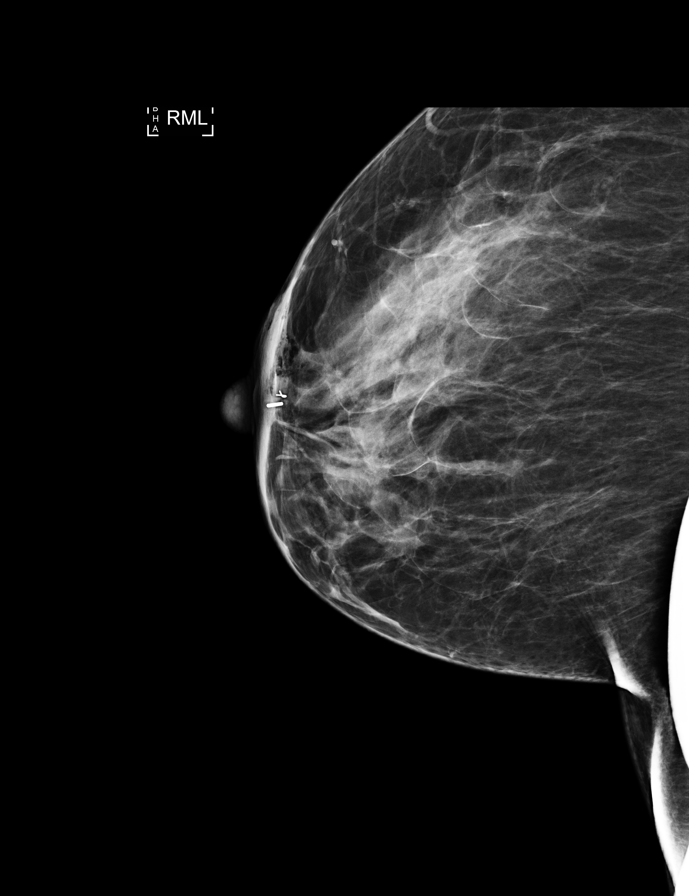

[R ML (3 of 5)]
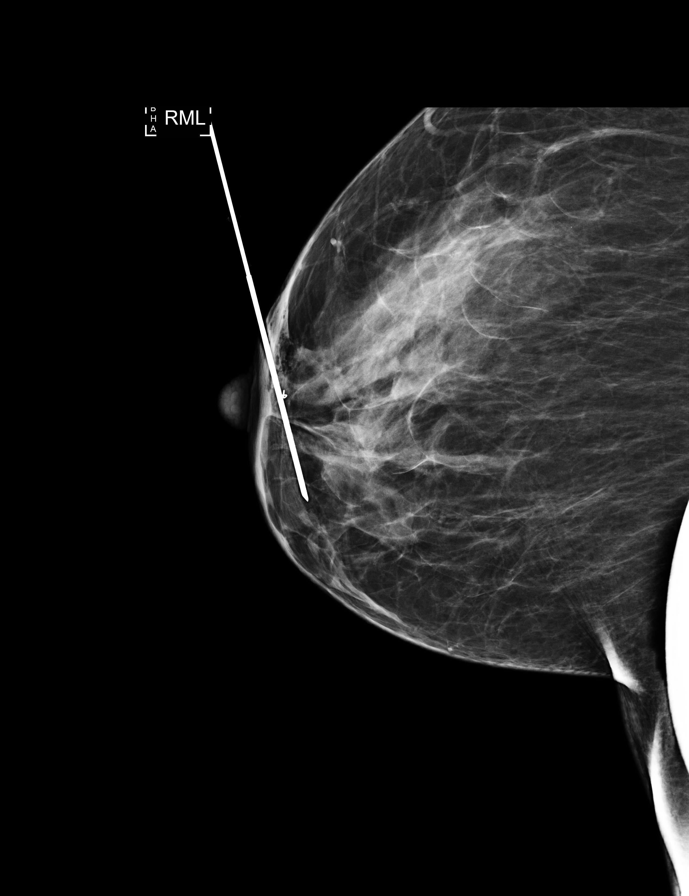

[R CC (2 of 3)]
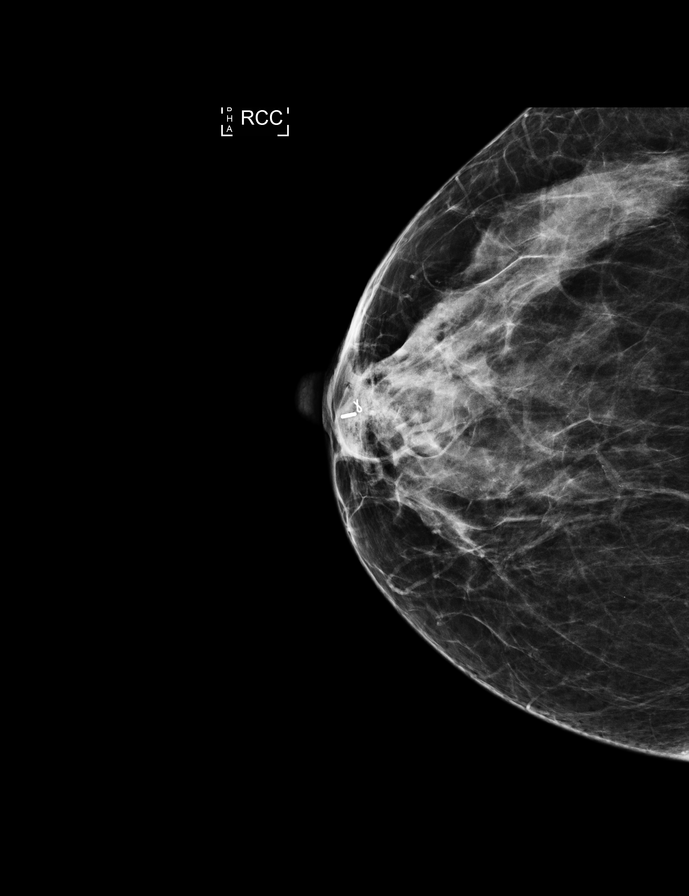

[R ML (4 of 5)]
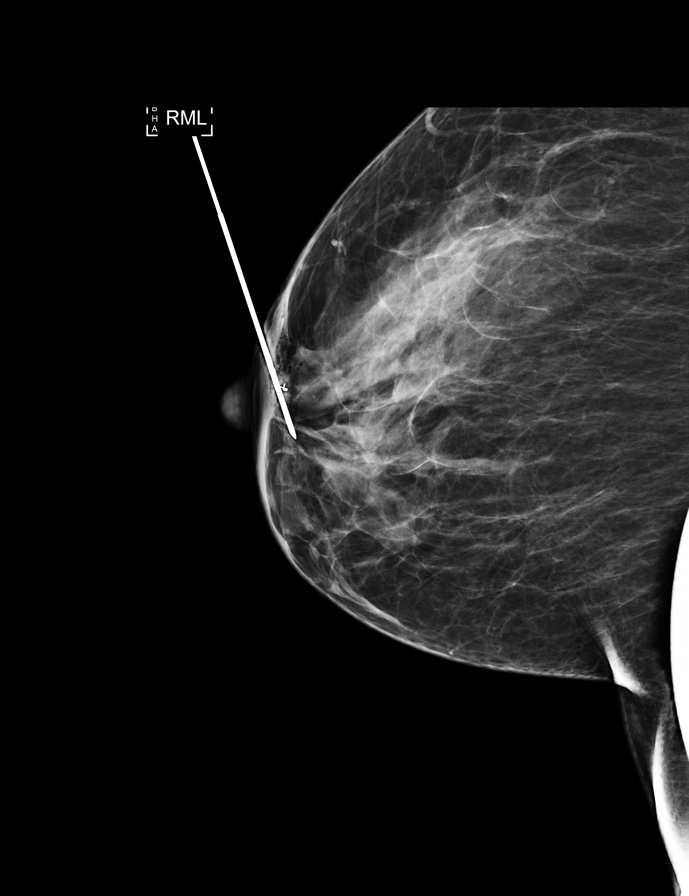

[R ML (5 of 5)]
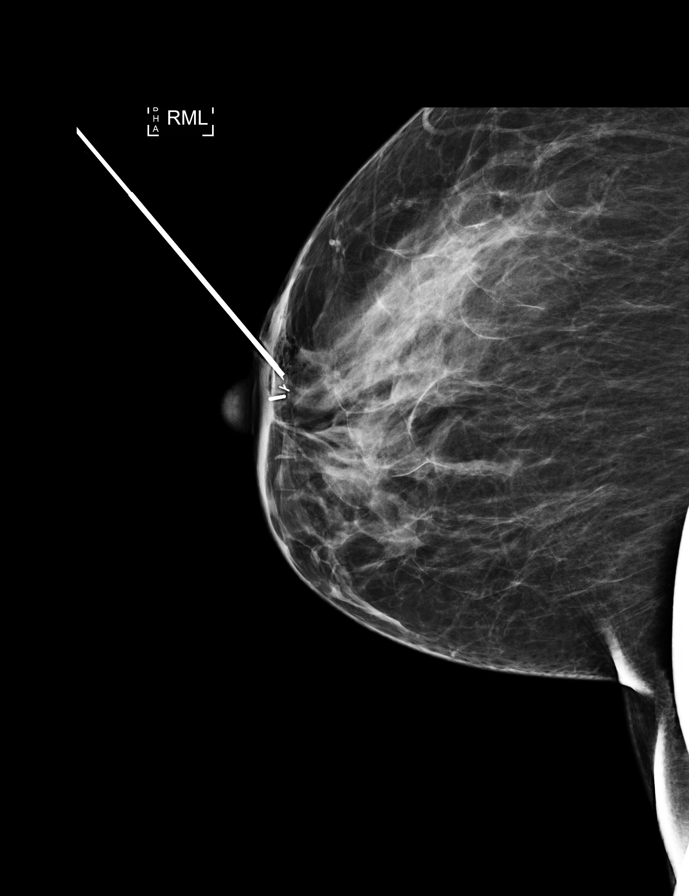

[R CC (3 of 3)]
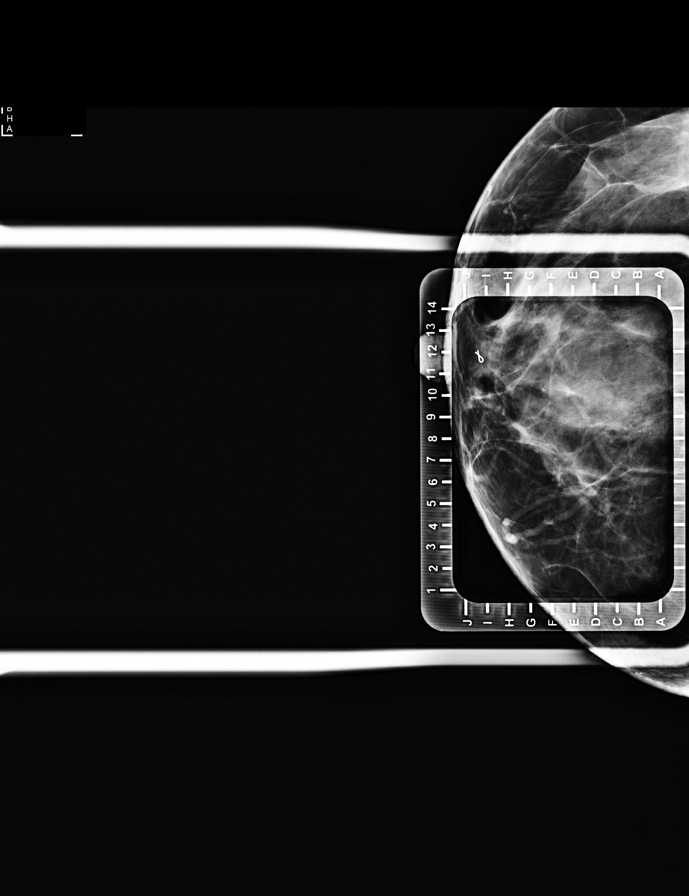

[8 of 10 positions shown; findings below may reference images not displayed]



The usual time-out protocol was performed immediately prior to the
procedure.

Using mammographic guidance, sterile technique, 1% lidocaine and an
[JW] radioactive seed, the ribbon shaped clip was localized using a
superior to inferior approach. The follow-up mammogram images
confirm the seed in the expected location and were marked for Dr.
LUFI.

Follow-up survey of the patient confirms presence of the radioactive
seed.

Order number of [JW] seed:  [PHONE_NUMBER].

Total activity:  0.251 millicuries reference Date: [DATE]

The patient tolerated the procedure well and was released from the
[REDACTED]. She was given instructions regarding seed removal.
IMPRESSION: Radioactive seed localization right breast. No apparent
complications.

## 2021-04-13 NOTE — Progress Notes (Addendum)
Sent reminder for patient to come pick up pre surgery drink.  ? ?Surgical soap given with instructions, pt verbalized understanding. Enhanced Recovery after Surgery  ?Enhanced Recovery after Surgery is a protocol used to improve the stress on your body and your recovery after surgery. ? ?Patient Instructions ? ?The night before surgery:  ?No food after midnight. ONLY clear liquids after midnight ? ?The day of surgery (if you do NOT have diabetes):  ?Drink ONE (1) Pre-Surgery Clear Ensure as directed.   ?This drink was given to you during your hospital  ?pre-op appointment visit. ?The pre-op nurse will instruct you on the time to drink the  ?Pre-Surgery Ensure depending on your surgery time. ?Finish the drink at the designated time by the pre-op nurse.  ?Nothing else to drink after completing the  ?Pre-Surgery Clear Ensure. ? ?The day of surgery (if you have diabetes): ?Drink ONE (1) Gatorade 2 (G2) as directed. ?This drink was given to you during your hospital  ?pre-op appointment visit.  ?The pre-op nurse will instruct you on the time to drink the  ? Gatorade 2 (G2) depending on your surgery time. ?Color of the Gatorade may vary. Red is not allowed. ?Nothing else to drink after completing the  ?Gatorade 2 (G2). ? ?       If office.you have questions, please contact your surgeon?s office  ?

## 2021-04-13 NOTE — Anesthesia Preprocedure Evaluation (Addendum)
Anesthesia Evaluation  ?Patient identified by MRN, date of birth, ID band ?Patient awake ? ? ? ?Reviewed: ?Allergy & Precautions, NPO status , Patient's Chart, lab work & pertinent test results ? ?History of Anesthesia Complications ?Negative for: history of anesthetic complications ? ?Airway ?Mallampati: I ? ?TM Distance: >3 FB ?Neck ROM: Full ? ? ? Dental ? ?(+) Dental Advisory Given, Teeth Intact ?  ?Pulmonary ?former smoker,  ?  ?Pulmonary exam normal ? ? ? ? ? ? ? Cardiovascular ?negative cardio ROS ?Normal cardiovascular exam ? ? ?  ?Neuro/Psych ?PSYCHIATRIC DISORDERS Anxiety negative neurological ROS ?   ? GI/Hepatic ?negative GI ROS, Neg liver ROS,   ?Endo/Other  ?negative endocrine ROS ? Renal/GU ?negative Renal ROS  ? ?  ?Musculoskeletal ?negative musculoskeletal ROS ?(+)  ? Abdominal ?  ?Peds ? Hematology ?negative hematology ROS ?(+)   ?Anesthesia Other Findings ? ? Reproductive/Obstetrics ? ?  ? ? ? ? ? ? ? ? ? ? ? ? ? ?  ?  ? ? ? ? ? ? ? ?Anesthesia Physical ?Anesthesia Plan ? ?ASA: 1 ? ?Anesthesia Plan: General  ? ?Post-op Pain Management: Tylenol PO (pre-op)* and Celebrex PO (pre-op)*  ? ?Induction: Intravenous ? ?PONV Risk Score and Plan: 3 and Treatment may vary due to age or medical condition, Ondansetron, Dexamethasone, Midazolam and Scopolamine patch - Pre-op ? ?Airway Management Planned: LMA ? ?Additional Equipment: None ? ?Intra-op Plan:  ? ?Post-operative Plan: Extubation in OR ? ?Informed Consent: I have reviewed the patients History and Physical, chart, labs and discussed the procedure including the risks, benefits and alternatives for the proposed anesthesia with the patient or authorized representative who has indicated his/her understanding and acceptance.  ? ? ? ?Dental advisory given ? ?Plan Discussed with: CRNA and Anesthesiologist ? ?Anesthesia Plan Comments:   ? ? ? ? ? ?Anesthesia Quick Evaluation ? ?

## 2021-04-14 ENCOUNTER — Other Ambulatory Visit: Payer: Self-pay

## 2021-04-14 ENCOUNTER — Encounter (HOSPITAL_BASED_OUTPATIENT_CLINIC_OR_DEPARTMENT_OTHER): Payer: Self-pay | Admitting: Surgery

## 2021-04-14 ENCOUNTER — Encounter (HOSPITAL_BASED_OUTPATIENT_CLINIC_OR_DEPARTMENT_OTHER)
Admission: RE | Disposition: A | Discharge status: Home / Self Care | Payer: Self-pay | Source: Home / Self Care | Attending: Surgery

## 2021-04-14 ENCOUNTER — Ambulatory Visit (HOSPITAL_BASED_OUTPATIENT_CLINIC_OR_DEPARTMENT_OTHER): Payer: Commercial Managed Care - HMO | Admitting: Anesthesiology

## 2021-04-14 ENCOUNTER — Ambulatory Visit (HOSPITAL_BASED_OUTPATIENT_CLINIC_OR_DEPARTMENT_OTHER)
Admission: RE | Admit: 2021-04-14 | Discharge: 2021-04-14 | Disposition: A | Discharge status: Home / Self Care | Payer: Commercial Managed Care - HMO | Attending: Surgery | Admitting: Surgery

## 2021-04-14 ENCOUNTER — Ambulatory Visit
Admission: RE | Admit: 2021-04-14 | Discharge: 2021-04-14 | Disposition: A | Discharge status: Home / Self Care | Payer: Managed Care, Other (non HMO) | Source: Ambulatory Visit | Attending: Surgery | Admitting: Surgery

## 2021-04-14 DIAGNOSIS — F419 Anxiety disorder, unspecified: Secondary | ICD-10-CM | POA: Diagnosis not present

## 2021-04-14 DIAGNOSIS — D241 Benign neoplasm of right breast: Secondary | ICD-10-CM

## 2021-04-14 DIAGNOSIS — N6041 Mammary duct ectasia of right breast: Secondary | ICD-10-CM | POA: Insufficient documentation

## 2021-04-14 DIAGNOSIS — Z87891 Personal history of nicotine dependence: Secondary | ICD-10-CM | POA: Diagnosis not present

## 2021-04-14 HISTORY — PX: BREAST LUMPECTOMY WITH RADIOACTIVE SEED LOCALIZATION: SHX6424

## 2021-04-14 HISTORY — PX: BREAST LUMPECTOMY: SHX2

## 2021-04-14 IMAGING — MG MM BREAST SURGICAL SPECIMEN
1 series · 2 of 2 positions shown · non-contrast
Comparison: Previous exam(s).

CLINICAL DATA: Status post surgical excision today after earlier
radioactive seed localization.

EXAM:
SPECIMEN RADIOGRAPH OF THE RIGHT BREAST

[Series 2: R · right · 0.07mm/px · 2 of 2 slices shown]
[im 1/2]
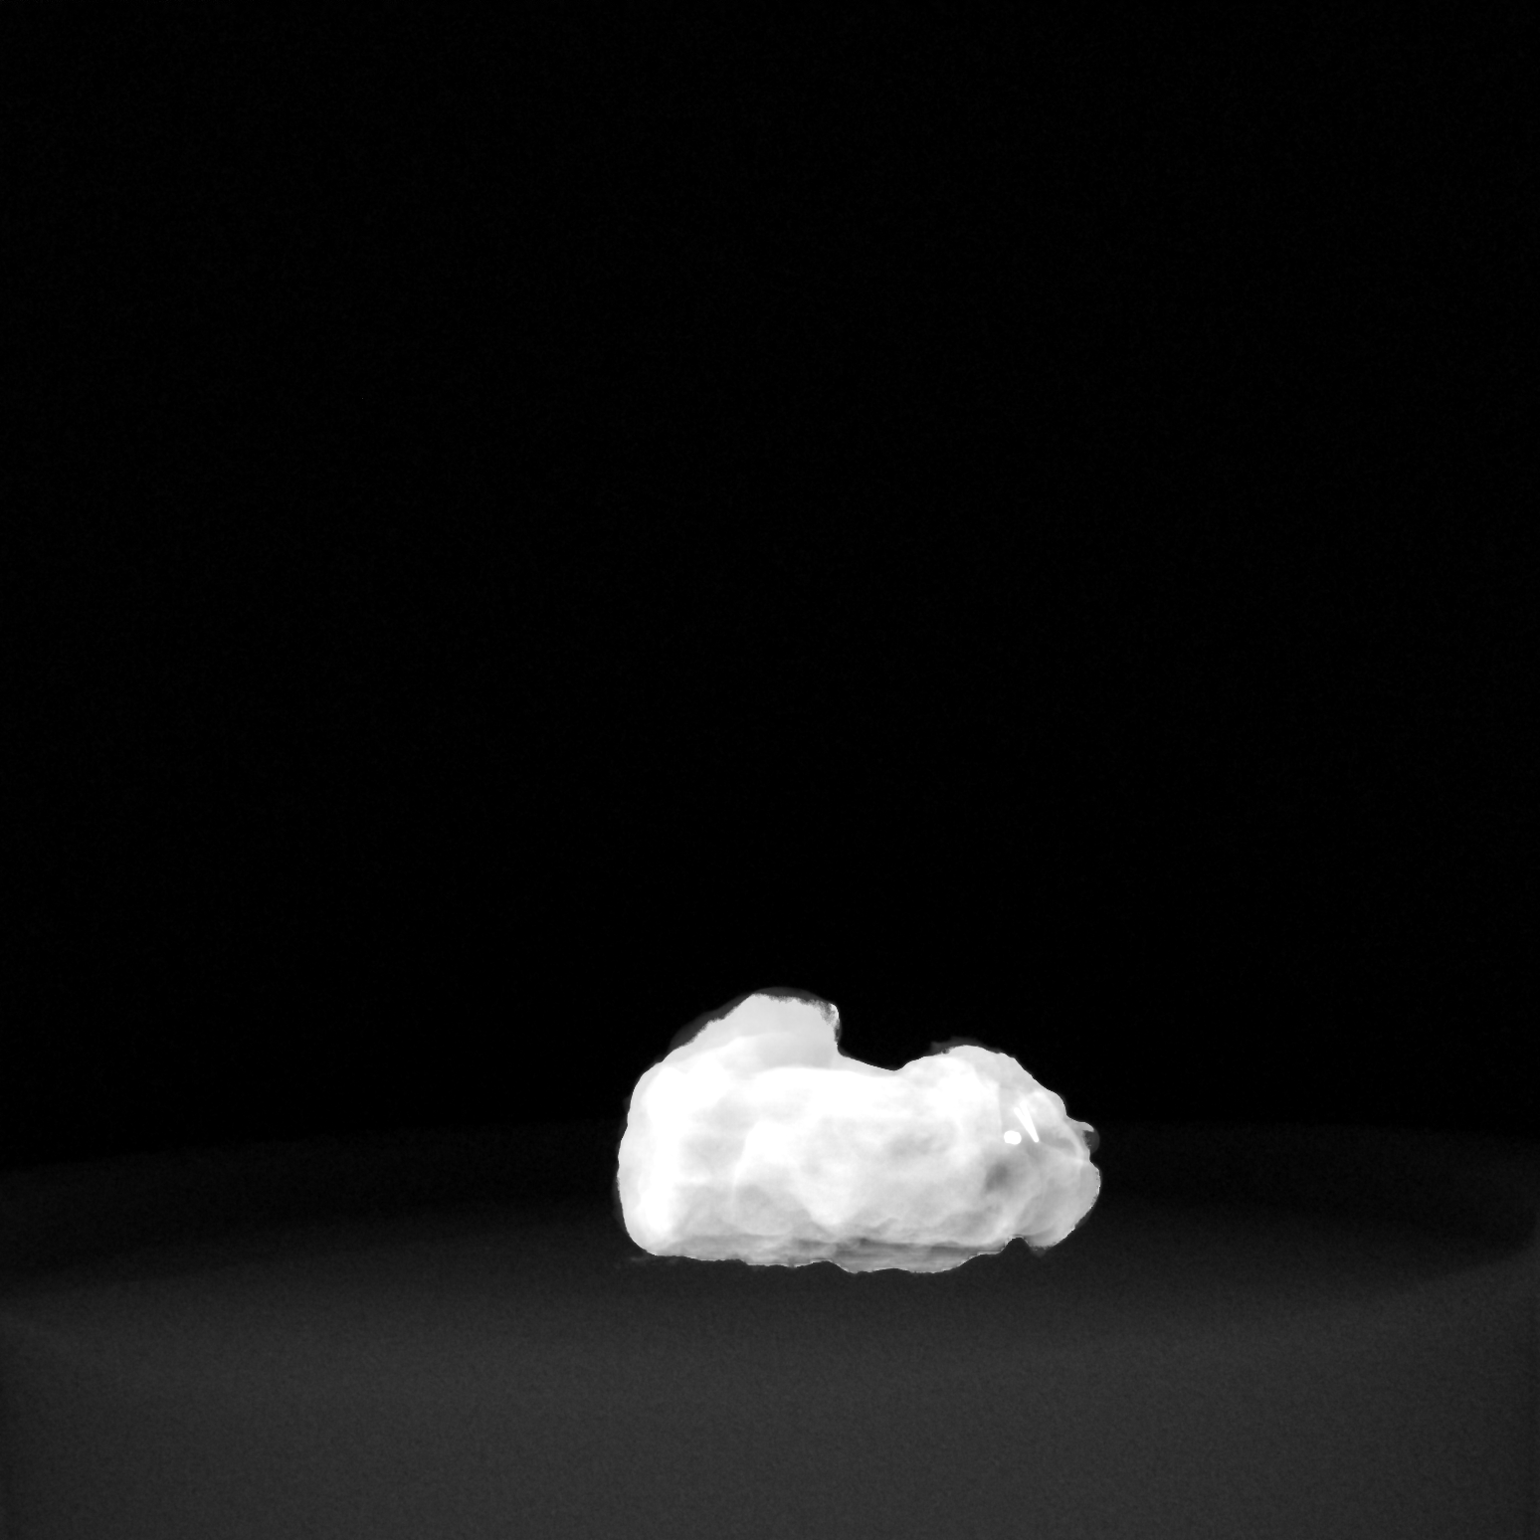
[im 2/2]
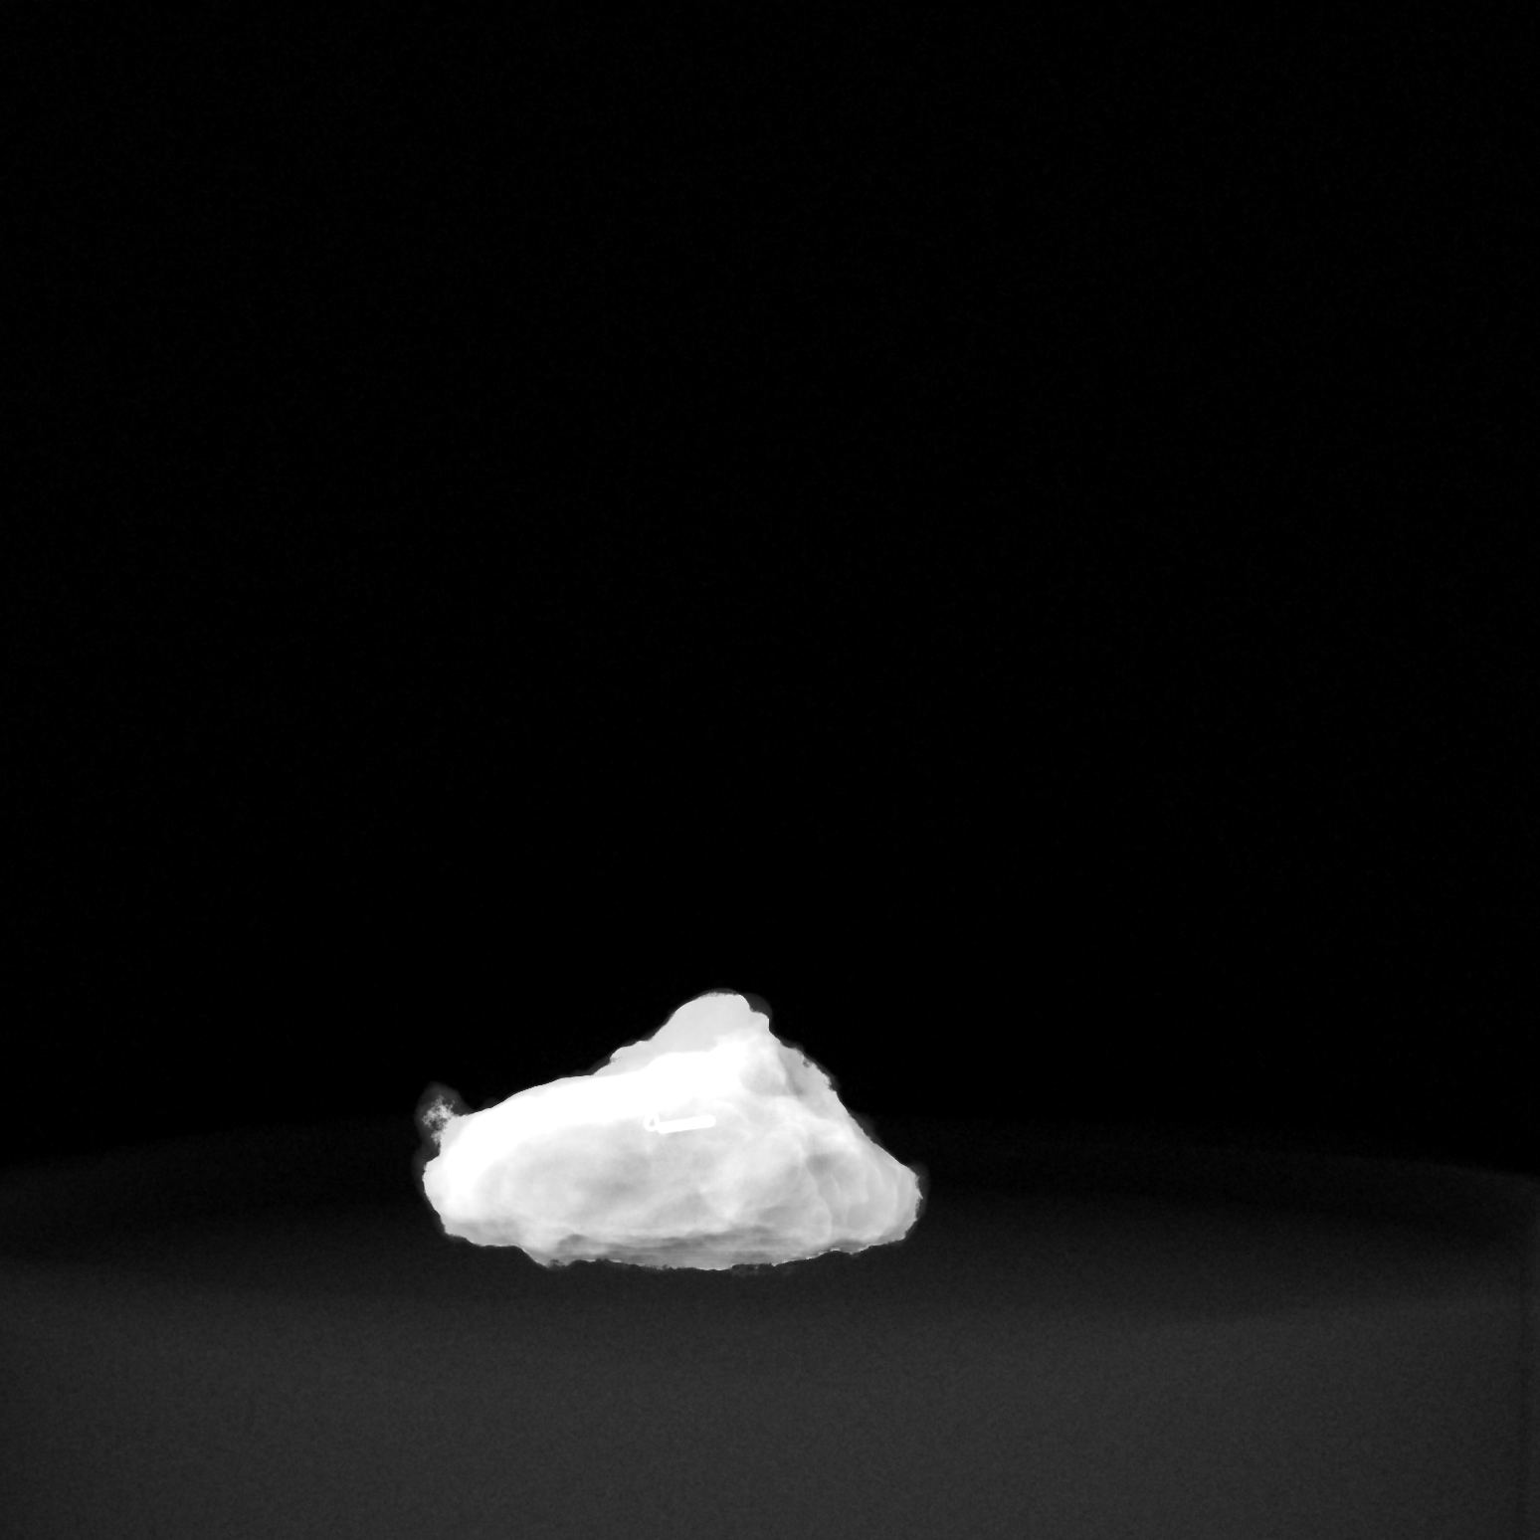

[2 of 2 positions shown; findings below may reference images not displayed]

FINDINGS: Status post excision of the right breast. The radioactive seed and
biopsy marker clip are present and completely intact within the
specimen. Findings discussed with the OR staff during the procedure.
IMPRESSION: Specimen radiograph of the right breast.

## 2021-04-14 SURGERY — BREAST LUMPECTOMY WITH RADIOACTIVE SEED LOCALIZATION
Anesthesia: General | Site: Breast | Laterality: Right

## 2021-04-14 MED ORDER — FENTANYL CITRATE (PF) 100 MCG/2ML IJ SOLN
INTRAMUSCULAR | Status: DC | PRN
  Administered 2021-04-14: 50 ug via INTRAVENOUS

## 2021-04-14 MED ORDER — ONDANSETRON HCL 4 MG/2ML IJ SOLN
INTRAMUSCULAR | Status: AC
  Filled 2021-04-14: qty 2

## 2021-04-14 MED ORDER — PROPOFOL 10 MG/ML IV BOLUS
INTRAVENOUS | Status: AC
  Filled 2021-04-14: qty 20

## 2021-04-14 MED ORDER — DEXAMETHASONE SODIUM PHOSPHATE 10 MG/ML IJ SOLN
INTRAMUSCULAR | Status: DC | PRN
  Administered 2021-04-14: 4 mg via INTRAVENOUS

## 2021-04-14 MED ORDER — BUPIVACAINE-EPINEPHRINE (PF) 0.25% -1:200000 IJ SOLN
INTRAMUSCULAR | Status: DC | PRN
  Administered 2021-04-14: 12 mL

## 2021-04-14 MED ORDER — CHLORHEXIDINE GLUCONATE CLOTH 2 % EX PADS: 6.0000 | MEDICATED_PAD | Freq: Once | CUTANEOUS | Status: DC

## 2021-04-14 MED ORDER — OXYCODONE HCL 5 MG PO TABS: 5.0000 mg | ORAL_TABLET | Freq: Once | ORAL | Status: DC | PRN

## 2021-04-14 MED ORDER — LIDOCAINE HCL (CARDIAC) PF 100 MG/5ML IV SOSY
PREFILLED_SYRINGE | INTRAVENOUS | Status: DC | PRN
  Administered 2021-04-14: 60 mg via INTRATRACHEAL

## 2021-04-14 MED ORDER — SODIUM CHLORIDE 0.9 % IV SOLN
INTRAVENOUS | Status: AC
  Filled 2021-04-14: qty 10

## 2021-04-14 MED ORDER — FENTANYL CITRATE (PF) 100 MCG/2ML IJ SOLN: 25.0000 ug | INTRAMUSCULAR | Status: DC | PRN

## 2021-04-14 MED ORDER — MIDAZOLAM HCL 5 MG/5ML IJ SOLN
INTRAMUSCULAR | Status: DC | PRN
  Administered 2021-04-14: 2 mg via INTRAVENOUS

## 2021-04-14 MED ORDER — CELECOXIB 200 MG PO CAPS
ORAL_CAPSULE | ORAL | Status: AC
  Filled 2021-04-14: qty 1

## 2021-04-14 MED ORDER — CEFAZOLIN SODIUM-DEXTROSE 2-4 GM/100ML-% IV SOLN
2.0000 g | INTRAVENOUS | Status: AC
  Administered 2021-04-14: 2 g via INTRAVENOUS

## 2021-04-14 MED ORDER — ONDANSETRON HCL 4 MG/2ML IJ SOLN
INTRAMUSCULAR | Status: DC | PRN
  Administered 2021-04-14: 4 mg via INTRAVENOUS

## 2021-04-14 MED ORDER — BUPIVACAINE-EPINEPHRINE (PF) 0.25% -1:200000 IJ SOLN
INTRAMUSCULAR | Status: AC
  Filled 2021-04-14: qty 30

## 2021-04-14 MED ORDER — CEFAZOLIN SODIUM-DEXTROSE 2-4 GM/100ML-% IV SOLN
INTRAVENOUS | Status: AC
  Filled 2021-04-14: qty 100

## 2021-04-14 MED ORDER — ACETAMINOPHEN 500 MG PO TABS: 1000.0000 mg | ORAL_TABLET | ORAL | Status: AC

## 2021-04-14 MED ORDER — MIDAZOLAM HCL 2 MG/2ML IJ SOLN
INTRAMUSCULAR | Status: AC
  Filled 2021-04-14: qty 2

## 2021-04-14 MED ORDER — CELECOXIB 200 MG PO CAPS
200.0000 mg | ORAL_CAPSULE | Freq: Once | ORAL | Status: AC
  Administered 2021-04-14: 200 mg via ORAL

## 2021-04-14 MED ORDER — LACTATED RINGERS IV SOLN: INTRAVENOUS | Status: DC

## 2021-04-14 MED ORDER — ACETAMINOPHEN 500 MG PO TABS
ORAL_TABLET | ORAL | Status: AC
  Filled 2021-04-14: qty 2

## 2021-04-14 MED ORDER — ACETAMINOPHEN 500 MG PO TABS
1000.0000 mg | ORAL_TABLET | Freq: Once | ORAL | Status: AC
  Administered 2021-04-14: 1000 mg via ORAL

## 2021-04-14 MED ORDER — OXYCODONE HCL 5 MG PO TABS: 5.0000 mg | ORAL_TABLET | Freq: Four times a day (QID) | ORAL | 0 refills | Status: DC | PRN

## 2021-04-14 MED ORDER — IBUPROFEN 800 MG PO TABS: 800.0000 mg | ORAL_TABLET | Freq: Three times a day (TID) | ORAL | 0 refills | Status: DC | PRN

## 2021-04-14 MED ORDER — PROPOFOL 10 MG/ML IV BOLUS
INTRAVENOUS | Status: DC | PRN
  Administered 2021-04-14: 200 mg via INTRAVENOUS

## 2021-04-14 MED ORDER — PROCHLORPERAZINE EDISYLATE 10 MG/2ML IJ SOLN: 10.0000 mg | Freq: Once | INTRAMUSCULAR | Status: DC | PRN

## 2021-04-14 MED ORDER — FENTANYL CITRATE (PF) 100 MCG/2ML IJ SOLN
INTRAMUSCULAR | Status: AC
  Filled 2021-04-14: qty 2

## 2021-04-14 MED ORDER — OXYCODONE HCL 5 MG/5ML PO SOLN: 5.0000 mg | Freq: Once | ORAL | Status: DC | PRN

## 2021-04-14 MED ORDER — SODIUM CHLORIDE 0.9 % IV SOLN
INTRAVENOUS | Status: DC | PRN
  Administered 2021-04-14: 300 mL

## 2021-04-14 SURGICAL SUPPLY — 52 items
ADH SKN CLS APL DERMABOND .7 (GAUZE/BANDAGES/DRESSINGS) ×1
APL PRP STRL LF DISP 70% ISPRP (MISCELLANEOUS) ×1
APPLIER CLIP 9.375 MED OPEN (MISCELLANEOUS)
APR CLP MED 9.3 20 MLT OPN (MISCELLANEOUS)
BINDER BREAST LRG (GAUZE/BANDAGES/DRESSINGS) ×1 IMPLANT
BINDER BREAST XLRG (GAUZE/BANDAGES/DRESSINGS) IMPLANT
BLADE SURG 15 STRL LF DISP TIS (BLADE) ×1 IMPLANT
BLADE SURG 15 STRL SS (BLADE) ×2
CANISTER SUC SOCK COL 7IN (MISCELLANEOUS) IMPLANT
CANISTER SUCT 1200ML W/VALVE (MISCELLANEOUS) ×1 IMPLANT
CHLORAPREP W/TINT 26 (MISCELLANEOUS) ×2 IMPLANT
CLIP APPLIE 9.375 MED OPEN (MISCELLANEOUS) IMPLANT
COVER BACK TABLE 60X90IN (DRAPES) ×2 IMPLANT
COVER MAYO STAND STRL (DRAPES) ×2 IMPLANT
COVER PROBE W GEL 5X96 (DRAPES) ×2 IMPLANT
DERMABOND ADVANCED (GAUZE/BANDAGES/DRESSINGS) ×1
DERMABOND ADVANCED .7 DNX12 (GAUZE/BANDAGES/DRESSINGS) ×1 IMPLANT
DRAPE LAPAROTOMY 100X72 PEDS (DRAPES) ×2 IMPLANT
DRAPE UTILITY XL STRL (DRAPES) ×2 IMPLANT
ELECT COATED BLADE 2.86 ST (ELECTRODE) ×2 IMPLANT
ELECT REM PT RETURN 9FT ADLT (ELECTROSURGICAL) ×2
ELECTRODE REM PT RTRN 9FT ADLT (ELECTROSURGICAL) ×1 IMPLANT
GLOVE SRG 8 PF TXTR STRL LF DI (GLOVE) ×1 IMPLANT
GLOVE SURG LTX SZ8 (GLOVE) ×2 IMPLANT
GLOVE SURG POLYISO LF SZ6.5 (GLOVE) ×1 IMPLANT
GLOVE SURG POLYISO LF SZ7 (GLOVE) ×1 IMPLANT
GLOVE SURG UNDER POLY LF SZ6.5 (GLOVE) ×1 IMPLANT
GLOVE SURG UNDER POLY LF SZ7 (GLOVE) ×1 IMPLANT
GLOVE SURG UNDER POLY LF SZ8 (GLOVE) ×2
GOWN STRL REUS W/ TWL LRG LVL3 (GOWN DISPOSABLE) ×2 IMPLANT
GOWN STRL REUS W/ TWL XL LVL3 (GOWN DISPOSABLE) ×1 IMPLANT
GOWN STRL REUS W/TWL LRG LVL3 (GOWN DISPOSABLE) ×4
GOWN STRL REUS W/TWL XL LVL3 (GOWN DISPOSABLE) ×2
HEMOSTAT ARISTA ABSORB 3G PWDR (HEMOSTASIS) IMPLANT
HEMOSTAT SNOW SURGICEL 2X4 (HEMOSTASIS) IMPLANT
KIT MARKER MARGIN INK (KITS) ×2 IMPLANT
NDL HYPO 25X1 1.5 SAFETY (NEEDLE) ×1 IMPLANT
NEEDLE HYPO 25X1 1.5 SAFETY (NEEDLE) ×2 IMPLANT
NS IRRIG 1000ML POUR BTL (IV SOLUTION) ×2 IMPLANT
PACK BASIN DAY SURGERY FS (CUSTOM PROCEDURE TRAY) ×2 IMPLANT
PENCIL SMOKE EVACUATOR (MISCELLANEOUS) ×2 IMPLANT
SLEEVE SCD COMPRESS KNEE MED (STOCKING) ×2 IMPLANT
SPIKE FLUID TRANSFER (MISCELLANEOUS) IMPLANT
SPONGE T-LAP 4X18 ~~LOC~~+RFID (SPONGE) ×2 IMPLANT
SUT MNCRL AB 4-0 PS2 18 (SUTURE) ×2 IMPLANT
SUT SILK 2 0 SH (SUTURE) IMPLANT
SUT VICRYL 3-0 CR8 SH (SUTURE) ×2 IMPLANT
SYR CONTROL 10ML LL (SYRINGE) ×2 IMPLANT
TOWEL GREEN STERILE FF (TOWEL DISPOSABLE) ×2 IMPLANT
TRAY FAXITRON CT DISP (TRAY / TRAY PROCEDURE) ×2 IMPLANT
TUBE CONNECTING 20X1/4 (TUBING) ×1 IMPLANT
YANKAUER SUCT BULB TIP NO VENT (SUCTIONS) ×1 IMPLANT

## 2021-04-14 NOTE — Transfer of Care (Signed)
Immediate Anesthesia Transfer of Care Note ? ?Patient: Lauren Levy ? ?Procedure(s) Performed: RIGHT BREAST LUMPECTOMY WITH RADIOACTIVE SEED LOCALIZATION (Right: Breast) ? ?Patient Location: PACU ? ?Anesthesia Type:General ? ?Level of Consciousness: drowsy, patient cooperative and responds to stimulation ? ?Airway & Oxygen Therapy: Patient Spontanous Breathing and Patient connected to face mask oxygen ? ?Post-op Assessment: Report given to RN and Post -op Vital signs reviewed and stable ? ?Post vital signs: Reviewed and stable ? ?Last Vitals:  ?Vitals Value Taken Time  ?BP    ?Temp    ?Pulse 86 04/14/21 0809  ?Resp    ?SpO2 99 % 04/14/21 0809  ?Vitals shown include unvalidated device data. ? ?Last Pain:  ?Vitals:  ? 04/14/21 0658  ?TempSrc: Oral  ?PainSc: 0-No pain  ?   ? ?Patients Stated Pain Goal: 5 (04/14/21 3893) ? ?Complications: No notable events documented. ?

## 2021-04-14 NOTE — H&P (Signed)
History of Present Illness: ?Lauren Levy is a 51 y.o. female who is seen today as an office consultation at the request of Dr. Jacelyn Grip for evaluation of new breast ?.  ?Patient presents for evaluation of right breast mammographic abnormality. There is density in the right retroareolar position identified a core biopsy site of papilloma. She has no history of nipple discharge, mass or pain in either breast. No strong family history of breast cancer related. ? ? ?Review of Systems: ?A complete review of systems was obtained from the patient. I have reviewed this information and discussed as appropriate with the patient. See HPI as well for other ROS. ? ? ? ?Medical History: ?History reviewed. No pertinent past medical history. ? ?There is no problem list on file for this patient. ? ?Past Surgical History:  ?Procedure Laterality Date  ? carpal tunnel surgery N/A  ? ? ?No Known Allergies ? ?Current Outpatient Medications on File Prior to Visit  ?Medication Sig Dispense Refill  ? clonazePAM (KLONOPIN) 0.5 MG tablet 1 tablet as needed  ? cyanocobalamin (VITAMIN B-12) 1000 MCG tablet 1 tablet  ? cyanocobalamin (VITAMIN B12) 1000 MCG tablet Take 1 tablet by mouth once daily  ? trihexyphenidyL (ARTANE) 2 MG tablet 1 tablet  ? cholecalciferol (VITAMIN D3) 5,000 unit capsule as directed  ? multivit-min-iron-FA-lutein (CENTRUM SILVER WOMEN) 8 mg iron-400 mcg-300 mcg Tab as directed  ? ?No current facility-administered medications on file prior to visit.  ? ?Family History  ?Problem Relation Age of Onset  ? High blood pressure (Hypertension) Mother  ? ? ?Social History  ? ?Tobacco Use  ?Smoking Status Former  ? Types: Cigarettes  ?Smokeless Tobacco Never  ? ? ?Social History  ? ?Socioeconomic History  ? Marital status: Married  ?Tobacco Use  ? Smoking status: Former  ?Types: Cigarettes  ? Smokeless tobacco: Never  ?Vaping Use  ? Vaping Use: Never used  ?Substance and Sexual Activity  ? Alcohol use: Yes  ? Drug use: Never   ? ?Objective:  ? ?Vitals:  ?03/23/21 1359  ?BP: 132/68  ?Pulse: 102  ?Temp: 36.8 ?C (98.3 ?F)  ?SpO2: 99%  ?Weight: 78 kg (172 lb)  ?Height: 170.2 cm ('5\' 7"'$ )  ? ?Body mass index is 26.94 kg/m?. ? ?Physical Exam ?Constitutional:  ?Appearance: Normal appearance.  ?HENT:  ?Head: Normocephalic.  ?Eyes:  ?Pupils: Pupils are equal, round, and reactive to light.  ?Cardiovascular:  ?Rate and Rhythm: Normal rate.  ?Pulmonary:  ?Effort: Pulmonary effort is normal.  ?Breath sounds: No stridor.  ?Chest:  ?Breasts: ?Right: Normal.  ?Left: Normal.  ?Musculoskeletal:  ?Cervical back: Normal range of motion and neck supple.  ?Skin: ?General: Skin is warm.  ?Neurological:  ?General: No focal deficit present.  ?Mental Status: She is alert and oriented to person, place, and time.  ?Psychiatric:  ?Mood and Affect: Mood normal.  ?Behavior: Behavior normal.  ? ? ? 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 ?  ?EXAM: ?DIGITAL DIAGNOSTIC UNILATERAL LEFT MAMMOGRAM WITH TOMOSYNTHESIS AND ?CAD ?  ?TECHNIQUE: ?Left digital diagnostic mammography and breast tomosynthesis was ?performed. The images were evaluated with computer-aided detection. ?  ?COMPARISON: Previous exams. ?  ?ACR Breast Density Category c: The breast tissue is heterogeneously ?dense, which may obscure small masses. ?  ?  FINDINGS: ?No suspicious masses or calcifications seen in the left breast. ?There is no mammographic evidence of malignancy. ?  ?IMPRESSION: ?No mammographic evidence of malignancy. ?  ?RECOMMENDATION: ?Recommend annual screening mammography of the bilateral breasts ?which will be due June 2023. ?  ?I have discussed the findings and recommendations with  the patient. ?If applicable, a reminder letter will be sent to the patient ?regarding the next appointment. ?  ?BI-RADS CATEGORY 1: Negative. ?  ?  ?Electronically Signed ?By: Everlean Alstrom M.D. ?On: 03/18/2021 12:57 ?Recall from 2D screening mammography, possible mass ?involving the in inner breast at middle depth. Patient is currently ?asymptomatic. ?  ?EXAM: ?DIGITAL DIAGNOSTIC UNILATERAL RIGHT MAMMOGRAM WITH TOMOSYNTHESIS AND ?CAD; ULTRASOUND RIGHT BREAST LIMITED ?  ?TECHNIQUE: ?Right digital diagnostic mammography and breast tomosynthesis was ?performed. The images were evaluated with computer-aided detection.; ?Targeted ultrasound examination of the right breast was performed ?  ?COMPARISON: Previous exam(s). ?  ?ACR Breast Density Category c: The breast tissue is heterogeneously ?dense, which may obscure small masses. ?  ?FINDINGS: ?Spot-compression CC and MLO views of the area of concern were ?obtained. ?  ?These confirm a circumscribed isodense mass measuring approximately ?7 mm in the inner breast at middle depth. There is no associated ?architectural distortion or suspicious calcifications. ?  ?Targeted ultrasound is performed, demonstrating a benign simple cyst ?at the 3 o'clock subareolar location at anterior to middle depth, ?measuring approximately 7 x 2 x 6 mm, demonstrating posterior ?acoustic enhancement and no internal power Doppler flow, ?corresponding to the screening mammographic finding. ?  ?Incidental note is made of hypoechoic material within a normal ?caliber duct directly behind the nipple that does not demonstrate ?power Doppler flow, and is completely surrounded by fluid, therefore ?most likely clumped debris within the duct. ?  ?IMPRESSION: ?1. Benign 7 mm simple cyst in the inner RIGHT breast at the 3 ?o'clock subareolar location which accounts for the screening ?mammographic finding. ?2. Likely clumped debris within a normal caliber duct directly ?behind the RIGHT nipple. ?   ?RECOMMENDATION: ?RIGHT breast ultrasound in 6 months to re-evaluate the likely benign ?clump debris within the duct directly behind the nipple. ?  ?I discussed with the patient the fact that I believe this is clumped ?debris rather than a papilloma, given the absence of internal power ?Doppler flow. However, I counseled her that if she were to develop a ?discharge from the RIGHT nipple, she should contact the [facility] and schedule an ultrasound-guided core ?needle biopsy. ?  ?I have discussed the findings and recommendations with the patient. ?If applicable, a reminder letter will be sent to the patient ?regarding the next appointment. ?  ?BI-RADS CATEGORY 3: Probably benign. ?  ?  ?Electronically Signed ?By: Evangeline Dakin M.D. ?On: 07/29/2020 15:00 ? ?Assessment and Plan:  ? ?Diagnoses and all orders for this visit: ? ?Papilloma of right breast ? ? ? ?Discussed the pros and cons of excision. Discussed that many of these can be followed and she has criteria that this could be followed without surgery. He does have concerns though of cancer and that risk is small but nonzero. She is opted for a right breast seed localized lumpectomy.The procedure has been discussed with the patient. Alternatives to surgery have been discussed with the patient. Risks of surgery include bleeding, Infection, Seroma formation, death, and the need for further surgery. The patient understands and wishes to proceed. ?

## 2021-04-14 NOTE — Interval H&P Note (Signed)
History and Physical Interval Note: ? ?04/14/2021 ?7:21 AM ? ?[name]  has presented today for surgery, with the diagnosis of RIGHT BREAST PAPILLOMA.  The various methods of treatment have been discussed with the patient and family. After consideration of risks, benefits and other options for treatment, the patient has consented to  Procedure(s): ?RIGHT BREAST LUMPECTOMY WITH RADIOACTIVE SEED LOCALIZATION (Right) as a surgical intervention.  The patient's history has been reviewed, patient examined, no change in status, stable for surgery.  I have reviewed the patient's chart and labs.  Questions were answered to the patient's satisfaction.   ? ? ?Lauren Levy A Slayter Moorhouse ? ? ?

## 2021-04-14 NOTE — Op Note (Signed)
eoperative diagnosis: right breast papilloma ? ?Postoperative diagnosis: Same  ? ?Procedure: right breast seed localized lumpectomy ? ?Surgeon: Erroll Luna M.D. ? ?Anesthesia: Gen. With 0.25% Sensorcaine local ? ?EBL: 20 cc ? ?IVF: per OR record  ? ?Drains: none  ? ?Specimen: right  breast tissue with clip and radioactive seed in the specimen. Verified with neoprobe and radiographic image showing both seed and clip in specimen ? ?Indications for procedure: The patient presents for right  breast  lumpectomy after core biopsy showed papilloma. Discussed the rationale for considering excision. Small risk of malignancy associated with papilloma lesion after core biopsy. Discussed observation. Discussed seed  localization. Patient desired excision of right  breast papilloma.The procedure has been discussed with the patient. Alternatives to surgery have been discussed with the patient.  Risks of surgery include bleeding,  Infection,  Seroma formation, death,  and the need for further surgery.   The patient understands and wishes to proceed. ? ? ?Description of procedure: Patient underwent seed placement as an outpatient. Patient presents today for right breast seed localized lumpectomy. Patient seen in the  holding area. Questions are answered films used to verify seed location. Patient taken back to the operating room and placed upon the OR table. After induction of general anesthesia, right breast was  prepped and draped in a sterile fashion. Timeout was done to verify proper procedure. Neoprobe used and hot spot identified in the subareolar right  breast. This was marked with pen. Curvilinear incision made along the inferior border of the NAC. Dissection used with the help of a neoprobe around the tissue where the seed and clip were located. Tissue removed in its entirety with gross  NEGATIVE margins.. Neoprobe used and seed within specimen. Radiographs taken which show clip and seed in the specimen. Hemostasis  achieved with cautery and local anesthesia injected into the  cavity. Wound irrigated and closed with 3-0 Vicryl and 4-0 Monocryl. Dermabond applied. All final counts found to be correct. Specimen transported to pathology. Patient awoke extubated taken to recovery in satisfactory condition.  ?

## 2021-04-14 NOTE — Discharge Instructions (Addendum)
No Tylenol or Ibuprofen/Motrin until 1:04 pm ? ? ?Post Anesthesia Home Care Instructions ? ?Activity: ?Get plenty of rest for the remainder of the day. A responsible individual must stay with you for 24 hours following the procedure.  ?For the next 24 hours, DO NOT: ?-Drive a car ?-Paediatric nurse ?-Drink alcoholic beverages ?-Take any medication unless instructed by your physician ?-Make any legal decisions or sign important papers. ? ?Meals: ?Start with liquid foods such as gelatin or soup. Progress to regular foods as tolerated. Avoid greasy, spicy, heavy foods. If nausea and/or vomiting occur, drink only clear liquids until the nausea and/or vomiting subsides. Call your physician if vomiting continues. ? ?Special Instructions/Symptoms: ?Your throat may feel dry or sore from the anesthesia or the breathing tube placed in your throat during surgery. If this causes discomfort, gargle with warm salt water. The discomfort should disappear within 24 hours. ? ?If you had a scopolamine patch placed behind your ear for the management of post- operative nausea and/or vomiting: ? ?1. The medication in the patch is effective for 72 hours, after which it should be removed.  Wrap patch in a tissue and discard in the trash. Wash hands thoroughly with soap and water. ?2. You may remove the patch earlier than 72 hours if you experience unpleasant side effects which may include dry mouth, dizziness or visual disturbances. ?3. Avoid touching the patch. Wash your hands with soap and water after contact with the patch. ?    ? ?[facility] ?Office Phone Number [phone] ? ?BREAST BIOPSY/ PARTIAL MASTECTOMY: POST OP INSTRUCTIONS ? ?Always review your discharge instruction sheet given to you by the facility where your surgery was performed. ? ?IF YOU HAVE DISABILITY OR FAMILY LEAVE FORMS, YOU MUST BRING THEM TO THE OFFICE FOR PROCESSING.  DO NOT GIVE THEM TO YOUR DOCTOR. ? ?A prescription for pain medication may be  given to you upon discharge.  Take your pain medication as prescribed, if needed.  If narcotic pain medicine is not needed, then you may take acetaminophen (Tylenol) or ibuprofen (Advil) as needed. ?Take your usually prescribed medications unless otherwise directed ?If you need a refill on your pain medication, please contact your pharmacy.  They will contact our office to request authorization.  Prescriptions will not be filled after 5pm or on week-ends. ?You should eat very light the first 24 hours after surgery, such as soup, crackers, pudding, etc.  Resume your normal diet the day after surgery. ?Most patients will experience some swelling and bruising in the breast.  Ice packs and a good support bra will help.  Swelling and bruising can take several days to resolve.  ?It is common to experience some constipation if taking pain medication after surgery.  Increasing fluid intake and taking a stool softener will usually help or prevent this problem from occurring.  A mild laxative (Milk of Magnesia or Miralax) should be taken according to package directions if there are no bowel movements after 48 hours. ?Unless discharge instructions indicate otherwise, you may remove your bandages 24-48 hours after surgery, and you may shower at that time.  You may have steri-strips (small skin tapes) in place directly over the incision.  These strips should be left on the skin for 7-10 days.  If your surgeon used skin glue on the incision, you may shower in 24 hours.  The glue will flake off over the next 2-3 weeks.  Any sutures or staples will be removed at the office during your follow-up  visit. ?ACTIVITIES:  You may resume regular daily activities (gradually increasing) beginning the next day.  Wearing a good support bra or sports bra minimizes pain and swelling.  You may have sexual intercourse when it is comfortable. ?You may drive when you no longer are taking prescription pain medication, you can comfortably wear a  seatbelt, and you can safely maneuver your car and apply brakes. ?RETURN TO WORK:  ______________________________________________________________________________________ ?You should see your doctor in the office for a follow-up appointment approximately two weeks after your surgery.  Your doctor?s nurse will typically make your follow-up appointment when she calls you with your pathology report.  Expect your pathology report 2-3 business days after your surgery.  You may call to check if you do not hear from [place] after three days. ?OTHER INSTRUCTIONS: _______________________________________________________________________________________________ _____________________________________________________________________________________________________________________________________ ?_____________________________________________________________________________________________________________________________________ ?_____________________________________________________________________________________________________________________________________ ? ?WHEN TO CALL YOUR DOCTOR: ?Fever over 101.0 ?Nausea and/or vomiting. ?Extreme swelling or bruising. ?Continued bleeding from incision. ?Increased pain, redness, or drainage from the incision. ? ?The clinic staff is available to answer your questions during regular business hours.  Please don?t hesitate to call and ask to speak to one of the nurses for clinical concerns.  If you have a medical emergency, go to the nearest emergency room or call 911.  A surgeon from [facility] is always on call at the hospital. ? ?For further questions, please visit [email or web address] ? ? ?

## 2021-04-14 NOTE — Anesthesia Postprocedure Evaluation (Signed)
Anesthesia Post Note ? ?Patient: Lauren Levy ? ?Procedure(s) Performed: RIGHT BREAST LUMPECTOMY WITH RADIOACTIVE SEED LOCALIZATION (Right: Breast) ? ?  ? ?Patient location during evaluation: PACU ?Anesthesia Type: General ?Level of consciousness: awake and alert ?Pain management: pain level controlled ?Vital Signs Assessment: post-procedure vital signs reviewed and stable ?Respiratory status: spontaneous breathing, nonlabored ventilation and respiratory function stable ?Cardiovascular status: stable and blood pressure returned to baseline ?Anesthetic complications: no ? ? ?No notable events documented. ? ?Last Vitals:  ?Vitals:  ? 04/14/21 0830 04/14/21 0844  ?BP: 101/77 120/87  ?Pulse: 68 74  ?Resp: 16 14  ?Temp:  36.6 ?C  ?SpO2: 96% 98%  ?  ?Last Pain:  ?Vitals:  ? 04/14/21 0842  ?TempSrc:   ?PainSc: 2   ? ? ?  ?  ?  ?  ?  ?  ? ?Audry Pili ? ? ? ? ?

## 2021-04-14 NOTE — Anesthesia Procedure Notes (Signed)
Procedure Name: LMA Insertion ?Date/Time: 04/14/2021 7:33 AM ?Performed by: Glory Buff, CRNA ?Pre-anesthesia Checklist: Patient identified, Emergency Drugs available, Suction available and Patient being monitored ?Patient Re-evaluated:Patient Re-evaluated prior to induction ?Oxygen Delivery Method: Circle system utilized ?Preoxygenation: Pre-oxygenation with 100% oxygen ?Induction Type: IV induction ?LMA: LMA inserted ?LMA Size: 4.0 ?Number of attempts: 1 ?Placement Confirmation: positive ETCO2 ?Tube secured with: Tape ?Dental Injury: Teeth and Oropharynx as per pre-operative assessment  ? ? ? ? ?

## 2021-04-15 ENCOUNTER — Encounter (HOSPITAL_BASED_OUTPATIENT_CLINIC_OR_DEPARTMENT_OTHER): Payer: Self-pay | Admitting: Surgery

## 2021-04-15 LAB — SURGICAL PATHOLOGY

## 2021-04-20 ENCOUNTER — Other Ambulatory Visit: Payer: Self-pay | Admitting: Neurology

## 2021-04-20 ENCOUNTER — Encounter: Payer: Self-pay | Admitting: Surgery

## 2021-04-20 NOTE — Telephone Encounter (Signed)
Last OV was on 03/10/21.  ?Next OV is not scheduled.  ?Last RX was written on 02/23/21 for 60 tabs.  ? ?[place] [facility] has been reviewed.  ?

## 2021-05-13 ENCOUNTER — Encounter (HOSPITAL_COMMUNITY): Payer: Self-pay

## 2021-05-15 NOTE — Telephone Encounter (Signed)
Pt called wanting to know when she can get scheduled for her Botox. She states she has called twice to get scheduled but nobody called her back. Please advise. ?

## 2021-05-20 MED ORDER — ONABOTULINUMTOXINA 100 UNITS IJ SOLR: 100.0000 [IU] | INTRAMUSCULAR | 3 refills | Status: AC

## 2021-05-20 NOTE — Telephone Encounter (Signed)
Pt is calling back as a resort of not having her messages responded to, please call pt re: her Botox ?

## 2021-05-20 NOTE — Addendum Note (Signed)
Addended by: Wyvonnia Lora on: 05/20/2021 04:04 PM ? ? Modules accepted: Orders ? ?

## 2021-05-20 NOTE — Addendum Note (Signed)
Addended by: Wyvonnia Lora on: 05/20/2021 03:50 PM ? ? Modules accepted: Orders ? ?

## 2021-05-20 NOTE — Telephone Encounter (Signed)
Received approval from [place],  [place] # [id number] (03/23/2021-03/22/2022). Please send Botox RX to [facility]. ?

## 2021-06-03 ENCOUNTER — Encounter: Payer: Self-pay | Admitting: Neurology

## 2021-06-03 ENCOUNTER — Ambulatory Visit: Payer: Managed Care, Other (non HMO) | Admitting: Neurology

## 2021-06-03 VITALS — BP 117/74 | HR 84 | Ht 66.0 in | Wt 171.0 lb

## 2021-06-03 DIAGNOSIS — R269 Unspecified abnormalities of gait and mobility: Secondary | ICD-10-CM | POA: Diagnosis not present

## 2021-06-03 DIAGNOSIS — G249 Dystonia, unspecified: Secondary | ICD-10-CM | POA: Insufficient documentation

## 2021-06-03 DIAGNOSIS — G248 Other dystonia: Secondary | ICD-10-CM

## 2021-06-03 NOTE — Patient Instructions (Signed)
Voltaren gel 

## 2021-06-03 NOTE — Progress Notes (Signed)
Botox 100 units x 1 vial ?[id number] ?[id number] ?Exp-04-2023 ?[facility] ?

## 2021-06-03 NOTE — Progress Notes (Signed)
? ?Chief Complaint  ?Patient presents with  ? Procedure  ?  Botox 100 u  ? ? ? ? ?ASSESSMENT AND PLAN ? ?Lauren Levy is a 51 y.o. female  ?Right lower extremity task specific dystonia, ? Right ankle plantarflexion, upon ambulation, also have facial dystonia ? Patient is very much bothered by her symptoms, desire further evaluation by academic center, will refer to management specialist at [facility] Drs. Siddiqui ? ? This is her first EMG guided Botox a injection, used 100 units, ? ?Botox A, 100 units, dissolved into 2 cc of normal saline, ?Under EMG guidance ? ?Right flexor digitorum longus 50 units ?Right tibialis posterior 50 units ? ? ?DIAGNOSTIC DATA (LABS, IMAGING, TESTING) ?- I reviewed patient records, labs, notes, testing and imaging myself where available. ? ? ?MEDICAL HISTORY: ? ?Lauren Levy is a 51 year old female, accompanied by her husband of 9 years, referred by Dr. Felecia Shelling for evaluation of EMG guided botulism toxin injection for right lower extremity dystonia, her primary care physician is Dr. Jacelyn Grip, [place]  ?  ?She is a native of [place], had education and [name], has immigrated to [place] more than a decade ago. ? ?She was previously healthy, was very active, worked as a Cabin crew since 2018, ? ?Around 2021, she began to notice right knee discomfort, deep achy pain, was seen by different physician, no apparent right knee pathology found, reported normal MRI of right knee, EMG nerve conduction study of right lower extremity, ? 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 ?Personally reviewed MRIs ?MRI of the brain in August 2022 no acute abnormality ?MRI of cervical, thoracic spine showed mild degenerative  disease, no evidence of canal foraminal narrowing ?MRI of lumbar mild degenerative changes, disc and facet degeneration at L4-5, no significant canal foraminal narrowing ? ?She was treated with Artane '2mg'$  tid and clonazepam 0.5 mg twice daily since Jan 2023, complains of excessive sleepiness, dry mouth, fatigue, there was no significant improvement ? ?She already noticed right leg dystonic movement, ankle plantarflexion, toe flexion, moving forward, she can walk nearly normal backwards, sideways. ? ?During today's interview, she was noted to have frequent grimace, eye blinking movement, patient reported that has been gradually worse over the past 3 to 4 years  ? ?She has 3 children, one of her oldest daughter at age 44 suffered motor tic for few years, tongue protruding, neck jerking movement, now has improved ? ?She denies a family history of movement disorder otherwise ?  ?PHYSICAL EXAM: ? ? ?PHYSICAL EXAMNIATION: ? ?Gen: NAD, conversant, well nourised, well groomed                     ?Cardiovascular: Regular rate rhythm, no peripheral edema, warm, nontender. ?Eyes: Conjunctivae clear without exudates or hemorrhage ?Neck: Supple, no carotid bruits. ?Pulmonary: Clear to auscultation bilaterally  ? ?NEUROLOGICAL EXAM: ? ?MENTAL STATUS: ?Speech/cognition: Awake, alert, oriented to history taking and casual conversation, tearful, frequent eye blinking, facial grimace, ?CRANIAL NERVES: ?CN II: Visual fields are full to confrontation. Pupils are round equal and briskly reactive to light. ?CN III, IV, VI: extraocular movement are normal. No ptosis. ?CN V: Facial sensation is intact to light touch ?CN VII:  Face is symmetric with normal eye closure  ?CN VIII: Hearing is normal to causal conversation. ?CN IX, X: Phonation is normal. ?CN XI: Head turning and shoulder shrug are intact ? ?MOTOR: ?There is no pronator drift of out-stretched arms. Muscle bulk and tone are normal. Muscle strength is  normal. ? ?REFLEXES: ?Reflexes are 2+ and symmetric at the biceps, triceps, knees, and ankles. Plantar responses are flexor. ? ?SENSORY: ?Intact to light touch,  and vibratory sensation  ? ?COORDINATION: ?There is no trunk or limb dysmetria noted. ? ?GAIT/STANCE: She can get up from seated position arm crossed, noticeable right ankle plantarflexion, crossover to left side when moving forward, nearly normal gesture moving backwards, and sideways ? ?REVIEW OF SYSTEMS:  ?Full 14 system review of systems performed and notable only for as above ?All other review of systems were negative. ? ? ?ALLERGIES: ?No Known Allergies ? ?HOME MEDICATIONS: ?Current Outpatient Medications  ?Medication Sig Dispense Refill  ? botulinum toxin Type A (BOTOX) 100 units SOLR injection Inject 100 Units into the muscle every 3 (three) months. To be injected by MD in the office 1 each 3  ? Cholecalciferol (VITAMIN D3) 125 MCG (5000 UT) CAPS Take 1 tablet by mouth daily.    ? clonazePAM (KLONOPIN) 0.5 MG tablet TAKE ONE TABLET BY MOUTH TWICE A DAY AS NEEDED FOR ANXIETY 60 tablet 3  ? ibuprofen (ADVIL) 800 MG tablet Take 1 tablet (800 mg total) by mouth every 8 (eight) hours as needed. 30 tablet 0  ? Krill Oil (OMEGA-3) 500 MG CAPS Take 1 tablet by mouth 2 (two) times daily. Omega Woman w evening Primrose oil ?And '76mg'$  GLA    ? Magnesium 400 MG CAPS Take 1 capsule by mouth 2 (two) times daily.    ? Multiple Vitamins-Minerals (CENTRUM SILVER 50+WOMEN) TABS Take 1 tablet by mouth daily.    ? oxyCODONE (OXY IR/ROXICODONE) 5 MG immediate release tablet Take 1 tablet (5 mg total) by mouth every 6 (six) hours as needed for severe pain. 15 tablet 0  ? trihexyphenidyl (ARTANE) 2 MG tablet Restart at 2 mg TID to ensure tolerates. If tolerates may increase to 1-2 tablet TID. 180 tablet 5  ? vitamin B-12 (CYANOCOBALAMIN) 1000 MCG tablet Take 1 tablet by mouth daily.    ? ?No current facility-administered medications for this visit.  ? ? ?PAST MEDICAL  HISTORY: ?Past Medical History:  ?Diagnosis Date  ? Ovarian cyst   ? ? ?PAST SURGICAL HISTORY: ?Past Surgical History:  ?Procedure Laterality Date  ? BREAST BIOPSY Right 02/16/2021  ? papilloma  ? BREAST LUMPECTOMY WITH RADIOACTIVE SEED LOCALIZATION Right 04/14/2021  ? Procedure: RIGHT BREAST LUMPECTOMY WITH RADIOACTIVE SEED LOCALIZATION;  Surgeon: Erroll Luna, MD;  Location: [place];  Service: General;  Laterality: Right;  ? MINOR CARPAL TUNNEL Right   ? ? ?FAMILY HISTORY: ?Family History  ?Problem Relation Age of Onset  ? High blood pressure Mother   ? Osteoporosis Mother   ? Arthritis Mother   ? Arthritis Father   ? Osteoporosis Father   ? Breast cancer Paternal Aunt   ? ? ?SOCIAL HISTORY: ?Social History  ? ?Socioeconomic History  ? Marital status: Married  ?  Spouse name: Claiborne Billings  ? Number of children: 3  ? Years of education: Not on file  ? Highest education level: Bachelor's degree (e.g., BA, AB, BS)  ?Occupational History  ? Not on file  ?Tobacco Use  ? Smoking status: Former  ?  Types: Cigarettes  ?  Quit date: 2005  ?  Years since quitting: 18.3  ? Smokeless tobacco: Never  ?Vaping Use  ? Vaping Use: Never used  ?Substance and Sexual Activity  ? Alcohol use: Yes  ?  Comment: occ  ? Drug use: No  ? Sexual activity: Not on file  ?Other Topics Concern  ? Not on file  ?Social History Narrative  ? Live with husband and children  ? Left handed  ? Caffeine: 2 cups of tea a day, 1 soda a day  ? ?Social Determinants of Health  ? ?Financial Resource Strain: Not on file  ?Food Insecurity: Not on file  ?Transportation Needs: Not on file  ?Physical Activity: Not on file  ?Stress: Not on file  ?Social Connections: Not on file  ?Intimate Partner Violence: Not on file  ? ? ? ? ?Marcial Pacas, M.D. Ph.D. ? ?[facility] ?[address], [address] ?[place], [place] [address] ?Ph: [phone]) [phone] ?Fax: [phone] ? ?CC:  Vernie Shanks, MD ?[address],  [place] [address]  Vernie Shanks, MD   ?

## 2021-06-04 ENCOUNTER — Telehealth: Payer: Self-pay | Admitting: Neurology

## 2021-06-04 NOTE — Telephone Encounter (Signed)
Referral for Neurology sent to Dr. Deboraha Sprang at [facility] [phone]. ?

## 2021-06-04 NOTE — Telephone Encounter (Signed)
Received (1) 100 unit vial of Botox from [facility]. ?

## 2021-06-16 ENCOUNTER — Telehealth: Payer: Self-pay | Admitting: Neurology

## 2021-06-16 NOTE — Telephone Encounter (Signed)
Called pt back. Feels she is having SE from Artane and clonazepam. She discussed with PCP. She struggles with remembering someone's name/home address. This has been going on for the last couple of weeks. ? ?Confirmed she is taking Artane '2mg'$ ,  2 po TID (increased from 1 po TID around Nov/Dec 2023, she cannot remember exactly when she increased dose). Takes clonazepam 0.'5mg'$  twice daily. Now trying to take once daily in the morning to see if this helps. Dr. Jacelyn Grip wanted her to reach out to [place] to make a plan of tapering off of these medications if Dr. Felecia Shelling agreed with this plan.  ? ?She received botox 06/03/21 with Dr. Krista Blue. Was told it can take 7-10 days to take affect. Waiting to see if this is beneficial. She would like to continue with botox. Feels some relief from toes curling inward. Heel still raising up and foot still wants to turn inward. She would want to wait another week to see how things go.  ? ?Aware Dr. Felecia Shelling out of office until Tuesday next week. Would like to wait for his return to review and offer recommendation. Aware we will f/u with her next week.  ?

## 2021-06-16 NOTE — Telephone Encounter (Signed)
Pt said, PCP, Dr.Wong recommended to taper off of trihexyphenidyl (ARTANE) 2 MG tablet and clonazePAM (KLONOPIN) 0.5 MG tablet . Because of side effects, confusion, forgetfulness, have to think twice to remember common things like SSN. Would like a call from the nurse to discuss. ?

## 2021-08-04 ENCOUNTER — Encounter (HOSPITAL_COMMUNITY): Payer: Self-pay | Admitting: Emergency Medicine

## 2021-08-04 ENCOUNTER — Emergency Department (HOSPITAL_COMMUNITY)
Admission: EM | Admit: 2021-08-04 | Discharge: 2021-08-04 | Disposition: A | Discharge status: Home / Self Care | Payer: Commercial Managed Care - HMO | Attending: Emergency Medicine | Admitting: Emergency Medicine

## 2021-08-04 ENCOUNTER — Other Ambulatory Visit: Payer: Self-pay

## 2021-08-04 ENCOUNTER — Emergency Department (HOSPITAL_COMMUNITY): Payer: Commercial Managed Care - HMO

## 2021-08-04 DIAGNOSIS — T148XXA Other injury of unspecified body region, initial encounter: Secondary | ICD-10-CM

## 2021-08-04 DIAGNOSIS — X58XXXA Exposure to other specified factors, initial encounter: Secondary | ICD-10-CM | POA: Insufficient documentation

## 2021-08-04 DIAGNOSIS — S46912A Strain of unspecified muscle, fascia and tendon at shoulder and upper arm level, left arm, initial encounter: Secondary | ICD-10-CM | POA: Diagnosis not present

## 2021-08-04 DIAGNOSIS — F419 Anxiety disorder, unspecified: Secondary | ICD-10-CM | POA: Diagnosis not present

## 2021-08-04 DIAGNOSIS — M25512 Pain in left shoulder: Secondary | ICD-10-CM | POA: Diagnosis present

## 2021-08-04 LAB — BASIC METABOLIC PANEL
Anion gap: 9 (ref 5–15)
BUN: 12 mg/dL (ref 6–20)
CO2: 23 mmol/L (ref 22–32)
Calcium: 9.8 mg/dL (ref 8.9–10.3)
Chloride: 108 mmol/L (ref 98–111)
Creatinine, Ser: 0.62 mg/dL (ref 0.44–1.00)
GFR, Estimated: >60 mL/min (ref >60)
Glucose, Bld: 101 mg/dL — ABNORMAL HIGH (ref 70–99)
Potassium: 3.8 mmol/L (ref 3.5–5.1)
Sodium: 140 mmol/L (ref 135–145)

## 2021-08-04 LAB — CBC
HCT: 35.1 % — ABNORMAL LOW (ref 36.0–46.0)
Hemoglobin: 12.3 g/dL (ref 12.0–15.0)
MCH: 31.1 pg (ref 26.0–34.0)
MCHC: 35 g/dL (ref 30.0–36.0)
MCV: 88.6 fL (ref 80.0–100.0)
Platelets: 364 10*3/uL (ref 150–400)
RBC: 3.96 MIL/uL (ref 3.87–5.11)
RDW: 12.4 % (ref 11.5–15.5)
WBC: 3.5 10*3/uL — ABNORMAL LOW (ref 4.0–10.5)
nRBC: 0 % (ref 0.0–0.2)

## 2021-08-04 LAB — I-STAT BETA HCG BLOOD, ED (MC, WL, AP ONLY): I-stat hCG, quantitative: <5 m[IU]/mL (ref <5)

## 2021-08-04 LAB — TROPONIN I (HIGH SENSITIVITY): Troponin I (High Sensitivity): <2 ng/L (ref <18)

## 2021-08-04 MED ORDER — METHOCARBAMOL 500 MG PO TABS
500.0000 mg | ORAL_TABLET | Freq: Two times a day (BID) | ORAL | 0 refills | Status: DC
Start: 2021-08-04 — End: 2021-10-07

## 2021-08-04 MED ORDER — METHOCARBAMOL 500 MG PO TABS: 500.0000 mg | ORAL_TABLET | Freq: Two times a day (BID) | ORAL | 0 refills | Status: DC

## 2021-08-04 MED ORDER — NAPROXEN 375 MG PO TABS: 375.0000 mg | ORAL_TABLET | Freq: Two times a day (BID) | ORAL | 0 refills | Status: DC

## 2021-08-04 MED ORDER — NAPROXEN 375 MG PO TABS
375.0000 mg | ORAL_TABLET | Freq: Two times a day (BID) | ORAL | 0 refills | Status: DC
Start: 2021-08-04 — End: 2021-08-04

## 2021-08-04 NOTE — ED Provider Notes (Signed)
[facility] EMERGENCY DEPARTMENT Provider Note   CSN: [id number] Arrival date & time: 08/04/21  1024     History  Chief Complaint  Patient presents with   Arm Pain    Lauren Levy is a 51 y.o. female.  51 year old female presents today for evaluation of left arm pain.  She states it began last night with left shoulder pain that is now progressed to involve the entire left arm since this morning.  She states she related this to her husband who brought up the concern of heart attack because his dad had similar symptoms prior to his MI.  She states since then she has been more anxious and came in for evaluation to ensure she was not having a heart attack.  Denies any recent injury.  She denies history of hypertension, diabetes, or other cardiac disease.  She denies chest pain however states she feels different in her chest.  She is unable to qualify what she means by this.  The history is provided by the patient. No language interpreter was used.       Home Medications Prior to Admission medications   Medication Sig Start Date End Date Taking? Authorizing Provider  botulinum toxin Type A (BOTOX) 100 units SOLR injection Inject 100 Units into the muscle every 3 (three) months. To be injected by MD in the office 05/20/21   Sater, Nanine Means, MD  Cholecalciferol (VITAMIN D3) 125 MCG (5000 UT) CAPS Take 1 tablet by mouth daily.    [provider]  clonazePAM (KLONOPIN) 0.5 MG tablet TAKE ONE TABLET BY MOUTH TWICE A DAY AS NEEDED FOR ANXIETY 04/20/21   Sater, Nanine Means, MD  Javier Docker Oil (OMEGA-3) 500 MG CAPS Take 1 tablet by mouth 2 (two) times daily. Omega Woman w evening Primrose oil And '76mg'$  GLA    [provider]  Multiple Vitamins-Minerals (CENTRUM SILVER 50+WOMEN) TABS Take 1 tablet by mouth daily.    [provider]  trihexyphenidyl (ARTANE) 2 MG tablet Restart at 2 mg TID to ensure tolerates. If tolerates may increase to 1-2 tablet TID.  01/07/21   Sater, Nanine Means, MD  vitamin B-12 (CYANOCOBALAMIN) 1000 MCG tablet Take 1 tablet by mouth daily. 02/25/21   [provider]      Allergies    Patient has no known allergies.    Review of Systems   Review of Systems  Constitutional:  Negative for chills and fever.  Respiratory:  Negative for cough and shortness of breath.   Cardiovascular:  Negative for chest pain.  Musculoskeletal:  Positive for arthralgias. Negative for neck pain and neck stiffness.  Neurological:  Negative for weakness and numbness.  All other systems reviewed and are negative.   Physical Exam Updated Vital Signs BP 134/90 (BP Location: Right Arm)   Pulse 97   Temp 98 F (36.7 C) (Oral)   Resp 20   Ht '5\' 6"'$  (1.676 m)   Wt 77.6 kg   LMP 03/10/2016   SpO2 100%   BMI 27.60 kg/m  Physical Exam Vitals and nursing note reviewed.  Constitutional:      General: She is not in acute distress.    Appearance: Normal appearance. She is not ill-appearing.  HENT:     Head: Normocephalic and atraumatic.     Nose: Nose normal.  Eyes:     General: No scleral icterus.    Extraocular Movements: Extraocular movements intact.     Conjunctiva/sclera: Conjunctivae normal.  Pupils: Pupils are equal, round, and reactive to light.  Cardiovascular:     Rate and Rhythm: Normal rate and regular rhythm.     Pulses: Normal pulses.  Pulmonary:     Effort: Pulmonary effort is normal. No respiratory distress.     Breath sounds: Normal breath sounds. No wheezing or rales.  Abdominal:     General: There is no distension.     Tenderness: There is no abdominal tenderness.  Musculoskeletal:        General: Normal range of motion.     Cervical back: Normal range of motion.     Comments: Tenderness to palpation present over the left shoulder.  Range of motion reproduces pain in the proximal left upper extremity.  Strength is intact and symmetrical bilaterally in upper and lower extremities.    Skin:     General: Skin is warm and dry.  Neurological:     General: No focal deficit present.     Mental Status: She is alert. Mental status is at baseline.     Comments: Cranial nerves III through XII intact.  Without facial droop, dysarthria.  Tongue is midline.  Full range of motion of bilateral upper and lower extremities.  Able to ambulate without difficulty.  Sensation intact bilaterally and is symmetrical in upper and lower extremities.     ED Results / Procedures / Treatments   Labs (all labs ordered are listed, but only abnormal results are displayed) Labs Reviewed  CBC - Abnormal; Notable for the following components:      Result Value   WBC 3.5 (*)    HCT 35.1 (*)    All other components within normal limits  BASIC METABOLIC PANEL  I-STAT BETA HCG BLOOD, ED ([facility], [facility], [facility] ONLY)  TROPONIN I (HIGH SENSITIVITY)    EKG EKG Interpretation  Date/Time:  Tuesday August 04 2021 10:33:38 EDT Ventricular Rate:  106 PR Interval:  152 QRS Duration: 74 QT Interval:  350 QTC Calculation: 464 R Axis:   75 Text Interpretation: Sinus tachycardia Nonspecific T wave abnormality Confirmed by Lajean Saver [phone]) on 08/04/2021 11:17:05 AM  Radiology DG Chest 2 View  Result Date: 08/04/2021 CLINICAL DATA:  Left arm pain EXAM: CHEST - 2 VIEW COMPARISON:  None Available. FINDINGS: The heart size and mediastinal contours are within normal limits. Both lungs are clear. The visualized skeletal structures are unremarkable. IMPRESSION: No active cardiopulmonary disease. Electronically Signed   By: Davina Poke D.O.   On: 08/04/2021 11:08    Procedures Procedures    Medications Ordered in ED Medications - No data to display  ED Course/ Medical Decision Making/ A&P                           Medical Decision Making Amount and/or Complexity of Data Reviewed Labs: ordered. Radiology: ordered.   Medical Decision Making / ED Course   This patient presents to the ED for concern of left arm pain,  this involves an extensive number of treatment options, and is a complaint that carries with it a high risk of complications and morbidity.  The differential diagnosis includes ACS, muscle strain, cervical radiculopathy  MDM: 51 year old female presents today for evaluation of left arm pain.  It began last night with left shoulder pain and is now progressed and is shooting down the left arm.  She is without fever, chills.  She is concerned about MI.  Will evaluate with ACS work-up.  Will  obtain chest x-ray.  Exam overall reassuring.  Sensation intact.  Neuro exam nonfocal.  Patient does have pain in medial aspect of the upper left arm with shoulder flexion, and external rotation.  Pain during these range of motion's is worse along the tricep.  Patient is without bruising, or swelling in this area.  Radial pulses 2+.  CBC is without leukocytosis or anemia.  Slight leukopenia is noted.  BMP is unremarkable.  Troponin is less than 2.  Chest x-ray is without acute cardiopulmonary process.  EKG is without acute ischemic changes.  Patient on reevaluation is without any chest pain or shortness of breath.  Doubt ACS.  Doubt CVA.  Patient most likely has a muscle strain.  Will prescribe naproxen and Robaxin.  Discussed importance of follow-up with primary care provider.  She is also due to see her ENT tomorrow for ongoing issue with left ear.  She has been having drainage in this year.  She was referred to ENT by her primary care provider.  Has not taken any antibiotics.  No swelling along the neck.  Not radiate down the to raise concern for the ear.  Without cervical spinal process tenderness. denies any neck injury.  Doubt cervical radiculopathy.   Additional history obtained: -Additional history obtained from husband who is at bedside -External records from outside source obtained and reviewed including: Chart review including previous notes, labs, imaging, consultation notes   Lab Tests: -I ordered, reviewed,  and interpreted labs.   The pertinent results include:   Labs Reviewed  BASIC METABOLIC PANEL - Abnormal; Notable for the following components:      Result Value   Glucose, Bld 101 (*)    All other components within normal limits  CBC - Abnormal; Notable for the following components:   WBC 3.5 (*)    HCT 35.1 (*)    All other components within normal limits  I-STAT BETA HCG BLOOD, ED ([facility], [facility], [facility] ONLY)  TROPONIN I (HIGH SENSITIVITY)  TROPONIN I (HIGH SENSITIVITY)      EKG  EKG Interpretation  Date/Time:  Tuesday August 04 2021 10:33:38 EDT Ventricular Rate:  106 PR Interval:  152 QRS Duration: 74 QT Interval:  350 QTC Calculation: 464 R Axis:   75 Text Interpretation: Sinus tachycardia Nonspecific T wave abnormality Confirmed by Lajean Saver [phone]) on 08/04/2021 11:17:05 AM         Imaging Studies ordered: I ordered imaging studies including chest x-ray I independently visualized and interpreted imaging. I agree with the radiologist interpretation   Medicines ordered and prescription drug management: No orders of the defined types were placed in this encounter.   -I have reviewed the patients home medicines and have made adjustments as needed  Reevaluation: After the interventions noted above, I reevaluated the patient and found that they have :improved  Co morbidities that complicate the patient evaluation  Past Medical History:  Diagnosis Date   Ovarian cyst       Dispostion: Patient is appropriate for discharge.  Discharged in stable condition.  Return precautions discussed.   Final Clinical Impression(s) / ED Diagnoses Final diagnoses:  Muscle strain    Rx / DC Orders ED Discharge Orders          Ordered    naproxen (NAPROSYN) 375 MG tablet  2 times daily        08/04/21 1332    methocarbamol (ROBAXIN) 500 MG tablet  2 times daily        08/04/21 1332  Evlyn Courier, PA-C 08/04/21 1332    Lajean Saver, MD 08/05/21  1728

## 2021-08-04 NOTE — ED Triage Notes (Signed)
Patient c/o left shoulder pain around midnight. Sates when she woke up this morning the pain started moving down her arm. Patient states throughout the morning she has started to feel a heaviness and very anxious.

## 2021-08-27 ENCOUNTER — Telehealth: Payer: Self-pay | Admitting: Neurology

## 2021-08-27 NOTE — Telephone Encounter (Signed)
Error

## 2021-08-27 NOTE — Telephone Encounter (Signed)
Returned pt's call to get Botox appt rescheduled.

## 2021-08-27 NOTE — Telephone Encounter (Signed)
I called pt Lauren Levy for her to call [facility] to give consent to ship botox to the office.

## 2021-08-31 ENCOUNTER — Ambulatory Visit: Payer: Commercial Managed Care - HMO | Admitting: Neurology

## 2021-10-05 ENCOUNTER — Telehealth: Payer: Self-pay

## 2021-10-05 NOTE — Telephone Encounter (Signed)
I called [facility] and was able to schedule delivery of the botox for 8/29.

## 2021-10-05 NOTE — Telephone Encounter (Signed)
I called [facility] to schedule botox delivery. Patient consent to ship is needed and [facility] was unsuccessful at reaching her and therefore the botox cannot be shipped.  I called patient. She reports that she will be at her botox appointment on 10/07/21. She will call [facility] back today and give consent for shipping. I will try to call [facility] later today or tomorrow to try to schedule delivery again.

## 2021-10-06 NOTE — Telephone Encounter (Signed)
Received (1) 100 unit vial of Botox from [facility].

## 2021-10-07 ENCOUNTER — Ambulatory Visit: Payer: Commercial Managed Care - HMO | Admitting: Neurology

## 2021-10-07 VITALS — BP 128/87 | HR 96 | Ht 67.0 in | Wt 167.0 lb

## 2021-10-07 DIAGNOSIS — G248 Other dystonia: Secondary | ICD-10-CM | POA: Diagnosis not present

## 2021-10-07 DIAGNOSIS — G244 Idiopathic orofacial dystonia: Secondary | ICD-10-CM

## 2021-10-07 MED ORDER — ONABOTULINUMTOXINA 100 UNITS IJ SOLR: 100.0000 [IU] | Freq: Once | INTRAMUSCULAR | Status: AC

## 2021-10-07 NOTE — Progress Notes (Addendum)
Chief Complaint  Patient presents with   Procedure    Room 14       ASSESSMENT AND PLAN  Lauren Levy is a 51 y.o. female  Right lower extremity task specific dystonia,  Right ankle plantarflexion, upon ambulation, also have facial dystonia  Patient is very much bothered by her symptoms, desire further evaluation by academic center, will refer to management specialist at [facility] Drs. Siddiqui  Orofacial dystonia  Botox A, 200 units,(100 units/ dissolved into 2 cc of normal saline,) Under EMG guidance  Right flexor digitorum longus 50 units Right tibialis posterior 80 units Right flexor digitorum brevis 30 units  Right corrugate 5 units Left corrugate 5 units Procerus 5 units  Right orbicularis oculi at 5,6,7,8,9,  clock position (2.5 unitsx5=12.5 uits) Left orbicularis oculi at 2, 3,4,5,6 clock position (2.5 unitsx5=12.5 units)   DIAGNOSTIC DATA (LABS, IMAGING, TESTING) - I reviewed patient records, labs, notes, testing and imaging myself where available. 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 Felecia Shelling for evaluation of EMG guided botulism toxin injection for right lower extremity dystonia, her primary care physician is Dr. Jacelyn Grip, [place]    She is a native of [place], had education and [name], has immigrated to [place] more than a decade ago.  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, right leg difficulty is slow worsening over the past few months  Personally reviewed MRIs MRI of the brain in August 2022 no acute abnormality MRI of cervical, thoracic spine showed mild degenerative disease, no evidence of canal foraminal narrowing MRI of lumbar mild degenerative changes, disc and facet degeneration at L4-5, no significant canal foraminal narrowing  She was treated with Artane '2mg'$  tid and clonazepam 0.5 mg twice daily since Jan 2023, complains of excessive sleepiness, dry mouth, fatigue, there was no significant improvement  She already noticed right leg dystonic movement, ankle plantarflexion, toe flexion, moving forward, she can walk nearly normal backwards, sideways.  During today's interview, she was noted to have frequent grimace, eye blinking movement, patient reported that has been gradually worse over the past 3 to 4 years   She has 3 children, one of her oldest daughter at age 58 suffered motor tic for few years, tongue protruding, neck jerking movement, now has improved  She denies a family history of movement disorder otherwise  Update October 07, 2021: She reports responded well to previous injection, did has better posturing of her right ankle, she was seen  by [facility] neurologist in August, x-ray of right foot found osseous hypertrophy of the dorsal Lisfranc joint, dorsal plantar exposure shows osseous remodeling at the medial Lisfranc joint suggesting old fracture  dislocation injury, there was no evidence of acute fracture or dislocation  She was put on right ankle brace, was not sure about the benefit, she is 4 months out from previous injection, she did notice wearing off the previous benefit  In addition, today is also concerned frequent facial grimace, especially under stress, hope to receive injection as well,   PHYSICAL EXAM:   PHYSICAL EXAMNIATION:  Gen: NAD, conversant, well nourised, well groomed                     Cardiovascular: Regular rate rhythm, no peripheral edema, warm, nontender. Eyes: Conjunctivae clear without exudates or hemorrhage Neck: Supple, no carotid bruits. Pulmonary: Clear to auscultation bilaterally   NEUROLOGICAL EXAM:  MENTAL STATUS: Speech/cognition: Awake, alert, oriented to history taking and casual conversation, frequent eye blinking, facial grimace, CRANIAL NERVES: CN II: Visual fields are full to confrontation. Pupils are round equal and briskly reactive to light. CN III, IV, VI: extraocular movement are normal. No ptosis. CN V: Facial sensation is intact to light touch CN VII: Face is symmetric with normal eye closure  CN VIII: Hearing is normal to causal conversation. CN IX, X: Phonation is normal. CN XI: Head turning and shoulder shrug are intact  MOTOR: Tendency for right ankle plantarflexion  REFLEXES: Reflexes are 2+ and symmetric at the biceps, triceps, knees, and ankles. Plantar responses are flexor.  SENSORY: Intact to light touch,  and vibratory sensation   COORDINATION: There is no trunk or limb dysmetria noted.  GAIT/STANCE: She can get up from seated position arm crossed, noticeable right ankle plantarflexion, crossover to left side when moving forward, nearly normal gesture moving backwards, and sideways  REVIEW OF SYSTEMS:  Full 14 system review of systems performed and notable only for as above All other review of systems were negative.   ALLERGIES: No Known  Allergies  HOME MEDICATIONS: Current Outpatient Medications  Medication Sig Dispense Refill   botulinum toxin Type A (BOTOX) 100 units SOLR injection Inject 100 Units into the muscle every 3 (three) months. To be injected by MD in the office 1 each 3   Cholecalciferol (VITAMIN D3) 125 MCG (5000 UT) CAPS Take 1 tablet by mouth daily.     clonazePAM (KLONOPIN) 0.5 MG tablet TAKE ONE TABLET BY MOUTH TWICE A DAY AS NEEDED FOR ANXIETY 60 tablet 3   Krill Oil (OMEGA-3) 500 MG CAPS Take 1 tablet by mouth 2 (two) times daily. Omega Woman w evening Primrose oil And '76mg'$  GLA     methocarbamol (ROBAXIN) 500 MG tablet Take 1 tablet (500 mg total) by mouth 2 (two) times daily. 20 tablet 0   Multiple Vitamins-Minerals (CENTRUM SILVER 50+WOMEN) TABS Take 1 tablet by mouth daily.     naproxen (NAPROSYN) 375 MG tablet Take 1 tablet (375 mg total) by mouth 2 (two) times daily. 20 tablet 0   trihexyphenidyl (ARTANE) 2 MG tablet Restart at 2 mg TID to ensure tolerates. If tolerates may increase to 1-2 tablet TID. 180 tablet 5   vitamin B-12 (CYANOCOBALAMIN) 1000 MCG tablet Take 1 tablet by mouth daily.     No current facility-administered medications for this visit.    PAST MEDICAL HISTORY: Past Medical History:  Diagnosis Date   Ovarian cyst     PAST SURGICAL HISTORY: Past  Surgical History:  Procedure Laterality Date   BREAST BIOPSY Right 02/16/2021   papilloma   BREAST LUMPECTOMY WITH RADIOACTIVE SEED LOCALIZATION Right 04/14/2021   Procedure: RIGHT BREAST LUMPECTOMY WITH RADIOACTIVE SEED LOCALIZATION;  Surgeon: Erroll Luna, MD;  Location: [facility];  Service: General;  Laterality: Right;   MINOR CARPAL TUNNEL Right     FAMILY HISTORY: Family History  Problem Relation Age of Onset   High blood pressure Mother    Osteoporosis Mother    Arthritis Mother    Arthritis Father    Osteoporosis Father    Breast cancer Paternal Aunt     SOCIAL HISTORY: Social History    Socioeconomic History   Marital status: Married    Spouse name: Claiborne Billings   Number of children: 3   Years of education: Not on file   Highest education level: Bachelor's degree (e.g., BA, AB, BS)  Occupational History   Not on file  Tobacco Use   Smoking status: Former    Types: Cigarettes    Quit date: 2005    Years since quitting: 18.6   Smokeless tobacco: Never  Vaping Use   Vaping Use: Never used  Substance and Sexual Activity   Alcohol use: Yes    Comment: occ   Drug use: No   Sexual activity: Not on file  Other Topics Concern   Not on file  Social History Narrative   Live with husband and children   Left handed   Caffeine: 2 cups of tea a day, 1 soda a day   Social Determinants of Health   Financial Resource Strain: Not on file  Food Insecurity: Not on file  Transportation Needs: Not on file  Physical Activity: Not on file  Stress: Not on file  Social Connections: Not on file  Intimate Partner Violence: Not on file      Marcial Pacas, M.D. Ph.D.  [facility] [address], [place], [place] [address] Ph: [phone] Fax: [phone]  CC:  Vernie Shanks, MD No address on file  Vernie Shanks, MD (Inactive)

## 2021-10-07 NOTE — Progress Notes (Signed)
Botox 100 units x 1 vial   [id number] [id number] Exp-12/2023 sp

## 2021-10-08 ENCOUNTER — Telehealth: Payer: Self-pay | Admitting: Neurology

## 2021-10-08 ENCOUNTER — Other Ambulatory Visit: Payer: Self-pay | Admitting: Family Medicine

## 2021-10-08 DIAGNOSIS — Z9889 Other specified postprocedural states: Secondary | ICD-10-CM

## 2021-10-08 NOTE — Telephone Encounter (Signed)
[place], MD  Adult Neurology - [facility]  [place], [place] [address]  Get Directions Appointments  [phone])

## 2021-11-01 ENCOUNTER — Emergency Department (HOSPITAL_COMMUNITY): Payer: Commercial Managed Care - HMO

## 2021-11-01 ENCOUNTER — Emergency Department (HOSPITAL_COMMUNITY)
Admission: EM | Admit: 2021-11-01 | Discharge: 2021-11-02 | Disposition: A | Discharge status: Home / Self Care | Payer: Commercial Managed Care - HMO | Attending: Emergency Medicine | Admitting: Emergency Medicine

## 2021-11-01 ENCOUNTER — Encounter (HOSPITAL_COMMUNITY): Payer: Self-pay

## 2021-11-01 DIAGNOSIS — G2401 Drug induced subacute dyskinesia: Secondary | ICD-10-CM | POA: Insufficient documentation

## 2021-11-01 DIAGNOSIS — T887XXA Unspecified adverse effect of drug or medicament, initial encounter: Secondary | ICD-10-CM | POA: Insufficient documentation

## 2021-11-01 LAB — BASIC METABOLIC PANEL
Anion gap: 9 (ref 5–15)
BUN: 16 mg/dL (ref 6–20)
CO2: 22 mmol/L (ref 22–32)
Calcium: 9.8 mg/dL (ref 8.9–10.3)
Chloride: 106 mmol/L (ref 98–111)
Creatinine, Ser: 0.64 mg/dL (ref 0.44–1.00)
GFR, Estimated: >60 mL/min (ref >60)
Glucose, Bld: 100 mg/dL — ABNORMAL HIGH (ref 70–99)
Potassium: 3.8 mmol/L (ref 3.5–5.1)
Sodium: 137 mmol/L (ref 135–145)

## 2021-11-01 LAB — URINALYSIS, ROUTINE W REFLEX MICROSCOPIC
Bacteria, UA: NONE SEEN
Bilirubin Urine: NEGATIVE
Glucose, UA: NEGATIVE mg/dL
Hgb urine dipstick: NEGATIVE
Ketones, ur: NEGATIVE mg/dL
Nitrite: NEGATIVE
Protein, ur: NEGATIVE mg/dL
Specific Gravity, Urine: 1.019 (ref 1.005–1.030)
pH: 5 (ref 5.0–8.0)

## 2021-11-01 LAB — TROPONIN I (HIGH SENSITIVITY)
Troponin I (High Sensitivity): <2 ng/L (ref <18)
Troponin I (High Sensitivity): 2 ng/L (ref <18)

## 2021-11-01 LAB — CBC WITH DIFFERENTIAL/PLATELET
Abs Immature Granulocytes: 0.01 10*3/uL (ref 0.00–0.07)
Basophils Absolute: 0 10*3/uL (ref 0.0–0.1)
Basophils Relative: 0 %
Eosinophils Absolute: 0 10*3/uL (ref 0.0–0.5)
Eosinophils Relative: 1 %
HCT: 37 % (ref 36.0–46.0)
Hemoglobin: 12.6 g/dL (ref 12.0–15.0)
Immature Granulocytes: 0 %
Lymphocytes Relative: 40 %
Lymphs Abs: 1.7 10*3/uL (ref 0.7–4.0)
MCH: 30.3 pg (ref 26.0–34.0)
MCHC: 34.1 g/dL (ref 30.0–36.0)
MCV: 88.9 fL (ref 80.0–100.0)
Monocytes Absolute: 0.5 10*3/uL (ref 0.1–1.0)
Monocytes Relative: 10 %
Neutro Abs: 2.1 10*3/uL (ref 1.7–7.7)
Neutrophils Relative %: 49 %
Platelets: 373 10*3/uL (ref 150–400)
RBC: 4.16 MIL/uL (ref 3.87–5.11)
RDW: 12.3 % (ref 11.5–15.5)
WBC: 4.3 10*3/uL (ref 4.0–10.5)
nRBC: 0 % (ref 0.0–0.2)

## 2021-11-01 LAB — RAPID URINE DRUG SCREEN, HOSP PERFORMED
Amphetamines: NOT DETECTED
Barbiturates: NOT DETECTED
Benzodiazepines: NOT DETECTED
Cocaine: NOT DETECTED
Opiates: NOT DETECTED
Tetrahydrocannabinol: NOT DETECTED

## 2021-11-01 LAB — MAGNESIUM: Magnesium: 2.4 mg/dL (ref 1.7–2.4)

## 2021-11-01 MED ORDER — LORAZEPAM 2 MG/ML IJ SOLN
1.0000 mg | Freq: Once | INTRAMUSCULAR | Status: AC
  Administered 2021-11-01: 1 mg via INTRAVENOUS
  Filled 2021-11-01: qty 1

## 2021-11-01 MED ORDER — PROPRANOLOL HCL 20 MG PO TABS
20.0000 mg | ORAL_TABLET | Freq: Once | ORAL | Status: AC
  Administered 2021-11-01: 20 mg via ORAL
  Filled 2021-11-01: qty 1

## 2021-11-01 NOTE — ED Provider Triage Note (Signed)
Emergency Medicine Provider Triage Evaluation Note  Lauren Levy , a 51 y.o. female  was evaluated in triage.  Pt complains of restlessness and involuntary body movements.  Also complaining of chest tightness and throat tightness.  Patient is on numerous psychiatric medications.  The symptoms started approximately 1 week ago, but have increased over the past 2 days.  She states she was started on risperidone recently in addition to her other psychiatric medications.  Shortly after starting risperidone she developed her symptoms.  Called her psychiatrist who told her to stop taking the medication.   Review of Systems  Positive: As above Negative: Shortness of breath, difficulty breathing  Physical Exam  BP (!) 141/104 (BP Location: Left Arm)   Pulse (!) 120   Temp 99 F (37.2 C) (Oral)   Resp (!) 24   Ht '5\' 7"'$  (1.702 m)   Wt 76.2 kg   LMP 03/10/2016   SpO2 100%   BMI 26.30 kg/m  Gen:   Awake, no distress   Resp:  Normal effort  MSK:   Moves extremities without difficulty  Other:  Patient displaying dyskinesia and akathisia  Medical Decision Making  Medically screening exam initiated at 5:28 PM.  Appropriate orders placed.  [name] was informed that the remainder of the evaluation will be completed by another provider, this initial triage assessment does not replace that evaluation, and the importance of remaining in the ED until their evaluation is complete.  We will obtain basic labs, troponin, EKG, chest x-ray   Roylene Reason, Hershal Coria 11/01/21 1730

## 2021-11-01 NOTE — ED Notes (Signed)
Called patient to collect urine sample and troponin but no response. x1

## 2021-11-01 NOTE — ED Provider Notes (Signed)
[facility] Emergency Department Provider Note MRN:  [id number]  Arrival date & time: 11/02/21     Chief Complaint   Medication Reaction  History of Present Illness   Lauren Levy is a 51 y.o. year-old female presents to the ED with chief complaint of dyskinesias thought secondary Risperdal.  She has discontinued the Risperdal, but reports having worsening symptoms over the past 2 weeks.  She has tried taking hydroxyzine without relief.  She states that she has been under a lot of stress and feels overwhelmed.  She denies recent illness.  History provided by patient.   Review of Systems  Pertinent positive and negative review of systems noted in HPI.    Physical Exam   Vitals:   11/02/21 0000 11/02/21 0045  BP: 98/74 101/77  Pulse: 80 82  Resp: 20 20  Temp:  98.5 F (36.9 C)  SpO2: 100% 100%    CONSTITUTIONAL:  anxious-appearing, NAD NEURO:  Alert and oriented x 3, CN 3-12 grossly intact EYES:  eyes equal and reactive ENT/NECK:  Supple, no stridor  CARDIO:  tachycardic, regular rhythm, appears well-perfused  PULM:  No respiratory distress, CTAB GI/GU:  non-distended, non tender  MSK/SPINE:  No gross deformities, no edema, moves all extremities  SKIN:  no rash, atraumatic PSYCH: anxious   *Additional and/or pertinent findings included in MDM below  Diagnostic and Interventional Summary    EKG Interpretation  Date/Time:  Sunday November 01 2021 17:55:11 EDT Ventricular Rate:  116 PR Interval:  148 QRS Duration: 85 QT Interval:  315 QTC Calculation: 438 R Axis:   60 Text Interpretation: Sinus tachycardia Right atrial enlargement Abnormal R-wave progression, early transition Nonspecific T abnormalities, diffuse leads Baseline wander in lead(s) II III aVF increased rate from prior 6/23 Confirmed by Aletta Edouard [phone]) on 11/01/2021 10:25:38 PM       Labs Reviewed  BASIC METABOLIC PANEL - Abnormal; Notable for the following  components:      Result Value   Glucose, Bld 100 (*)    All other components within normal limits  URINALYSIS, ROUTINE W REFLEX MICROSCOPIC - Abnormal; Notable for the following components:   Leukocytes,Ua SMALL (*)    All other components within normal limits  CBC WITH DIFFERENTIAL/PLATELET  RAPID URINE DRUG SCREEN, HOSP PERFORMED  MAGNESIUM  TROPONIN I (HIGH SENSITIVITY)  TROPONIN I (HIGH SENSITIVITY)    DG Chest 2 View  Final Result      Medications  propranolol (INDERAL) tablet 20 mg (20 mg Oral Given 11/01/21 2232)  LORazepam (ATIVAN) injection 1 mg (1 mg Intravenous Given 11/01/21 2232)     Procedures  /  Critical Care Procedures  ED Course and Medical Decision Making  I have reviewed the triage vital signs, the nursing notes, and pertinent available records from the EMR.  Social Determinants Affecting Complexity of Care: Patient has no clinically significant social determinants affecting this chief complaint..   ED Course:    Medical Decision Making Patient here with tardive dyskinesias following Risperdal use.  She does not appear toxic, but does seem uncomfortable due to her dyskinesias.  We will trial a dose of Ativan and propranolol.  1:06 AM Patient reassessed, she states that she feels much much better.  She is able to ambulate and feels well.  She states she is ready to be discharged.  I will give her a prescription for 5 tablets of propranolol and Ativan in case symptoms return.  I have encouraged her to follow-up with  her primary care doctor.  Amount and/or Complexity of Data Reviewed Labs: ordered.  Risk Prescription drug management.     Consultants: No consultations were needed in caring for this patient.   Treatment and Plan: Emergency department workup does not suggest an emergent condition requiring admission or immediate intervention beyond  what has been performed at this time. The patient is safe for discharge and has  been instructed to  return immediately for worsening symptoms, change in  symptoms or any other concerns  Patient discussed with attending physician, Dr. Ralene Bathe, who agrees with management plan.  Final Clinical Impressions(s) / ED Diagnoses     ICD-10-CM   1. Tardive dyskinesia  G24.01       ED Discharge Orders          Ordered    propranolol (INDERAL) 10 MG tablet  Once PRN        11/02/21 0104    LORazepam (ATIVAN) 1 MG tablet  Once PRN        11/02/21 0104              Discharge Instructions Discussed with and Provided to Patient:     Discharge Instructions      Take medications as directed.  Follow-up with your doctor.       Montine Circle, PA-C 11/02/21 0107    Quintella Reichert, MD 11/02/21 [phone]

## 2021-11-01 NOTE — ED Triage Notes (Signed)
Pt arrived via POV, c/o being unable to control mvmts.

## 2021-11-02 MED ORDER — PROPRANOLOL HCL 10 MG PO TABS: 10.0000 mg | ORAL_TABLET | Freq: Once | ORAL | 0 refills | Status: DC | PRN

## 2021-11-02 MED ORDER — LORAZEPAM 1 MG PO TABS: 1.0000 mg | ORAL_TABLET | Freq: Once | ORAL | 0 refills | Status: AC | PRN

## 2021-11-02 NOTE — Discharge Instructions (Signed)
Take medications as directed.  Follow-up with your doctor.

## 2021-11-04 ENCOUNTER — Ambulatory Visit
Admission: RE | Admit: 2021-11-04 | Discharge: 2021-11-04 | Disposition: A | Discharge status: Home / Self Care | Payer: Commercial Managed Care - HMO | Source: Ambulatory Visit | Attending: Family Medicine | Admitting: Family Medicine

## 2021-11-04 ENCOUNTER — Telehealth: Payer: Self-pay | Admitting: Neurology

## 2021-11-04 ENCOUNTER — Other Ambulatory Visit: Payer: Self-pay | Admitting: Family Medicine

## 2021-11-04 DIAGNOSIS — Z139 Encounter for screening, unspecified: Secondary | ICD-10-CM

## 2021-11-04 DIAGNOSIS — Z9889 Other specified postprocedural states: Secondary | ICD-10-CM

## 2021-11-04 NOTE — Telephone Encounter (Signed)
LVM and sent mychart msg infoming pt of need to reschedule 11/22 appointment - MD out

## 2021-11-14 ENCOUNTER — Other Ambulatory Visit: Payer: Self-pay | Admitting: Neurology

## 2021-11-16 NOTE — Telephone Encounter (Signed)
Pt doesn't have an appt sch but has been checked in the registry.

## 2021-12-28 ENCOUNTER — Other Ambulatory Visit: Payer: Self-pay | Admitting: Neurology

## 2021-12-28 NOTE — Telephone Encounter (Signed)
I called pt. No answer, left a message asking pt to call me back.   

## 2021-12-28 NOTE — Telephone Encounter (Signed)
Pt is calling. Stated she would like to speak with a nurse about Botox injection. Stated a referral was sent to [facility] but doesn't;t want to have botox done there. Pt is requesting a call back from nurse.

## 2021-12-30 ENCOUNTER — Ambulatory Visit: Payer: Commercial Managed Care - HMO | Admitting: Neurology

## 2021-12-30 MED ORDER — CLONAZEPAM 0.5 MG PO TABS: ORAL_TABLET | ORAL | 0 refills | Status: AC

## 2021-12-30 NOTE — Telephone Encounter (Signed)
I called pt today.   She sts she would like to be followed at our office at this time since [facility] ([facility] is out of network for her.  She has r/s Botox for 01/12/2022 @ 430 with Dr. Krista Blue.  She did request a refill for clonazepam Last refill 12/13/2021 # 28 for a 14 day supply. This rx was given by [facility], but most recent refill was denied.   Looking at the chart Dr. Felecia Shelling originally sent this rx from our office, will forward for him to review/sign if appropriate.

## 2021-12-30 NOTE — Addendum Note (Signed)
Addended by: Verlin Grills on: 12/30/2021 10:22 AM   Modules accepted: Orders

## 2022-01-12 ENCOUNTER — Ambulatory Visit: Payer: Commercial Managed Care - HMO | Admitting: Neurology

## 2022-01-12 ENCOUNTER — Ambulatory Visit (INDEPENDENT_AMBULATORY_CARE_PROVIDER_SITE_OTHER): Payer: Commercial Managed Care - HMO | Admitting: Neurology

## 2022-01-12 ENCOUNTER — Telehealth: Payer: Self-pay | Admitting: Neurology

## 2022-01-12 ENCOUNTER — Encounter: Payer: Self-pay | Admitting: Neurology

## 2022-01-12 VITALS — BP 139/87 | HR 93 | Ht 67.0 in | Wt 167.0 lb

## 2022-01-12 DIAGNOSIS — G249 Dystonia, unspecified: Secondary | ICD-10-CM | POA: Diagnosis not present

## 2022-01-12 DIAGNOSIS — M79671 Pain in right foot: Secondary | ICD-10-CM

## 2022-01-12 DIAGNOSIS — F419 Anxiety disorder, unspecified: Secondary | ICD-10-CM | POA: Diagnosis not present

## 2022-01-12 DIAGNOSIS — G248 Other dystonia: Secondary | ICD-10-CM

## 2022-01-12 MED ORDER — ONABOTULINUMTOXINA 100 UNITS IJ SOLR: 100.0000 [IU] | Freq: Once | INTRAMUSCULAR | Status: AC

## 2022-01-12 NOTE — Progress Notes (Signed)
Botox 100 units x 1 vial  LOT#: [id number] EXP: 04/2024 NDC#: [id number]  SP

## 2022-01-12 NOTE — Progress Notes (Signed)
Chief Complaint  Patient presents with   Procedure    Rm 14.       ASSESSMENT AND PLAN  [facility] is a 51 y.o. female  Right lower extremity task specific dystonia,  Right ankle plantarflexion, upon ambulation, also have facial dystonia  Patient is very much bothered by her symptoms, desire further evaluation by academic center, will refer to management specialist at [facility] Drs. Siddiqui  Orofacial dystonia  Botox A, 200 units,(100 units/ dissolved into 2 cc of normal saline,) Under EMG guidance  Right flexor digitorum longus 50 units Right tibialis posterior 80 units Right flexor digitorum brevis 30 units  Right corrugate 5 units Left corrugate 5 units Procerus 5 units  Right orbicularis oculi at 5,6,7,8,9,  clock position (2.5 unitsx5=12.5 uits) Left orbicularis oculi at 2, 3,4,5,6 clock position (2.5 unitsx5=12.5 units)   DIAGNOSTIC DATA (LABS, IMAGING, TESTING) - I reviewed patient records, labs, notes, testing and imaging myself where available. Addendum: [facility] 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 Felecia Shelling for evaluation of EMG guided botulism toxin injection for right lower extremity dystonia, her primary care physician is Dr. Jacelyn Grip, [place]    She is a native of [place], had education and [name], has immigrated to [place] more than a decade ago.  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, right leg difficulty is slow worsening over the past few months  Personally reviewed MRIs MRI of the brain in August 2022 no acute abnormality MRI of cervical, thoracic spine showed mild degenerative disease, no evidence of canal foraminal narrowing MRI of lumbar mild degenerative changes, disc and facet degeneration at L4-5, no significant canal foraminal narrowing  She was treated with Artane '2mg'$  tid and clonazepam 0.5 mg twice daily since Jan 2023, complains of excessive sleepiness, dry mouth, fatigue, there was no significant improvement  She already noticed right leg dystonic movement, ankle plantarflexion, toe flexion, moving forward, she can walk nearly normal backwards, sideways.  During today's interview, she was noted to have frequent grimace, eye blinking movement, patient reported that has been gradually worse over the past 3 to 4 years   She has 3 children, one of her oldest daughter at age 67 suffered motor tic for few years, tongue protruding, neck jerking movement, now has improved  She denies a family history of movement disorder otherwise  Update October 07, 2021: She reports responded well to previous injection, did has better posturing of her right ankle, she was seen  by [facility] neurologist in August, x-ray of right foot found osseous hypertrophy of the dorsal Lisfranc joint, dorsal plantar exposure shows osseous remodeling at the medial Lisfranc joint suggesting old fracture  dislocation injury, there was no evidence of acute fracture or dislocation  She was put on right ankle brace, was not sure about the benefit, she is 4 months out from previous injection, she did notice wearing off the previous benefit  In addition, today is also concerned frequent facial grimace, especially under stress, hope to receive injection as well,   PHYSICAL EXAM:   PHYSICAL EXAMNIATION:  Gen: NAD, conversant, well nourised, well groomed                     Cardiovascular: Regular rate rhythm, no peripheral edema, warm, nontender. Eyes: Conjunctivae clear without exudates or hemorrhage Neck: Supple, no carotid bruits. Pulmonary: Clear to auscultation bilaterally   NEUROLOGICAL EXAM:  MENTAL STATUS: Speech/cognition: Awake, alert, oriented to history taking and casual conversation, frequent eye blinking, facial grimace, CRANIAL NERVES: CN II: Visual fields are full to confrontation. Pupils are round equal and briskly reactive to light. CN III, IV, VI: extraocular movement are normal. No ptosis. CN V: Facial sensation is intact to light touch CN VII: Face is symmetric with normal eye closure  CN VIII: Hearing is normal to causal conversation. CN IX, X: Phonation is normal. CN XI: Head turning and shoulder shrug are intact  MOTOR: Tendency for right ankle plantarflexion  REFLEXES: Reflexes are 2+ and symmetric at the biceps, triceps, knees, and ankles. Plantar responses are flexor.  SENSORY: Intact to light touch,  and vibratory sensation   COORDINATION: There is no trunk or limb dysmetria noted.  GAIT/STANCE: She can get up from seated position arm crossed, noticeable right ankle plantarflexion, crossover to left side when moving forward, nearly normal gesture moving backwards, and sideways  REVIEW OF SYSTEMS:  Full 14 system review of systems performed and notable only for as above All other review of systems were negative.   ALLERGIES: No Known  Allergies  HOME MEDICATIONS: Current Outpatient Medications  Medication Sig Dispense Refill   botulinum toxin Type A (BOTOX) 100 units SOLR injection Inject 100 Units into the muscle every 3 (three) months. To be injected by MD in the office 1 each 3   Cholecalciferol (VITAMIN D3) 125 MCG (5000 UT) CAPS Take 1 tablet by mouth daily.     clonazePAM (KLONOPIN) 0.5 MG tablet TAKE ONE TABLET BY MOUTH TWICE A DAY AS NEEDED FOR ANXIETY 60 tablet 0   Krill Oil (OMEGA-3) 500 MG CAPS Take 1 tablet by mouth 2 (two) times daily. Omega Woman w evening Primrose oil And '76mg'$  GLA     Multiple Vitamins-Minerals (CENTRUM SILVER 50+WOMEN) TABS Take 1 tablet by mouth daily.     propranolol (INDERAL) 10 MG tablet Take 1 tablet (10 mg total) by mouth once as needed for up to 5 days (tardive dyskinesia). 5 tablet 0   vitamin B-12 (CYANOCOBALAMIN) 1000 MCG tablet Take 1 tablet by mouth daily.     Current Facility-Administered Medications  Medication Dose Route Frequency Provider Last Rate Last Admin   botulinum toxin Type A (BOTOX) injection 100 Units  100 Units Intramuscular Once Marcial Pacas, MD        PAST MEDICAL HISTORY: Past Medical History:  Diagnosis Date   Ovarian cyst     PAST SURGICAL HISTORY: Past Surgical History:  Procedure Laterality Date   BREAST BIOPSY Right 02/16/2021  papilloma   BREAST LUMPECTOMY Right 04/14/2021   BREAST LUMPECTOMY WITH RADIOACTIVE SEED LOCALIZATION Right 04/14/2021   Procedure: RIGHT BREAST LUMPECTOMY WITH RADIOACTIVE SEED LOCALIZATION;  Surgeon: Erroll Luna, MD;  Location: [place];  Service: General;  Laterality: Right;   MINOR CARPAL TUNNEL Right     FAMILY HISTORY: Family History  Problem Relation Age of Onset   High blood pressure Mother    Osteoporosis Mother    Arthritis Mother    Arthritis Father    Osteoporosis Father    Breast cancer Paternal Aunt     SOCIAL HISTORY: Social History   Socioeconomic History   Marital  status: Married    Spouse name: Claiborne Billings   Number of children: 3   Years of education: Not on file   Highest education level: Bachelor's degree (e.g., BA, AB, BS)  Occupational History   Not on file  Tobacco Use   Smoking status: Former    Types: Cigarettes    Quit date: 2005    Years since quitting: 18.9   Smokeless tobacco: Never  Vaping Use   Vaping Use: Never used  Substance and Sexual Activity   Alcohol use: Yes    Comment: occ   Drug use: No   Sexual activity: Not on file  Other Topics Concern   Not on file  Social History Narrative   Live with husband and children   Left handed   Caffeine: 2 cups of tea a day, 1 soda a day   Social Determinants of Health   Financial Resource Strain: Not on file  Food Insecurity: Not on file  Transportation Needs: Not on file  Physical Activity: Not on file  Stress: Not on file  Social Connections: Not on file  Intimate Partner Violence: Not on file      Marcial Pacas, M.D. Ph.D.  [facility] [address] [place], [place] [address] Ph: [phone] Fax: [phone]  CC:  [facility] [address],  [place] [address]  [place], [place]

## 2022-01-12 NOTE — Telephone Encounter (Signed)
Please order BOTOX 300 units for next injection in Feb 2024.

## 2022-01-12 NOTE — Progress Notes (Addendum)
Chief Complaint  Patient presents with   Procedure    Rm 14.       ASSESSMENT AND PLAN  Lauren Levy is a 51 y.o. 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,  She was seen by movement specialist at [facility] December 01, 2021, anxiety, tearful during interview, increase propranolol to 20 mg 3 times daily dystonia,  trial of Sinemet 25/100 up to 1 tablets twice a day, 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, not confirmed at this time, continue Botox injection, who suggested higher dose, such as gastrocnemius, soleus to help with plantarflexion, lab work TSH B12 copper antiGAD Antibody, which were all negative  Denies significant improvement side effect with Sinemet 25/100 mg twice a day,  We can only do BOTOX 100 units today January 12, 2022, dosage use is limited due to supply of only 100 units from her specialty pharmacy    Botox A 100 units/ dissolved into 2 cc of normal saline,) Under EMG guidance  Right flexor digitorum longus 50 units Right tibialis posterior 50 units     Discussed with patient and her husband, they  [place], only return to clinic if needed  DIAGNOSTIC DATA (LABS, IMAGING, TESTING) - I reviewed patient records, labs, notes, testing and imaging myself where available.     MEDICAL HISTORY:  Lauren LAFEVERSis a 51year old female, accompanied by her husband of 19 years, referred by Dr. SFelecia Shellingfor evaluation of EMG guided botulism toxin injection for right lower  extremity dystonia, her primary care physician is Dr. WJacelyn Grip FDub Mikes   [place] a native of [name].  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, right leg difficulty is slow worsening over the past few months  Personally reviewed MRIs MRI of the brain in August 2022 no acute abnormality MRI of cervical, thoracic spine showed mild degenerative disease, no evidence of canal foraminal narrowing MRI of lumbar mild degenerative changes, disc and facet degeneration at L4-5, no significant canal foraminal narrowing  She was treated with Artane '2mg'$  tid and clonazepam 0.5 mg twice daily since Jan 2023, complains of excessive sleepiness, dry mouth, fatigue, there was no significant improvement  She already noticed right leg dystonic movement, ankle plantarflexion, toe flexion, moving forward, she can walk nearly normal backwards, sideways.  During today's interview, she was noted to have frequent grimace, eye blinking movement, patient reported that has been gradually worse over the past 3 to  4 years   She has 3 children, one of her oldest daughter at age 10 suffered motor tic for few years, tongue protruding, neck jerking movement, now has improved  She denies a family history of movement disorder otherwise  Update October 07, 2021: She reports responded well to previous injection, did has better  posturing of her right ankle, she was seen by [facility] neurologist in August, x-ray of right foot found osseous hypertrophy of the dorsal Lisfranc joint, dorsal plantar exposure shows osseous remodeling at the medial Lisfranc joint suggesting old fracture dislocation injury, there was no evidence of acute fracture or dislocation  She was put on right ankle brace, was not sure about the benefit, she is 4 months out from previous injection, she did notice wearing off the previous benefit  In addition, today is also concerned frequent facial grimace, especially under stress, hope to receive injection as well,  UPDATE Jan 12 2022: Patient is very much bothered by her symptoms, was seen by movement specialist at [place] of December 01, 2021, anxiety, tearful during interview, increase propranolol to 20 mg 3 times daily dystonia,  trial of Sinemet 25/100 up to 1 tablets twice a day, 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 Eyes: Conjunctivae clear without exudates or hemorrhage Neck: Supple, no carotid bruits. Pulmonary: Clear to auscultation bilaterally   NEUROLOGICAL EXAM:  MENTAL STATUS: Speech/cognition: Awake, alert, oriented to history taking and casual conversation, frequent eye blinking, facial grimace, CRANIAL NERVES: CN II: Visual fields are full to confrontation. Pupils are round equal and briskly reactive  to light. CN III, IV, VI: extraocular movement are normal. No ptosis. CN V: Facial sensation is intact to light touch CN VII: Face is symmetric with normal eye closure  CN VIII: Hearing is normal to causal conversation. CN IX, X: Phonation is normal. CN XI: Head turning and shoulder shrug are intact  MOTOR: Tendency for right ankle plantarflexion, but able to have full range of motion, normal motor strengths  REFLEXES: Reflexes are 2+ and symmetric at the biceps, triceps, knees, and ankles. Plantar responses are flexor.  SENSORY: Intact to light touch,  and vibratory sensation   COORDINATION: There is no trunk or limb dysmetria noted.  GAIT/STANCE: She can get up from seated position arm crossed, noticeable right ankle plantarflexion, crossover to left side when moving forward, forceful right toe flexion  REVIEW OF SYSTEMS:  Full 14 system review of systems performed and notable only for as above All other review of systems were negative.   ALLERGIES: No Known Allergies  HOME MEDICATIONS: Current Outpatient Medications  Medication Sig Dispense Refill   botulinum toxin Type A (BOTOX) 100 units SOLR injection Inject 100 Units into the muscle every 3 (three) months. To be injected by MD in the office 1 each 3   carbidopa-levodopa (SINEMET IR) 25-100 MG tablet Take 1 tablet by mouth 2 (  two) times daily.     Cholecalciferol (VITAMIN D3) 125 MCG (5000 UT) CAPS Take 1 tablet by mouth daily.     clonazePAM (KLONOPIN) 0.5 MG tablet TAKE ONE TABLET BY MOUTH TWICE A DAY AS NEEDED FOR ANXIETY 60 tablet 0   Krill Oil (OMEGA-3) 500 MG CAPS Take 1 tablet by mouth 2 (two) times daily. Omega Woman w evening Primrose oil And '76mg'$  GLA     Multiple Vitamins-Minerals (CENTRUM SILVER 50+WOMEN) TABS Take 1 tablet by mouth daily.     Thiamine HCl (B-1 PO) Take 1 tablet by mouth daily.     propranolol (INDERAL) 10 MG tablet Take 1 tablet (10 mg total) by mouth once as needed for up to 5 days (tardive  dyskinesia). 5 tablet 0   Current Facility-Administered Medications  Medication Dose Route Frequency Provider Last Rate Last Admin   botulinum toxin Type A (BOTOX) injection 100 Units  100 Units Intramuscular Once Marcial Pacas, MD       botulinum toxin Type A (BOTOX) injection 100 Units  100 Units Intramuscular Once Marcial Pacas, MD        PAST MEDICAL HISTORY: Past Medical History:  Diagnosis Date   Ovarian cyst     PAST SURGICAL HISTORY: Past Surgical History:  Procedure Laterality Date   BREAST BIOPSY Right 02/16/2021   papilloma   BREAST LUMPECTOMY Right 04/14/2021   BREAST LUMPECTOMY WITH RADIOACTIVE SEED LOCALIZATION Right 04/14/2021   Procedure: RIGHT BREAST LUMPECTOMY WITH RADIOACTIVE SEED LOCALIZATION;  Surgeon: Erroll Luna, MD;  Location: [place];  Service: General;  Laterality: Right;   MINOR CARPAL TUNNEL Right     FAMILY HISTORY: Family History  Problem Relation Age of Onset   High blood pressure Mother    Osteoporosis Mother    Arthritis Mother    Arthritis Father    Osteoporosis Father    Breast cancer Paternal Aunt     SOCIAL HISTORY: Social History   Socioeconomic History   Marital status: Married    Spouse name: Claiborne Billings   Number of children: 3   Years of education: Not on file   Highest education level: Bachelor's degree (e.g., BA, AB, BS)  Occupational History   Not on file  Tobacco Use   Smoking status: Former    Types: Cigarettes    Quit date: 2005    Years since quitting: 18.9   Smokeless tobacco: Never  Vaping Use   Vaping Use: Never used  Substance and Sexual Activity   Alcohol use: Yes    Comment: occ   Drug use: No   Sexual activity: Not on file  Other Topics Concern   Not on file  Social History Narrative   Live with husband and children   Left handed   Caffeine: 2 cups of tea a day, 1 soda a day   Social Determinants of Health   Financial Resource Strain: Not on file  Food Insecurity: Not on file   Transportation Needs: Not on file  Physical Activity: Not on file  Stress: Not on file  Social Connections: Not on file  Intimate Partner Violence: Not on file      Marcial Pacas, M.D. Ph.D.  [facility] [address] [place], [place] [address] Ph: [phone] Fax: [phone]  CC:  [facility] [address] [place],  [place] [address]  [place], [place]

## 2022-01-17 ENCOUNTER — Emergency Department (HOSPITAL_COMMUNITY): Payer: Commercial Managed Care - HMO

## 2022-01-17 ENCOUNTER — Other Ambulatory Visit: Payer: Self-pay

## 2022-01-17 ENCOUNTER — Encounter (HOSPITAL_COMMUNITY): Payer: Self-pay

## 2022-01-17 ENCOUNTER — Emergency Department (HOSPITAL_COMMUNITY)
Admission: EM | Admit: 2022-01-17 | Discharge: 2022-01-17 | Disposition: A | Discharge status: Home / Self Care | Payer: Commercial Managed Care - HMO | Attending: Emergency Medicine | Admitting: Emergency Medicine

## 2022-01-17 DIAGNOSIS — F449 Dissociative and conversion disorder, unspecified: Secondary | ICD-10-CM | POA: Diagnosis not present

## 2022-01-17 DIAGNOSIS — R42 Dizziness and giddiness: Secondary | ICD-10-CM | POA: Diagnosis not present

## 2022-01-17 DIAGNOSIS — R531 Weakness: Secondary | ICD-10-CM | POA: Insufficient documentation

## 2022-01-17 LAB — COMPREHENSIVE METABOLIC PANEL
ALT: 19 U/L (ref 0–44)
AST: 21 U/L (ref 15–41)
Albumin: 4.7 g/dL (ref 3.5–5.0)
Alkaline Phosphatase: 69 U/L (ref 38–126)
Anion gap: 10 (ref 5–15)
BUN: 13 mg/dL (ref 6–20)
CO2: 26 mmol/L (ref 22–32)
Calcium: 9.9 mg/dL (ref 8.9–10.3)
Chloride: 103 mmol/L (ref 98–111)
Creatinine, Ser: 0.71 mg/dL (ref 0.44–1.00)
GFR, Estimated: >60 mL/min (ref >60)
Glucose, Bld: 123 mg/dL — ABNORMAL HIGH (ref 70–99)
Potassium: 4 mmol/L (ref 3.5–5.1)
Sodium: 139 mmol/L (ref 135–145)
Total Bilirubin: 0.6 mg/dL (ref 0.3–1.2)
Total Protein: 8.4 g/dL — ABNORMAL HIGH (ref 6.5–8.1)

## 2022-01-17 LAB — URINALYSIS, ROUTINE W REFLEX MICROSCOPIC
Bacteria, UA: NONE SEEN
Bilirubin Urine: NEGATIVE
Glucose, UA: NEGATIVE mg/dL
Hgb urine dipstick: NEGATIVE
Ketones, ur: NEGATIVE mg/dL
Nitrite: NEGATIVE
Protein, ur: NEGATIVE mg/dL
Specific Gravity, Urine: >1.046 — ABNORMAL HIGH (ref 1.005–1.030)
pH: 5 (ref 5.0–8.0)

## 2022-01-17 LAB — RAPID URINE DRUG SCREEN, HOSP PERFORMED
Amphetamines: NOT DETECTED
Barbiturates: NOT DETECTED
Benzodiazepines: NOT DETECTED
Cocaine: NOT DETECTED
Opiates: NOT DETECTED
Tetrahydrocannabinol: NOT DETECTED

## 2022-01-17 LAB — CBC
HCT: 41.5 % (ref 36.0–46.0)
Hemoglobin: 13.5 g/dL (ref 12.0–15.0)
MCH: 29.5 pg (ref 26.0–34.0)
MCHC: 32.5 g/dL (ref 30.0–36.0)
MCV: 90.6 fL (ref 80.0–100.0)
Platelets: 411 10*3/uL — ABNORMAL HIGH (ref 150–400)
RBC: 4.58 MIL/uL (ref 3.87–5.11)
RDW: 13.3 % (ref 11.5–15.5)
WBC: 5.3 10*3/uL (ref 4.0–10.5)
nRBC: 0 % (ref 0.0–0.2)

## 2022-01-17 LAB — DIFFERENTIAL
Abs Immature Granulocytes: 0.02 10*3/uL (ref 0.00–0.07)
Basophils Absolute: 0 10*3/uL (ref 0.0–0.1)
Basophils Relative: 0 %
Eosinophils Absolute: 0.1 10*3/uL (ref 0.0–0.5)
Eosinophils Relative: 3 %
Immature Granulocytes: 0 %
Lymphocytes Relative: 44 %
Lymphs Abs: 2.3 10*3/uL (ref 0.7–4.0)
Monocytes Absolute: 0.3 10*3/uL (ref 0.1–1.0)
Monocytes Relative: 5 %
Neutro Abs: 2.6 10*3/uL (ref 1.7–7.7)
Neutrophils Relative %: 48 %

## 2022-01-17 LAB — I-STAT CHEM 8, ED
BUN: 13 mg/dL (ref 6–20)
Calcium, Ion: 1.25 mmol/L (ref 1.15–1.40)
Chloride: 101 mmol/L (ref 98–111)
Creatinine, Ser: 0.6 mg/dL (ref 0.44–1.00)
Glucose, Bld: 116 mg/dL — ABNORMAL HIGH (ref 70–99)
HCT: 44 % (ref 36.0–46.0)
Hemoglobin: 15 g/dL (ref 12.0–15.0)
Potassium: 4 mmol/L (ref 3.5–5.1)
Sodium: 139 mmol/L (ref 135–145)
TCO2: 27 mmol/L (ref 22–32)

## 2022-01-17 LAB — I-STAT BETA HCG BLOOD, ED (MC, WL, AP ONLY): I-stat hCG, quantitative: <5 m[IU]/mL (ref <5)

## 2022-01-17 LAB — APTT: aPTT: 24 seconds (ref 24–36)

## 2022-01-17 LAB — ETHANOL: Alcohol, Ethyl (B): <10 mg/dL (ref <10)

## 2022-01-17 LAB — PROTIME-INR
INR: 0.9 (ref 0.8–1.2)
Prothrombin Time: 12.4 seconds (ref 11.4–15.2)

## 2022-01-17 MED ORDER — IOHEXOL 350 MG/ML SOLN
100.0000 mL | Freq: Once | INTRAVENOUS | Status: AC | PRN
  Administered 2022-01-17: 100 mL via INTRAVENOUS

## 2022-01-17 NOTE — ED Notes (Signed)
Patient is attempting to get a UA. She states that he arm is feeling much better.

## 2022-01-17 NOTE — ED Triage Notes (Signed)
Pt arrived via POV. Pt at grocery store and at approx 1615 had weakness of L arm. No other S/S noted by pt.   Pt AOx4  Code stroke called on arrival.

## 2022-01-17 NOTE — Consult Note (Signed)
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 She was doing grocery shopping with her husband and around 4:15pm she felt left arm and hand weakness, not able to hold onto the door nob. Denies any other neuro symptoms, no weakness of left leg, no speech difficulty, no facial weakness, no LOC, HA, vision changes or shaking jerking. Never happened like this before. BP 144/95 and glucose 123. Exam showed inconsistent exam on left arm. CT no acute finding. CTA head and neck negative. Stat MRI no acute infarct.   She has hx of right foot task specific dystonia, following with Dr. Felecia Shelling and Dr. Krista Blue at [facility] for botox injection, and is also on sinemet, clonazepam and propranolol. She had dx of PTSD before and was put on Risperdal. On 11/01/21 she came to ED for tardive dyskinesia and Risperdal was discontinued.    LKW: 4:15pm tpa given?: No, inconsistent exam, suspect non-stroke IR Thrombectomy? No, no LVO Modified Rankin Scale: 0-Completely asymptomatic and back to baseline post- stroke   Exam: Vitals:   01/17/22 1640  BP: (!) 144/95  Pulse: 88  Resp: 12  SpO2: 100%     Pulse Rate:  [88] 88 (12/10 1640) Resp:  [12] 12 (12/10 1640) BP: (144)/(95) 144/95 (12/10 1640) SpO2:  [100 %] 100 % (12/10 1640)  General - Well nourished, well developed, in no apparent distress.  Ophthalmologic - fundi not visualized due to noncooperation.  Cardiovascular - Regular rhythm and rate.  Neuro - awake, alert, eyes open, orientated to age, place, time and people. No aphasia, fluent language, following all simple commands. Able to name and repeat and read. No gaze palsy, tracking bilaterally, visual field full, PERRL. No facial droop. Tongue midline. RUE 5/5, no drift. LUE initially not against gravity but with  encouragement, she was able to lift LUE without drift for 10 sec. Inconsistent exam with giveaway weakness seen on the exam. Bilaterally LEs 5/5, no drift. Sensation symmetrical bilaterally, right FTN intact, left FTN slow but eventually was able to complete, no ataxia seen. Gait not tested.  Marland Kitchen   NIH Stroke Scale = 0-1 due to left UE inconsistent exam   Imaging Reviewed:  MR BRAIN WO CONTRAST  Result Date: 01/17/2022 CLINICAL DATA:  Follow-up stroke EXAM: MRI HEAD WITHOUT CONTRAST TECHNIQUE: Multiplanar, multiecho pulse sequences of the brain and surrounding structures were obtained without intravenous contrast. COMPARISON:  CT studies immediately prior. FINDINGS: Brain: Diffusion imaging does not show any acute or subacute infarction. Axial FLAIR imaging shows only a few scattered punctate foci of increased signal within the white matter. No sign of large vessel territory insult. No hydrocephalus. No sign of hemorrhage. No extra-axial collection. Only axial diffusion and FLAIR imaging was performed emergently. IMPRESSION: 1. No acute finding. Few scattered punctate foci of FLAIR signal within the cerebral hemispheric white matter, often seen at this age. 2. Only axial diffusion and FLAIR imaging was performed emergently. Electronically Signed   By: Nelson Chimes M.D.   On: 01/17/2022 17:41   CT ANGIO HEAD NECK W WO CM (CODE STROKE)  Result Date: 01/17/2022 CLINICAL DATA:  Stroke suspected EXAM: CT ANGIOGRAPHY HEAD AND NECK TECHNIQUE: Multidetector CT imaging of the head and neck was performed using the standard protocol during bolus administration of intravenous contrast. Multiplanar CT image reconstructions and MIPs  were obtained to evaluate the vascular anatomy. Carotid stenosis measurements (when applicable) are obtained utilizing NASCET criteria, using the distal internal carotid diameter as the denominator. RADIATION DOSE REDUCTION: This exam was performed according to the departmental  dose-optimization program which includes automated exposure control, adjustment of the mA and/or kV according to patient size and/or use of iterative reconstruction technique. CONTRAST:  116m OMNIPAQUE IOHEXOL 350 MG/ML SOLN COMPARISON:  Same day CT Brain FINDINGS: CT HEAD FINDINGS Please see same-day CT brain for intracranial findings. No contrast-enhancing lesion is visualized. CTA NECK FINDINGS Aortic arch: Standard branching. Imaged portion shows no evidence of aneurysm or dissection. No significant stenosis of the major arch vessel origins. Right carotid system: No evidence of dissection, stenosis (50% or greater), or occlusion. Left carotid system: No evidence of dissection, stenosis (50% or greater), or occlusion. Vertebral arteries: Codominant. No evidence of dissection, stenosis (50% or greater), or occlusion. Skeleton: Negative. Other neck: Negative. Upper chest: Negative. Review of the MIP images confirms the above findings CTA HEAD FINDINGS Anterior circulation: No significant stenosis, proximal occlusion, aneurysm, or vascular malformation. Posterior circulation: No significant stenosis, proximal occlusion, aneurysm, or vascular malformation. Venous sinuses: As permitted by contrast timing, patent. Anatomic variants: None Review of the MIP images confirms the above findings IMPRESSION: 1. No evidence of dissection or hemodynamically significant stenosis of the major arteries of the neck. 2. No significant stenosis, proximal occlusion, aneurysm, or vascular malformation of the intracranial arteries. 3. Please see same-day CT brain for intracranial findings. No contrast-enhancing lesion is visualized. Electronically Signed   By: HMarin RobertsM.D.   On: 01/17/2022 17:35   CT HEAD CODE STROKE WO CONTRAST  Result Date: 01/17/2022 CLINICAL DATA:  Code stroke.  Neuro deficit, acute, stroke suspected EXAM: CT HEAD WITHOUT CONTRAST TECHNIQUE: Contiguous axial images were obtained from the base of the  skull through the vertex without intravenous contrast. RADIATION DOSE REDUCTION: This exam was performed according to the departmental dose-optimization program which includes automated exposure control, adjustment of the mA and/or kV according to patient size and/or use of iterative reconstruction technique. COMPARISON:  MRI 09/23/2020 FINDINGS: Brain: Normal appearance without evidence of atrophy, old or acute infarction, mass lesion, hemorrhage, hydrocephalus or extra-axial collection. Vascular: No abnormal vascular finding. Skull: Normal Sinuses/Orbits: Clear/normal Other: None ASPECTS (AFentonStroke Program Early CT Score) - Ganglionic level infarction (caudate, lentiform nuclei, internal capsule, insula, M1-M3 cortex): 7 - Supraganglionic infarction (M4-M6 cortex): 3 Total score (0-10 with 10 being normal): 10 IMPRESSION: 1. Normal head CT. 2. Aspects is 10. These results were communicated to Dr. SQuinn Axeat 5:03 pm on 01/17/2022 by text page via the [facility] system. Electronically Signed   By: MNelson ChimesM.D.   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 Time onset 4:15pm. On exam, inconsistent exam on left UE with giveaway weakness. NIHSS 0-1 given inconsistent exam.CT no acute finding. CTA head and neck negative. Stat MRI no acute infarct. Pt not TNK candidate given non-consistent exam, suspect non stroke. No IR due to no LVO. 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   Recommendations:  Continue monitoring in ED.  If pt symptoms improves and back to baseline, she can be discharged from neuro standpoint [facility].   If symptoms persists, she may need overnight observation and PT/OT evaluation in am.  Continue home medication Discussed with  Dr. Alvino Chapel ED physician We will follow if she is still in [place] in am.  She needs close follow up with her neurologist in [place], Dr. Felecia Shelling or Dr Krista Blue on discharge.    Consult Participants: pt, husband, stroke response RN, RN, me Location of the provider: [facility] Location of the patient: [facility]  Time Code Stroke Page received:  4:35 Time neurologist arrived:  4:38 Time NIHSS completed: 4:58    This consult was provided via telemedicine with 2-way video and audio communication. The patient/family was informed that care would be provided in this way and agreed to receive care in this manner.   This patient is receiving care for possible acute neurological changes. There was 60 minutes of care by this provider at the time of service, including time for direct evaluation via telemedicine, review of medical records, imaging studies and discussion of findings with providers, the patient and/or family.  Rosalin Hawking, MD PhD Stroke Neurology 01/17/2022 5:21 PM

## 2022-01-17 NOTE — Progress Notes (Signed)
Telestroke RN note   9292: Code stroke activated via cart. EDP at bedside assessing patient.   1635: Per Dr. Erlinda Hong page sent at this time.   1638: Dr. Erlinda Hong virtually present on the cart.  1642: Patient taken to CT.   1658: Dr. Erlinda Hong assessing patient in CT. Inconsistent NIHSS due to symptoms resolving. Per Dr. Erlinda Hong NIHSS 0-1, MRS 0 & [place]   4462: Patient taken to MRI.   1734: Signed off cart.

## 2022-01-17 NOTE — ED Notes (Signed)
Patient transported to MRI 

## 2022-01-17 NOTE — ED Provider Notes (Signed)
[facility] DEPT Provider Note   CSN: [id number] Arrival date & time: 01/17/22  1627     History {Add pertinent medical, surgical, social history, OB history to HPI:1} Chief Complaint  Patient presents with   Extremity Weakness   Code Stroke    Lauren Levy is a 51 y.o. female.   Extremity Weakness  Patient presents with weakness in the left upper extremity.  Last normal right around 4:00.  Has a history of focal dystonia particularly in the right foot.  No headache.  No confusion.  States she got out of the car and had difficulty raising the arm and it slid off the car.  Able to walk.  Face symmetric.  No headache.  No vision change but states she does feel little dizzy.    Past Medical History:  Diagnosis Date   Ovarian cyst   Focal dystonia.  Home Medications Prior to Admission medications   Medication Sig Start Date End Date Taking? Authorizing Provider  botulinum toxin Type A (BOTOX) 100 units SOLR injection Inject 100 Units into the muscle every 3 (three) months. To be injected by MD in the office 05/20/21   Sater, Nanine Means, MD  carbidopa-levodopa (SINEMET IR) 25-100 MG tablet Take 1 tablet by mouth 2 (two) times daily.    [provider]  Cholecalciferol (VITAMIN D3) 125 MCG (5000 UT) CAPS Take 1 tablet by mouth daily.    [provider]  clonazePAM (KLONOPIN) 0.5 MG tablet TAKE ONE TABLET BY MOUTH TWICE A DAY AS NEEDED FOR ANXIETY 12/30/21   Sater, Nanine Means, MD  Javier Docker Oil (OMEGA-3) 500 MG CAPS Take 1 tablet by mouth 2 (two) times daily. Omega Woman w evening Primrose oil And '76mg'$  GLA    [provider]  Multiple Vitamins-Minerals (CENTRUM SILVER 50+WOMEN) TABS Take 1 tablet by mouth daily.    [provider]  propranolol (INDERAL) 10 MG tablet Take 1 tablet (10 mg total) by mouth once as needed for up to 5 days (tardive dyskinesia). 11/02/21 11/07/21  Montine Circle, PA-C  Thiamine HCl (B-1 PO) Take  1 tablet by mouth daily.    [provider]      Allergies    Patient has no known allergies.    Review of Systems   Review of Systems  Musculoskeletal:  Positive for extremity weakness.    Physical Exam Updated Vital Signs BP (!) 144/95   Pulse 88   LMP 03/10/2016   SpO2 100%  Physical Exam Vitals and nursing note reviewed.  HENT:     Head: Atraumatic.  Eyes:     Extraocular Movements: Extraocular movements intact.     Pupils: Pupils are equal, round, and reactive to light.     Comments: Visual fields intact by confrontation.  Cardiovascular:     Rate and Rhythm: Regular rhythm.  Musculoskeletal:        General: No tenderness.     Cervical back: Neck supple.  Neurological:     Mental Status: She is alert and oriented to person, place, and time.     Comments: Unable to lift arm on left side for finger-nose.  Does squeeze hand but not as strong as on the contralateral side.  Normal strength on right upper extremity.  Face symmetric.  Eye movements intact with visual fields grossly intact.  Good straight leg raise bilaterally.     ED Results / Procedures / Treatments   Labs (all labs ordered are listed, but only abnormal  results are displayed) Labs Reviewed  ETHANOL  PROTIME-INR  APTT  CBC  DIFFERENTIAL  COMPREHENSIVE METABOLIC PANEL  RAPID URINE DRUG SCREEN, HOSP PERFORMED  URINALYSIS, ROUTINE W REFLEX MICROSCOPIC  I-STAT CHEM 8, ED  I-STAT BETA HCG BLOOD, ED ([facility], [facility], [facility] ONLY)    EKG None  Radiology No results found.  Procedures Procedures  {Document cardiac monitor, telemetry assessment procedure when appropriate:1}  Medications Ordered in ED Medications - No data to display  ED Course/ Medical Decision Making/ A&P                           Medical Decision Making   patient presented with focal neurodeficits that began right around 4:00.  Does have previous history of focal dystonia.  Code stroke had been called.  I saw the patient.   Appears mostly isolated in the upper extremity.  Will get emergent head CT and neurology consult.  Differential diagnosis includes stroke, complicated migraine, functional deficit.  {Document critical care time when appropriate:1} {Document review of labs and clinical decision tools ie heart score, Chads2Vasc2 etc:1}  {Document your independent review of radiology images, and any outside records:1} {Document your discussion with family members, caretakers, and with consultants:1} {Document social determinants of health affecting pt's care:1} {Document your decision making why or why not admission, treatments were needed:1} Final Clinical Impression(s) / ED Diagnoses Final diagnoses:  None    Rx / DC Orders ED Discharge Orders     None

## 2022-01-17 NOTE — ED Notes (Signed)
Pt wedding rings, glasses, watch, and earrings were given to husband before going to MRI

## 2022-01-17 NOTE — ED Provider Triage Note (Signed)
Emergency Medicine Provider Triage Evaluation Note  Lauren Levy , a 51 y.o. female  was evaluated in triage.  Pt complains of left upper extremity weakness.  Patient reports symptoms acute onset approximately 30 minutes ago when she was walking at the grocery store.  Denies history of similar symptoms in the past.  Review of Systems  Positive: See above Negative:   Physical Exam  LMP 03/10/2016  Gen:   Awake, no distress   Resp:  Normal effort  MSK:   Moves extremities without difficulty  Other:  Left arm weakness and grip, flexion and extension of elbow when compared to right.  Medical Decision Making  Medically screening exam initiated at 4:35 PM.  Appropriate orders placed.  [name] was informed that the remainder of the evaluation will be completed by another provider, this initial triage assessment does not replace that evaluation, and the importance of remaining in the ED until their evaluation is complete.     Wilnette Kales, [place] 01/17/22 [phone]

## 2022-01-26 ENCOUNTER — Other Ambulatory Visit (HOSPITAL_COMMUNITY): Payer: Self-pay

## 2022-02-10 ENCOUNTER — Other Ambulatory Visit (HOSPITAL_COMMUNITY): Payer: Self-pay

## 2022-02-11 ENCOUNTER — Other Ambulatory Visit (HOSPITAL_COMMUNITY): Payer: Self-pay

## 2022-02-24 ENCOUNTER — Other Ambulatory Visit (HOSPITAL_COMMUNITY): Payer: Self-pay

## 2022-02-24 NOTE — Telephone Encounter (Signed)
Can you clarify if this is for 100 units of Botox or 300 units? See request below that asked me to order 300 units but per notes and RX it appears to be 100 units. Please advise.

## 2022-02-24 NOTE — Telephone Encounter (Signed)
[employer]-Benefit Verification [id number] Submitted!

## 2022-03-03 NOTE — Telephone Encounter (Signed)
     Will outreach Medical to initiate the PA.

## 2022-03-09 ENCOUNTER — Other Ambulatory Visit (HOSPITAL_COMMUNITY): Payer: Self-pay

## 2022-03-09 NOTE — Telephone Encounter (Signed)
Called [employer] to verify PA is required for Medical for Botox. They faxed the correct forms and they were filled out today and faxed back to [facility] on 03/09/2022 along with office notes and procedure notes. Will update once we receive word back form the insurance. Faxed to [phone].

## 2022-03-09 NOTE — Telephone Encounter (Signed)
Check in status on PA

## 2022-04-07 ENCOUNTER — Ambulatory Visit: Payer: Commercial Managed Care - HMO | Admitting: Neurology

## 2022-12-23 ENCOUNTER — Ambulatory Visit: Payer: Managed Care, Other (non HMO) | Admitting: Neurology

## 2022-12-23 ENCOUNTER — Telehealth: Payer: Self-pay

## 2022-12-23 ENCOUNTER — Encounter: Payer: Self-pay | Admitting: Neurology

## 2022-12-23 VITALS — BP 128/74 | Ht 67.0 in | Wt 189.0 lb

## 2022-12-23 DIAGNOSIS — G248 Other dystonia: Secondary | ICD-10-CM | POA: Diagnosis not present

## 2022-12-23 DIAGNOSIS — F419 Anxiety disorder, unspecified: Secondary | ICD-10-CM

## 2022-12-23 MED ORDER — DULOXETINE HCL 30 MG PO CPEP: 30.0000 mg | ORAL_CAPSULE | Freq: Every day | ORAL | 3 refills | Status: DC

## 2022-12-23 NOTE — Progress Notes (Signed)
Chief Complaint  Patient presents with   New Patient (Initial Visit)    Rm 15, NP, with husband, orofacial dystonia      ASSESSMENT AND PLAN  Lauren Levy is a 52 y.o. female  Orofacial dystonia Anxiety  Previously had right lower extremity dystonic movement, received few sessions of low-dose Botox a since April 2023, which has been helpful, she was noted to have facial grimace during previous visit, did improve with low-dose injection as well,   Her dystonic movement correlate with her worsening anxiety, her right lower extremity symptoms has much improved after right knee arthroscopic surgery  Prior authorization for xeomin 50 units, return to clinic for EMG guided injection for orofacial dystonia in 1 month   Add on Cymbalta at 30 mg, continue Remeron 7.5 mg every night, if Cymbalta helpful may titrating to higher dose  DIAGNOSTIC DATA (LABS, IMAGING, TESTING) - I reviewed patient records, labs, notes, testing and imaging myself where available.     MEDICAL HISTORY:  Lauren Levy is a 52 year old female, accompanied by her husband of 19 years, referred by Dr. Epimenio Foot for evaluation of EMG guided botulism toxin injection for right lower extremity dystonia, her primary care physician is Dr. Modesto Charon, Thelma Barge    She is a native of [place], had education and [place], has immigrated to [place] more than a decade ago.  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,  right leg difficulty is slow worsening over the past few months  Personally reviewed MRIs MRI of the brain in August 2022 no acute abnormality MRI of cervical, thoracic spine showed mild degenerative disease, no evidence of canal foraminal narrowing MRI of lumbar mild degenerative changes, disc and facet degeneration at L4-5, no significant canal foraminal narrowing  She was treated with Artane 2mg  tid and clonazepam 0.5 mg twice daily since Jan 2023, complains of excessive sleepiness, dry mouth, fatigue, there was no significant improvement  She already noticed right leg dystonic movement, ankle plantarflexion, toe flexion, moving forward, she can walk nearly normal backwards, sideways.  During today's interview, she was noted to have frequent grimace, eye blinking movement, patient reported that has been gradually worse over the past 3 to 4 years   She has 3 children, one of her oldest daughter at age 39 suffered motor tic for few years, tongue protruding, neck jerking movement, now has improved  She denies a family history of movement disorder otherwise  Back in August 2023, she was noted to have significant orofacial abnormal movement, this is triggered by her significant stress, her cousin suffered cardiac arrest while visiting her aunt passed away,  She began to receive every 3 months Botox a injection for her right lower extremity, which provide mild help, she was referred to [facility] movement specialist, since October 2023, more extensive lab evaluation showed normal or negative TSH B12 copper antiGAD Antibody, which were all negative, denies significant improvement side effect with Sinemet 25/100 mg twice a day,  She was started on Remeron 7.5 mg every night for anxiety, also higher dose of clonazepam 0.5 mg twice a day,  She reported excessive stress over the past 1 year, now her husband finally find a job, she feels much less stressful, she continue to work part-time job as a  Veterinary surgeon, over the past 1 year she complains of worsening oral facial grimace, could not open her eyes while talking, also feels throat tightness if her eyes open, sometimes have to pull over,  MRI of the brain without contrast December 2023 was normal, CT angiogram head and neck January 17, 2022 showed no large vessel disease   PHYSICAL EXAM:   PHYSICAL EXAMNIATION:  Gen: NAD, conversant, well nourised, well groomed                     Cardiovascular: Regular rate rhythm, no peripheral edema, warm, nontender. Eyes: Conjunctivae clear without exudates or hemorrhage Neck: Supple, no carotid bruits. Pulmonary: Clear to auscultation bilaterally   NEUROLOGICAL EXAM:  MENTAL STATUS: Speech/cognition: Awake, alert, oriented to history taking and casual conversation, frequent eye blinking, facial grimace, tight voice, CRANIAL NERVES: CN II: Visual fields are full to confrontation. Pupils are round equal and briskly reactive to light. CN III, IV, VI: extraocular movement are normal. No ptosis. CN V: Facial sensation is intact to light touch CN VII: Face is symmetric with normal eye closure  CN VIII: Hearing is normal to causal conversation. CN IX, X: Phonation is normal. CN XI: Head turning and shoulder shrug are intact  MOTOR: Normal motor strength, muscle tone  REFLEXES: Reflexes are 2+ and symmetric at the biceps, triceps, knees, and ankles. Plantar responses are flexor.  SENSORY: Intact to light touch,  and vibratory sensation   COORDINATION: There is no trunk or limb dysmetria noted.  GAIT/STANCE: She can get up from seated position arm crossed, dragging right leg some, steady  REVIEW OF SYSTEMS:  Full 14 system review of systems performed and notable only for as above All other review of systems were negative.   ALLERGIES: No Known Allergies  HOME MEDICATIONS: Current Outpatient Medications  Medication Sig Dispense Refill   Cholecalciferol (VITAMIN D3) 125 MCG  (5000 UT) CAPS Take 1 tablet by mouth daily.     clonazePAM (KLONOPIN) 0.5 MG tablet TAKE ONE TABLET BY MOUTH TWICE A DAY AS NEEDED FOR ANXIETY (Patient taking differently: Take 0.5 mg by mouth 2 (two) times daily.) 60 tablet 0   Cyanocobalamin (VITAMIN B 12 PO) Take 1 tablet by mouth daily.     Krill Oil (OMEGA-3) 500 MG CAPS Take 1 tablet by mouth daily.     Magnesium 200 MG TABS Take 2 tablets by mouth daily.     mirtazapine (REMERON) 7.5 MG tablet Take 7.5 mg by mouth at bedtime.     Multiple Vitamins-Minerals (CENTRUM SILVER 50+WOMEN) TABS Take 1 tablet by mouth daily.     propranolol (INDERAL) 20 MG tablet Take 20 mg by mouth 3 (three) times daily.     botulinum toxin Type A (BOTOX) 100 units SOLR injection Inject 100 Units into the muscle every 3 (three) months. To be injected by MD in the office (Patient not taking: Reported on 12/23/2022) 1 each 3   carbidopa-levodopa (SINEMET IR) 25-100 MG tablet Take 1 tablet by mouth 2 (two) times daily. (Patient not taking: Reported on 12/23/2022)     propranolol (INDERAL) 10 MG tablet Take 1 tablet (10 mg total) by mouth once as needed for  up to 5 days (tardive dyskinesia). (Patient not taking: Reported on 01/17/2022) 5 tablet 0   Current Facility-Administered Medications  Medication Dose Route Frequency Provider Last Rate Last Admin   botulinum toxin Type A (BOTOX) injection 100 Units  100 Units Intramuscular Once Levert Feinstein, MD       botulinum toxin Type A (BOTOX) injection 100 Units  100 Units Intramuscular Once Levert Feinstein, MD        PAST MEDICAL HISTORY: Past Medical History:  Diagnosis Date   Ovarian cyst     PAST SURGICAL HISTORY: Past Surgical History:  Procedure Laterality Date   BREAST BIOPSY Right 02/16/2021   papilloma   BREAST LUMPECTOMY Right 04/14/2021   BREAST LUMPECTOMY WITH RADIOACTIVE SEED LOCALIZATION Right 04/14/2021   Procedure: RIGHT BREAST LUMPECTOMY WITH RADIOACTIVE SEED LOCALIZATION;  Surgeon: Harriette Bouillon, MD;   Location: [facility];  Service: General;  Laterality: Right;   KNEE SURGERY Right    MINOR CARPAL TUNNEL Right     FAMILY HISTORY: Family History  Problem Relation Age of Onset   High blood pressure Mother    Osteoporosis Mother    Arthritis Mother    Arthritis Father    Osteoporosis Father    Breast cancer Paternal Aunt     SOCIAL HISTORY: Social History   Socioeconomic History   Marital status: Married    Spouse name: Tresa Endo   Number of children: 3   Years of education: Not on file   Highest education level: Bachelor's degree (e.g., BA, AB, BS)  Occupational History   Not on file  Tobacco Use   Smoking status: Former    Current packs/day: 0.00    Types: Cigarettes    Quit date: 2005    Years since quitting: 19.8   Smokeless tobacco: Never  Vaping Use   Vaping status: Never Used  Substance and Sexual Activity   Alcohol use: Yes    Comment: occ   Drug use: No   Sexual activity: Not on file  Other Topics Concern   Not on file  Social History Narrative   Live with husband and children   Left handed   Caffeine: 2 cups of tea a day, 1 soda a day   Social Determinants of Health   Financial Resource Strain: Not on file  Food Insecurity: Not on file  Transportation Needs: Not on file  Physical Activity: Not on file  Stress: Not on file  Social Connections: Not on file  Intimate Partner Violence: Not on file      Levert Feinstein, M.D. Ph.D.  [facility] [address] [place], [place] [address] Ph: [phone] Fax: [phone]  CC:  Kateri Mc, MD [address] [place],  [place] [address]  Ollen Bowl, MD

## 2022-12-23 NOTE — Telephone Encounter (Signed)
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

## 2022-12-27 NOTE — Telephone Encounter (Signed)
Completed [employer] PA form and placed in nurse pod for MD signature.

## 2023-01-03 NOTE — Telephone Encounter (Signed)
Faxed signed PA form along with OV notes to [place].

## 2023-01-05 NOTE — Telephone Encounter (Signed)
Received fax of approval from [facility] for buy/bill 50 units.  Auth#: [id number] (01/03/23-01/02/24)

## 2023-01-26 ENCOUNTER — Ambulatory Visit: Payer: Commercial Managed Care - HMO | Admitting: Neurology

## 2023-01-26 VITALS — BP 144/90 | HR 78

## 2023-01-26 DIAGNOSIS — G248 Other dystonia: Secondary | ICD-10-CM | POA: Diagnosis not present

## 2023-01-26 MED ORDER — INCOBOTULINUMTOXINA 50 UNITS IM SOLR
50.0000 [IU] | INTRAMUSCULAR | Status: AC
  Administered 2023-01-26: 50 [IU] via INTRAMUSCULAR

## 2023-01-26 NOTE — Progress Notes (Signed)
Chief Complaint  Patient presents with   Procedure    Rm 14 with child  Pt is well      ASSESSMENT AND PLAN  [name] is a 52 y.o. female  Orofacial dystonia Anxiety  Previously had right lower extremity dystonic movement, received few sessions of low-dose Botox a since April 2023, which has been helpful, she was noted to have facial grimace during previous visit, did improve with low-dose injection as well,   Her dystonic movement correlate with her worsening anxiety, her right lower extremity symptoms has much improved after right knee arthroscopic surgery  Prior authorization for xeomin 50 units, return to clinic for EMG guided injection for orofacial dystonia in 1 month   Add on Cymbalta at 30 mg, continue Remeron 7.5 mg every night, complaining emotional with Cymbalta 30 mg, will try high-dose 60 mg  EMG guided xeomin injection, 50 units was dissolving to 1 cc of normal saline    DIAGNOSTIC DATA (LABS, IMAGING, TESTING) - I reviewed patient records, labs, notes, testing and imaging myself where available.     MEDICAL HISTORY:  Lauren Levy is a 52 year old female, accompanied by her husband of 19 years, referred by Dr. Epimenio Foot for evaluation of EMG guided botulism toxin injection for right lower extremity dystonia, her primary care physician is Dr. Modesto Charon, Thelma Barge    She is a native of [place], had education and [place], has immigrated to [place] more than a decade ago.  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, right leg difficulty is slow worsening over the past few months  Personally reviewed MRIs MRI of the brain in August 2022 no acute abnormality MRI of cervical, thoracic spine showed mild degenerative disease, no evidence of canal foraminal narrowing MRI of lumbar mild degenerative changes, disc and facet degeneration at L4-5, no significant canal foraminal narrowing  She was treated with Artane 2mg  tid and clonazepam 0.5 mg twice daily since Jan 2023, complains of excessive sleepiness, dry mouth, fatigue, there was no significant improvement  She already noticed right leg dystonic movement, ankle plantarflexion, toe flexion, moving forward, she can walk nearly normal backwards, sideways.  During today's interview, she was noted to have frequent grimace, eye blinking movement, patient reported that has been gradually worse over the past 3 to 4 years   She has 3 children, one of her oldest daughter at age 69 suffered motor tic for few years, tongue protruding, neck jerking movement, now has improved  She denies a family history of movement disorder otherwise  Back in August 2023, she was noted to have significant orofacial abnormal movement, this is triggered by her significant stress, her cousin suffered cardiac arrest while visiting her aunt passed away,  She began to receive every 3 months Botox a injection for her right lower extremity, which provide mild help, she was referred to [facility] movement specialist, since October 2023, more extensive lab evaluation showed normal or negative TSH B12 copper antiGAD  Antibody, which were all negative, denies significant improvement side effect with Sinemet 25/100 mg twice a day,  She was started on Remeron 7.5 mg every night for anxiety, also higher dose of clonazepam 0.5 mg twice a day,  She reported excessive stress over the past 1 year, now her husband finally find a job, she  feels much less stressful, she continue to work part-time job as a Veterinary surgeon, over the past 1 year she complains of worsening oral facial grimace, could not open her eyes while talking, also feels throat tightness if her eyes open, sometimes have to pull over,  MRI of the brain without contrast December 2023 was normal, CT angiogram head and neck January 17, 2022 showed no large vessel disease   PHYSICAL EXAM:   PHYSICAL EXAMNIATION:  Gen: NAD, conversant, well nourised, well groomed                     Cardiovascular: Regular rate rhythm, no peripheral edema, warm, nontender. Eyes: Conjunctivae clear without exudates or hemorrhage Neck: Supple, no carotid bruits. Pulmonary: Clear to auscultation bilaterally   NEUROLOGICAL EXAM:  MENTAL STATUS: Speech/cognition: Awake, alert, oriented to history taking and casual conversation, frequent eye blinking, facial grimace, tight voice, CRANIAL NERVES: CN II: Visual fields are full to confrontation. Pupils are round equal and briskly reactive to light. CN III, IV, VI: extraocular movement are normal. No ptosis. CN V: Facial sensation is intact to light touch CN VII: Face is symmetric with normal eye closure  CN VIII: Hearing is normal to causal conversation. CN IX, X: Phonation is normal. CN XI: Head turning and shoulder shrug are intact  MOTOR: Normal motor strength, muscle tone  REFLEXES: Reflexes are 2+ and symmetric at the biceps, triceps, knees, and ankles. Plantar responses are flexor.  SENSORY: Intact to light touch,  and vibratory sensation   COORDINATION: There is no trunk or limb dysmetria noted.  GAIT/STANCE: She can get up from seated position arm crossed, dragging right leg some, steady  REVIEW OF SYSTEMS:  Full 14 system review of systems performed and notable only for as above All other review of systems were negative.   ALLERGIES: No Known Allergies  HOME MEDICATIONS: Current Outpatient Medications   Medication Sig Dispense Refill   botulinum toxin Type A (BOTOX) 100 units SOLR injection Inject 100 Units into the muscle every 3 (three) months. To be injected by MD in the office (Patient not taking: Reported on 12/23/2022) 1 each 3   Cholecalciferol (VITAMIN D3) 125 MCG (5000 UT) CAPS Take 1 tablet by mouth daily.     clonazePAM (KLONOPIN) 0.5 MG tablet TAKE ONE TABLET BY MOUTH TWICE A DAY AS NEEDED FOR ANXIETY (Patient taking differently: Take 0.5 mg by mouth 2 (two) times daily.) 60 tablet 0   Cyanocobalamin (VITAMIN B 12 PO) Take 1 tablet by mouth daily.     DULoxetine (CYMBALTA) 30 MG capsule Take 1 capsule (30 mg total) by mouth daily. 30 capsule 3   Krill Oil (OMEGA-3) 500 MG CAPS Take 1 tablet by mouth daily.     Magnesium 200 MG TABS Take 2 tablets by mouth daily.     mirtazapine (REMERON) 7.5 MG tablet Take 7.5 mg by mouth at bedtime.     Multiple Vitamins-Minerals (CENTRUM SILVER 50+WOMEN) TABS Take 1 tablet by mouth daily.     propranolol (INDERAL) 20 MG tablet Take 20 mg by mouth 3 (three) times daily.     Current Facility-Administered Medications  Medication  Dose Route Frequency Provider Last Rate Last Admin   botulinum toxin Type A (BOTOX) injection 100 Units  100 Units Intramuscular Once Levert Feinstein, MD       botulinum toxin Type A (BOTOX) injection 100 Units  100 Units Intramuscular Once Levert Feinstein, MD       incobotulinumtoxinA (XEOMIN) 50 units injection 50 Units  50 Units Intramuscular Q90 days Levert Feinstein, MD        PAST MEDICAL HISTORY: Past Medical History:  Diagnosis Date   Ovarian cyst     PAST SURGICAL HISTORY: Past Surgical History:  Procedure Laterality Date   BREAST BIOPSY Right 02/16/2021   papilloma   BREAST LUMPECTOMY Right 04/14/2021   BREAST LUMPECTOMY WITH RADIOACTIVE SEED LOCALIZATION Right 04/14/2021   Procedure: RIGHT BREAST LUMPECTOMY WITH RADIOACTIVE SEED LOCALIZATION;  Surgeon: Harriette Bouillon, MD;  Location: [facility];   Service: General;  Laterality: Right;   KNEE SURGERY Right    MINOR CARPAL TUNNEL Right     FAMILY HISTORY: Family History  Problem Relation Age of Onset   High blood pressure Mother    Osteoporosis Mother    Arthritis Mother    Arthritis Father    Osteoporosis Father    Breast cancer Paternal Aunt     SOCIAL HISTORY: Social History   Socioeconomic History   Marital status: Married    Spouse name: Tresa Endo   Number of children: 3   Years of education: Not on file   Highest education level: Bachelor's degree (e.g., BA, AB, BS)  Occupational History   Not on file  Tobacco Use   Smoking status: Former    Current packs/day: 0.00    Types: Cigarettes    Quit date: 2005    Years since quitting: 19.9   Smokeless tobacco: Never  Vaping Use   Vaping status: Never Used  Substance and Sexual Activity   Alcohol use: Yes    Comment: occ   Drug use: No   Sexual activity: Not on file  Other Topics Concern   Not on file  Social History Narrative   Live with husband and children   Left handed   Caffeine: 2 cups of tea a day, 1 soda a day   Social Drivers of Corporate investment banker Strain: Not on file  Food Insecurity: Not on file  Transportation Needs: Not on file  Physical Activity: Not on file  Stress: Not on file  Social Connections: Not on file  Intimate Partner Violence: Not on file      Levert Feinstein, M.D. Ph.D.  [facility] [address] [place], [place] [address] Ph: [phone] Fax: [phone]  CC:  Ollen Bowl, MD [address] [place],  [place] [address]  Ollen Bowl, MD

## 2023-01-26 NOTE — Progress Notes (Signed)
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

## 2023-02-10 ENCOUNTER — Institutional Professional Consult (permissible substitution): Payer: Managed Care, Other (non HMO) | Admitting: Neurology

## 2023-02-28 DIAGNOSIS — M9903 Segmental and somatic dysfunction of lumbar region: Secondary | ICD-10-CM | POA: Diagnosis not present

## 2023-02-28 DIAGNOSIS — M531 Cervicobrachial syndrome: Secondary | ICD-10-CM | POA: Diagnosis not present

## 2023-02-28 DIAGNOSIS — M5386 Other specified dorsopathies, lumbar region: Secondary | ICD-10-CM | POA: Diagnosis not present

## 2023-02-28 DIAGNOSIS — M9904 Segmental and somatic dysfunction of sacral region: Secondary | ICD-10-CM | POA: Diagnosis not present

## 2023-02-28 DIAGNOSIS — M62830 Muscle spasm of back: Secondary | ICD-10-CM | POA: Diagnosis not present

## 2023-02-28 DIAGNOSIS — M9901 Segmental and somatic dysfunction of cervical region: Secondary | ICD-10-CM | POA: Diagnosis not present

## 2023-02-28 DIAGNOSIS — M9902 Segmental and somatic dysfunction of thoracic region: Secondary | ICD-10-CM | POA: Diagnosis not present

## 2023-03-02 DIAGNOSIS — M9901 Segmental and somatic dysfunction of cervical region: Secondary | ICD-10-CM | POA: Diagnosis not present

## 2023-03-02 DIAGNOSIS — M9904 Segmental and somatic dysfunction of sacral region: Secondary | ICD-10-CM | POA: Diagnosis not present

## 2023-03-02 DIAGNOSIS — M9903 Segmental and somatic dysfunction of lumbar region: Secondary | ICD-10-CM | POA: Diagnosis not present

## 2023-03-02 DIAGNOSIS — M531 Cervicobrachial syndrome: Secondary | ICD-10-CM | POA: Diagnosis not present

## 2023-03-02 DIAGNOSIS — M5386 Other specified dorsopathies, lumbar region: Secondary | ICD-10-CM | POA: Diagnosis not present

## 2023-03-02 DIAGNOSIS — M62830 Muscle spasm of back: Secondary | ICD-10-CM | POA: Diagnosis not present

## 2023-03-02 DIAGNOSIS — M9902 Segmental and somatic dysfunction of thoracic region: Secondary | ICD-10-CM | POA: Diagnosis not present

## 2023-03-04 DIAGNOSIS — G248 Other dystonia: Secondary | ICD-10-CM | POA: Diagnosis not present

## 2023-03-04 DIAGNOSIS — R030 Elevated blood-pressure reading, without diagnosis of hypertension: Secondary | ICD-10-CM | POA: Diagnosis not present

## 2023-03-04 DIAGNOSIS — M6289 Other specified disorders of muscle: Secondary | ICD-10-CM | POA: Diagnosis not present

## 2023-03-16 ENCOUNTER — Other Ambulatory Visit (HOSPITAL_COMMUNITY): Payer: Self-pay | Admitting: Internal Medicine

## 2023-03-16 DIAGNOSIS — M2669 Other specified disorders of temporomandibular joint: Secondary | ICD-10-CM | POA: Diagnosis not present

## 2023-03-16 DIAGNOSIS — Z136 Encounter for screening for cardiovascular disorders: Secondary | ICD-10-CM

## 2023-03-18 ENCOUNTER — Ambulatory Visit (HOSPITAL_COMMUNITY)
Admission: RE | Admit: 2023-03-18 | Discharge: 2023-03-18 | Disposition: A | Discharge status: Home / Self Care | Payer: Self-pay | Source: Ambulatory Visit | Attending: Internal Medicine | Admitting: Internal Medicine

## 2023-03-18 DIAGNOSIS — Z136 Encounter for screening for cardiovascular disorders: Secondary | ICD-10-CM | POA: Insufficient documentation

## 2023-04-06 NOTE — Telephone Encounter (Signed)
 LVM asking pt to call back with insurance info

## 2023-04-11 NOTE — Telephone Encounter (Signed)
 Called pt again, she has new [employer] plan. I faxed completed auth and notes to [facility] @ [phone].

## 2023-04-18 NOTE — Telephone Encounter (Signed)
 Auth#: [id number] (04/11/23-04/09/24), pt will continue to be buy/bill 50 units.

## 2023-04-20 ENCOUNTER — Ambulatory Visit (HOSPITAL_COMMUNITY)
Admission: RE | Admit: 2023-04-20 | Discharge: 2023-04-20 | Disposition: A | Discharge status: Home / Self Care | Source: Ambulatory Visit | Attending: Cardiovascular Disease | Admitting: Cardiovascular Disease

## 2023-04-20 ENCOUNTER — Other Ambulatory Visit (HOSPITAL_COMMUNITY): Payer: Self-pay | Admitting: Medical

## 2023-04-20 DIAGNOSIS — M79604 Pain in right leg: Secondary | ICD-10-CM | POA: Diagnosis not present

## 2023-04-20 DIAGNOSIS — M7989 Other specified soft tissue disorders: Secondary | ICD-10-CM | POA: Diagnosis not present

## 2023-04-20 DIAGNOSIS — M25561 Pain in right knee: Secondary | ICD-10-CM | POA: Diagnosis not present

## 2023-04-24 ENCOUNTER — Other Ambulatory Visit: Payer: Self-pay | Admitting: Neurology

## 2023-04-26 ENCOUNTER — Ambulatory Visit (INDEPENDENT_AMBULATORY_CARE_PROVIDER_SITE_OTHER): Payer: Commercial Managed Care - HMO | Admitting: Neurology

## 2023-04-26 VITALS — BP 152/106

## 2023-04-26 DIAGNOSIS — F419 Anxiety disorder, unspecified: Secondary | ICD-10-CM | POA: Diagnosis not present

## 2023-04-26 DIAGNOSIS — G248 Other dystonia: Secondary | ICD-10-CM | POA: Diagnosis not present

## 2023-04-26 NOTE — Progress Notes (Signed)
 Chief Complaint  Patient presents with   Injections    Rm14, alone, Pt is well just has some questions for provider before injection is drawn up      ASSESSMENT AND PLAN  Lauren Levy is a 53 y.o. female  Orofacial dystonia Anxiety  Previously had right lower extremity dystonic movement, received few sessions of low-dose Botox in April 2023, which has been helpful, she was noted to have facial grimace during previous visit, did improve with low-dose injection as well,   Her right lower extremity symptoms has much improved after right knee arthroscopic surgery  Then developed significant  squinting of eye muscles, under extreme stress, worsening depression anxiety, low-dose Xeomin injection did help her squinting, but she is very tearful, complains of feeling anxious all the time, twisted body posturing, with essentially normal neurological examination   Discussed with patient and her husband, suggested PCP and psychiatrist refer   DIAGNOSTIC DATA (LABS, IMAGING, TESTING) - I reviewed patient records, labs, notes, testing and imaging myself where available.     MEDICAL HISTORY:  Lauren Levy is a 53 year old female, accompanied by her husband of 19 years, referred by Dr. Epimenio Foot for evaluation of EMG guided botulism toxin injection for right lower extremity dystonia, her primary care physician is Dr. Modesto Charon, Thelma Barge    She is a native of [place], had education and [place], has immigrated to [place] more than a decade ago.  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, right leg difficulty is slow worsening over the past few months  Personally reviewed MRIs MRI of the brain in August 2022 no acute abnormality MRI of cervical, thoracic spine showed mild degenerative disease, no evidence of canal foraminal narrowing MRI of lumbar mild degenerative changes, disc and facet degeneration at L4-5, no significant canal foraminal narrowing  She was treated with Artane 2mg  tid and clonazepam 0.5 mg twice daily since Jan 2023, complains of excessive sleepiness, dry mouth, fatigue, there was no significant improvement  She already noticed right leg dystonic movement, ankle plantarflexion, toe flexion, moving forward, she can walk nearly normal backwards, sideways.  During today's interview, she was noted to have frequent grimace, eye blinking movement, patient reported that has been gradually worse over the past 3 to 4 years   She has 3 children, one of her oldest daughter at age 68 suffered motor tic for few years, tongue protruding, neck jerking movement, now has improved  She denies a family history of movement disorder otherwise  Back in August 2023, she was noted to have significant orofacial abnormal movement, this is triggered by her significant stress, her cousin suffered cardiac arrest while visiting her aunt passed away,  She began to receive every 3 months Botox a injection for her right lower extremity, which provide mild help, she was referred to [facility] movement specialist, since October 2023, more extensive lab evaluation showed normal or negative TSH B12 copper antiGAD Antibody, which were all negative, denies significant  improvement side effect with Sinemet 25/100 mg twice a day,  She was started on Remeron 7.5 mg every night for anxiety, also higher dose of clonazepam 0.5 mg twice a day,  She reported excessive stress over the past 1 year,  now her husband finally find a job, she feels much less stressful, she continue to work part-time job as a Veterinary surgeon, over the past 1 year she complains of worsening oral facial grimace, could not open her eyes while talking, also feels throat tightness if her eyes open, sometimes have to pull over,  MRI of the brain without contrast December 2023 was normal, CT angiogram head and neck January 17, 2022 showed no large vessel disease  UPDATE April 26 2023: Low-dose Xeomin injection did help her facial squinting, but she continued dealing with a lot of anxiety, she also complains of large amplitude body twitching movement this started after a traumatic event from summer 2023  She lost her visiting niece at her home in August, shortly after that her husband lost her job from October 2023 until November 2024, she was treated with low-dose Risperdal for 1 month, after that she developed this large amplitude body twitching, squinting, could not open her eyes while talking, difficulty multitasking, could no longer show her house working as a Veterinary surgeon, tearful during today's visit, we also discussed her current condition with her husband on the phone, who is working out of state now  She tried Cymbalta 30 mg daily, did not help her symptoms  She does not want to continue her xeomin injection PHYSICAL EXAM:    04/26/2023    1:07 PM 01/26/2023    3:41 PM 12/23/2022    2:30 PM  Vitals with BMI  Height   5\' 7"   Weight   189 lbs  BMI   29.59  Systolic 152 144 161  Diastolic 106 90 74  Pulse  78      PHYSICAL EXAMNIATION:  Gen: NAD, conversant, well nourised, well groomed                     Cardiovascular: Regular rate rhythm, no peripheral edema, warm, nontender. Eyes: Conjunctivae clear without exudates or hemorrhage Neck: Supple, no carotid bruits. Pulmonary: Clear to auscultation bilaterally   NEUROLOGICAL EXAM:  MENTAL STATUS: Speech/cognition: Anxious looking middle-age female, tearful,,  frequent eye blinking, facial grimace, tight voice, twisted body posturing, CRANIAL NERVES: CN II: Visual fields are full to confrontation. Pupils are round equal and briskly reactive to light. CN III, IV, VI: extraocular movement are normal. No ptosis. CN V: Facial sensation is intact to light touch CN VII: Face is symmetric with normal eye closure  CN VIII: Hearing is normal to causal conversation. CN IX, X: Phonation is normal. CN XI: Head turning and shoulder shrug are intact  MOTOR: Normal motor strength, muscle tone  REFLEXES: Reflexes are 2+ and symmetric at the biceps, triceps, knees, and ankles. Plantar responses are flexor.  SENSORY: Intact to light touch,  and vibratory sensation   COORDINATION: There is no trunk or limb dysmetria noted.  GAIT/STANCE: She can get up from seated position arm crossed, steady  REVIEW OF SYSTEMS:  Full 14 system review of systems performed and notable only for as above All other review of systems were negative.   ALLERGIES: No Known Allergies  HOME MEDICATIONS: Current Outpatient Medications  Medication Sig Dispense Refill   botulinum toxin Type A (BOTOX) 100 units SOLR injection Inject 100 Units into the muscle every  3 (three) months. To be injected by MD in the office (Patient not taking: Reported on 12/23/2022) 1 each 3   Cholecalciferol (VITAMIN D3) 125 MCG (5000 UT) CAPS Take 1 tablet by mouth daily.     clonazePAM (KLONOPIN) 0.5 MG tablet TAKE ONE TABLET BY MOUTH TWICE A DAY AS NEEDED FOR ANXIETY (Patient taking differently: Take 0.5 mg by mouth 2 (two) times daily.) 60 tablet 0   Cyanocobalamin (VITAMIN B 12 PO) Take 1 tablet by mouth daily.     DULoxetine (CYMBALTA) 30 MG capsule TAKE 1 CAPSULE(30 MG) BY MOUTH DAILY 90 capsule 3   Krill Oil (OMEGA-3) 500 MG CAPS Take 1 tablet by mouth daily.     Magnesium 200 MG TABS Take 2 tablets by mouth daily.     mirtazapine (REMERON) 7.5 MG tablet Take 7.5 mg by mouth at bedtime.      Multiple Vitamins-Minerals (CENTRUM SILVER 50+WOMEN) TABS Take 1 tablet by mouth daily.     propranolol (INDERAL) 20 MG tablet Take 20 mg by mouth 3 (three) times daily.     Current Facility-Administered Medications  Medication Dose Route Frequency Provider Last Rate Last Admin   botulinum toxin Type A (BOTOX) injection 100 Units  100 Units Intramuscular Once Levert Feinstein, MD       botulinum toxin Type A (BOTOX) injection 100 Units  100 Units Intramuscular Once Levert Feinstein, MD       incobotulinumtoxinA (XEOMIN) 50 units injection 50 Units  50 Units Intramuscular Q90 days Levert Feinstein, MD   50 Units at 01/26/23 1726    PAST MEDICAL HISTORY: Past Medical History:  Diagnosis Date   Ovarian cyst     PAST SURGICAL HISTORY: Past Surgical History:  Procedure Laterality Date   BREAST BIOPSY Right 02/16/2021   papilloma   BREAST LUMPECTOMY Right 04/14/2021   BREAST LUMPECTOMY WITH RADIOACTIVE SEED LOCALIZATION Right 04/14/2021   Procedure: RIGHT BREAST LUMPECTOMY WITH RADIOACTIVE SEED LOCALIZATION;  Surgeon: Harriette Bouillon, MD;  Location: [facility];  Service: General;  Laterality: Right;   KNEE SURGERY Right    MINOR CARPAL TUNNEL Right     FAMILY HISTORY: Family History  Problem Relation Age of Onset   High blood pressure Mother    Osteoporosis Mother    Arthritis Mother    Arthritis Father    Osteoporosis Father    Breast cancer Paternal Aunt     SOCIAL HISTORY: Social History   Socioeconomic History   Marital status: Married    Spouse name: Tresa Endo   Number of children: 3   Years of education: Not on file   Highest education level: Bachelor's degree (e.g., BA, AB, BS)  Occupational History   Not on file  Tobacco Use   Smoking status: Former    Current packs/day: 0.00    Types: Cigarettes    Quit date: 2005    Years since quitting: 20.2   Smokeless tobacco: Never  Vaping Use   Vaping status: Never Used  Substance and Sexual Activity   Alcohol use: Yes     Comment: occ   Drug use: No   Sexual activity: Not on file  Other Topics Concern   Not on file  Social History Narrative   Live with husband and children   Left handed   Caffeine: 2 cups of tea a day, 1 soda a day   Social Drivers of Corporate investment banker Strain: Not on file  Food Insecurity: Not on file  Transportation Needs:  Not on file  Physical Activity: Not on file  Stress: Not on file  Social Connections: Not on file  Intimate Partner Violence: Not on file      Levert Feinstein, M.D. Ph.D.  [facility] [address] [place], [place] [address] Ph: [phone] Fax: [phone]  CC:  Ollen Bowl, MD [address] [place],  [place] [address]  Ollen Bowl, MD

## 2023-04-26 NOTE — Progress Notes (Signed)
Patient cancelled injection

## 2023-04-28 DIAGNOSIS — M6289 Other specified disorders of muscle: Secondary | ICD-10-CM | POA: Diagnosis not present

## 2023-04-28 DIAGNOSIS — F411 Generalized anxiety disorder: Secondary | ICD-10-CM | POA: Diagnosis not present

## 2023-04-28 DIAGNOSIS — G248 Other dystonia: Secondary | ICD-10-CM | POA: Diagnosis not present

## 2023-04-28 DIAGNOSIS — M7121 Synovial cyst of popliteal space [Baker], right knee: Secondary | ICD-10-CM | POA: Diagnosis not present

## 2023-05-24 DIAGNOSIS — M9903 Segmental and somatic dysfunction of lumbar region: Secondary | ICD-10-CM | POA: Diagnosis not present

## 2023-05-24 DIAGNOSIS — M9901 Segmental and somatic dysfunction of cervical region: Secondary | ICD-10-CM | POA: Diagnosis not present

## 2023-05-24 DIAGNOSIS — M5386 Other specified dorsopathies, lumbar region: Secondary | ICD-10-CM | POA: Diagnosis not present

## 2023-05-24 DIAGNOSIS — M62830 Muscle spasm of back: Secondary | ICD-10-CM | POA: Diagnosis not present

## 2023-05-24 DIAGNOSIS — M531 Cervicobrachial syndrome: Secondary | ICD-10-CM | POA: Diagnosis not present

## 2023-05-24 DIAGNOSIS — M9902 Segmental and somatic dysfunction of thoracic region: Secondary | ICD-10-CM | POA: Diagnosis not present

## 2023-05-24 DIAGNOSIS — M9904 Segmental and somatic dysfunction of sacral region: Secondary | ICD-10-CM | POA: Diagnosis not present

## 2023-06-21 DIAGNOSIS — F411 Generalized anxiety disorder: Secondary | ICD-10-CM | POA: Diagnosis not present

## 2023-06-21 DIAGNOSIS — G248 Other dystonia: Secondary | ICD-10-CM | POA: Diagnosis not present

## 2023-06-21 DIAGNOSIS — M6289 Other specified disorders of muscle: Secondary | ICD-10-CM | POA: Diagnosis not present

## 2023-06-21 DIAGNOSIS — F5101 Primary insomnia: Secondary | ICD-10-CM | POA: Diagnosis not present

## 2023-06-21 DIAGNOSIS — M7121 Synovial cyst of popliteal space [Baker], right knee: Secondary | ICD-10-CM | POA: Diagnosis not present

## 2023-06-21 DIAGNOSIS — G2401 Drug induced subacute dyskinesia: Secondary | ICD-10-CM | POA: Diagnosis not present

## 2023-06-29 DIAGNOSIS — R269 Unspecified abnormalities of gait and mobility: Secondary | ICD-10-CM | POA: Diagnosis not present

## 2023-06-29 DIAGNOSIS — F419 Anxiety disorder, unspecified: Secondary | ICD-10-CM | POA: Diagnosis not present

## 2023-06-29 DIAGNOSIS — F444 Conversion disorder with motor symptom or deficit: Secondary | ICD-10-CM | POA: Diagnosis not present

## 2023-06-29 DIAGNOSIS — G2402 Drug induced acute dystonia: Secondary | ICD-10-CM | POA: Diagnosis not present

## 2023-07-05 DIAGNOSIS — M62830 Muscle spasm of back: Secondary | ICD-10-CM | POA: Diagnosis not present

## 2023-07-05 DIAGNOSIS — M9902 Segmental and somatic dysfunction of thoracic region: Secondary | ICD-10-CM | POA: Diagnosis not present

## 2023-07-05 DIAGNOSIS — M531 Cervicobrachial syndrome: Secondary | ICD-10-CM | POA: Diagnosis not present

## 2023-07-05 DIAGNOSIS — M9901 Segmental and somatic dysfunction of cervical region: Secondary | ICD-10-CM | POA: Diagnosis not present

## 2023-07-05 DIAGNOSIS — M5386 Other specified dorsopathies, lumbar region: Secondary | ICD-10-CM | POA: Diagnosis not present

## 2023-07-05 DIAGNOSIS — M9904 Segmental and somatic dysfunction of sacral region: Secondary | ICD-10-CM | POA: Diagnosis not present

## 2023-07-05 DIAGNOSIS — M9903 Segmental and somatic dysfunction of lumbar region: Secondary | ICD-10-CM | POA: Diagnosis not present

## 2023-07-06 DIAGNOSIS — M9904 Segmental and somatic dysfunction of sacral region: Secondary | ICD-10-CM | POA: Diagnosis not present

## 2023-07-06 DIAGNOSIS — M531 Cervicobrachial syndrome: Secondary | ICD-10-CM | POA: Diagnosis not present

## 2023-07-06 DIAGNOSIS — M9901 Segmental and somatic dysfunction of cervical region: Secondary | ICD-10-CM | POA: Diagnosis not present

## 2023-07-06 DIAGNOSIS — M9903 Segmental and somatic dysfunction of lumbar region: Secondary | ICD-10-CM | POA: Diagnosis not present

## 2023-07-06 DIAGNOSIS — M9902 Segmental and somatic dysfunction of thoracic region: Secondary | ICD-10-CM | POA: Diagnosis not present

## 2023-07-06 DIAGNOSIS — M5386 Other specified dorsopathies, lumbar region: Secondary | ICD-10-CM | POA: Diagnosis not present

## 2023-07-06 DIAGNOSIS — M62830 Muscle spasm of back: Secondary | ICD-10-CM | POA: Diagnosis not present

## 2023-07-07 DIAGNOSIS — F444 Conversion disorder with motor symptom or deficit: Secondary | ICD-10-CM | POA: Diagnosis not present

## 2023-07-07 DIAGNOSIS — F5101 Primary insomnia: Secondary | ICD-10-CM | POA: Diagnosis not present

## 2023-07-07 DIAGNOSIS — G248 Other dystonia: Secondary | ICD-10-CM | POA: Diagnosis not present

## 2023-07-07 DIAGNOSIS — F411 Generalized anxiety disorder: Secondary | ICD-10-CM | POA: Diagnosis not present

## 2023-07-14 DIAGNOSIS — M5386 Other specified dorsopathies, lumbar region: Secondary | ICD-10-CM | POA: Diagnosis not present

## 2023-07-14 DIAGNOSIS — M9903 Segmental and somatic dysfunction of lumbar region: Secondary | ICD-10-CM | POA: Diagnosis not present

## 2023-07-14 DIAGNOSIS — M9904 Segmental and somatic dysfunction of sacral region: Secondary | ICD-10-CM | POA: Diagnosis not present

## 2023-07-14 DIAGNOSIS — M9902 Segmental and somatic dysfunction of thoracic region: Secondary | ICD-10-CM | POA: Diagnosis not present

## 2023-07-14 DIAGNOSIS — M531 Cervicobrachial syndrome: Secondary | ICD-10-CM | POA: Diagnosis not present

## 2023-07-14 DIAGNOSIS — M62830 Muscle spasm of back: Secondary | ICD-10-CM | POA: Diagnosis not present

## 2023-07-14 DIAGNOSIS — M9901 Segmental and somatic dysfunction of cervical region: Secondary | ICD-10-CM | POA: Diagnosis not present

## 2023-07-15 ENCOUNTER — Ambulatory Visit: Attending: Neurology | Admitting: Physical Therapy

## 2023-07-15 VITALS — BP 160/94 | HR 118

## 2023-07-15 DIAGNOSIS — R258 Other abnormal involuntary movements: Secondary | ICD-10-CM | POA: Diagnosis not present

## 2023-07-15 DIAGNOSIS — R2681 Unsteadiness on feet: Secondary | ICD-10-CM | POA: Diagnosis not present

## 2023-07-15 DIAGNOSIS — R2689 Other abnormalities of gait and mobility: Secondary | ICD-10-CM | POA: Diagnosis not present

## 2023-07-15 NOTE — Therapy (Signed)
 OUTPATIENT PHYSICAL THERAPY NEURO EVALUATION   Patient Name: Lauren Levy MRN: [id number] DOB:07/16/1970, 53 y.o., female Today's Date: 07/15/2023   PCP: Elester Grim, MD REFERRING PROVIDER: Carlton Chick, MD  END OF SESSION:  PT End of Session - 07/15/23 1111     Visit Number 1    Number of Visits 13   with eval   Date for PT Re-Evaluation 09/09/23    Authorization Type [employer]    PT Start Time 1110   pt arrived late   PT Stop Time 1150    PT Time Calculation (min) 40 min    Equipment Utilized During Treatment Gait belt    Activity Tolerance Patient tolerated treatment well    Behavior During Therapy Restless             Past Medical History:  Diagnosis Date   Ovarian cyst    Past Surgical History:  Procedure Laterality Date   BREAST BIOPSY Right 02/16/2021   papilloma   BREAST LUMPECTOMY Right 04/14/2021   BREAST LUMPECTOMY WITH RADIOACTIVE SEED LOCALIZATION Right 04/14/2021   Procedure: RIGHT BREAST LUMPECTOMY WITH RADIOACTIVE SEED LOCALIZATION;  Surgeon: Sim Dryer, MD;  Location: [facility];  Service: General;  Laterality: Right;   KNEE SURGERY Right    MINOR CARPAL TUNNEL Right    Patient Active Problem List   Diagnosis Date Noted   Anxiety 01/12/2022   Foot pain, right 01/12/2022   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   Dystonia 06/03/2021   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   Gait disturbance 10/09/2020    ONSET DATE: 06/29/2023  REFERRING DIAG: R26.9 (ICD-10-CM) - Unspecified abnormalities of gait and mobility  THERAPY DIAG:  Other abnormalities of gait and mobility  Unsteadiness on feet  Other abnormal involuntary movements  Rationale for Evaluation and Treatment: Rehabilitation  SUBJECTIVE:                                                                                                                                                                                             SUBJECTIVE STATEMENT:  Pt arrives to PT  eval accompanied by her daughter, Lauren Levy.  Pt reports that in 2023 she was given medication that triggered tardive dyskinesia and focal dystonia. She reports that her R foot used to turn in and she would have to walk on her toes because her toes would curl under due to this but she had knee surgery to correct it. She reports that recently though her movements have gotten worse, her whole body will tense up and she is unable to open her eyes and speak at the same time. She reports that  speaking is very difficult, she finds herself holding her breath and feels like there is a weight sitting on her chest.  She also reports getting fatigued very easily, even just walking up the stairs at home to get to her bedroom/bathroom she will find herself out of breath. Additionally, when she wakes up first thing in the the morning she does not have any of these symptoms, they come on gradually throughout the day.  She reports that she started seeing a chiropractor to help straighten out her back but feels like she is worse after working with them, stopped seeing them this week. She feels like her hips are uneven due to walking abnormally for so long and she has increased twisting of her trunk and her hips. She even finds it easier to walk backwards sometimes. She feels more stable when she sticks her backside out during walking, if she stands up straight she tends to rotate more. She also feels that she cannot sit in a chair normally because she will slide out. She has to sit on the edge of the chair and brace herself with her L leg on the floor with her body rotated, has had to stop driving because of this.  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 Penny Boxer) and has some understanding of her diagnosis. She is also scheduled to meet with psychology next week.  She also reports that her blood pressure with shoot "sky high" at times.   Pt accompanied by: self and  family member daughter Lauren Levy  PERTINENT HISTORY: PMH: anxiety, focal dystonia  PAIN:  Are you having pain? No  PRECAUTIONS: None  RED FLAGS: Bowel or bladder incontinence: No   WEIGHT BEARING RESTRICTIONS: No  FALLS: Has patient fallen in last 6 months? No  LIVING ENVIRONMENT: Lives with: lives with their family Lives in: House/apartment Stairs: Yes: Internal: 12 steps; on right going up and External: 3 steps; on right going up Has following equipment at home: Single point cane and knee scooter  PLOF: Independent with gait, Independent with transfers, and Requires assistive device for independence - furniture walks or holds onto family members to walk  PATIENT GOALS: "see if we can help me talk without closing my eyes, strengthen my back so that I can sit like normal people (slides out of chair when sitting "normally"), help with my walking"  OBJECTIVE:  Note: Objective measures were completed at Evaluation unless otherwise noted.  DIAGNOSTIC FINDINGS:  None updated or relevant to this POC  COGNITION: Overall cognitive status: Within functional limits for tasks assessed   SENSATION: Impaired sensation on L lateral thigh - doesn't feel the same on both sides  COORDINATION: Impaired in BLE  EDEMA:  None   POSTURE: trunk extended and rotated, L leg hanging off edge of chair and bracing herself with her foot against the ground  LOWER EXTREMITY ROM:   WFL  Active  Right Eval Left Eval  Hip flexion    Hip extension    Hip abduction    Hip adduction    Hip internal rotation    Hip external rotation    Knee flexion    Knee extension    Ankle dorsiflexion    Ankle plantarflexion    Ankle inversion    Ankle eversion     (Blank rows = not tested)  LOWER EXTREMITY MMT:    MMT Right Eval Left Eval  Hip flexion 5 5  Hip extension  Hip abduction    Hip adduction    Hip internal rotation    Hip external rotation    Knee flexion 5 5  Knee extension 5 5   Ankle dorsiflexion 5 5  Ankle plantarflexion    Ankle inversion    Ankle eversion    (Blank rows = not tested)  BED MOBILITY:  Sleeps on the floor, tenses up and tosses and turns - nothing to brace herself against when she is on the bed (does floor transfers with no issues, uses the bed to help her up)  TRANSFERS: Sit to stand: Modified independence  Assistive device utilized: None     Stand to sit: Modified independence  Assistive device utilized: None     Chair to chair: Modified independence  Assistive device utilized: None       RAMP:  Not tested  CURB:  Not tested  STAIRS: Not tested GAIT: Findings:  Gait pattern: increased trunk rotation, ataxic, trunk rotated posterior- Right, and trunk rotated posterior- Left Distance walked: various clinic distances Assistive device utilized: None Level of assistance: SBA Comments: see above, very abnormal movements   FUNCTIONAL TESTS:    [facility] PT Assessment - 07/15/23 1141       Ambulation/Gait   Gait velocity 32.8 ft over 12.12 sec = 2.7 ft/sec      Standardized Balance Assessment   Standardized Balance Assessment Timed Up and Go Test      Timed Up and Go Test   TUG Normal TUG    Normal TUG (seconds) 12.22   no AD           : 283 ft with 2 standing rest breaks due to R quad soreness, 10/10 RPE                                                                                                                               TREATMENT DATE: PT Evaluation   Self-Care/Home Management Vitals:   07/15/23 1157  BP: (!) 160/94  Pulse: (!) 118   Assessed BP in LUE in sitting mid-way through session. Plan to assess vitals prior to intervention next visit and continue to monitor during visit due to pt reports of history of hypertension. Also discussed PT POC with plan for multi-disciplinary approach (therapy and psychology) to best treat her diagnosis.   PATIENT EDUCATION: Education details: Eval findings, PT POC, see  above Person educated: Patient and Child(ren) Education method: Medical illustrator Education comprehension: verbalized understanding, returned demonstration, and needs further education  HOME EXERCISE PROGRAM: To be initiated  GOALS: Goals reviewed with patient? Yes  SHORT TERM GOALS: Target date: 08/08/2023   Pt will be independent with initial HEP for improved strength, balance, transfers and gait. Baseline: Goal status: INITIAL  2.  Pt will improve gait velocity to at least 3.0 ft/sec for improved gait efficiency and performance at SBA level  Baseline: 2.7 ft/sec SBA no AD (6/6) Goal status: INITIAL  3.  Pt will ambulate greater than or equal to 350 feet on with LRAD and SBA for improved cardiovascular endurance and BLE strength.  Baseline: 283 ft no AD SBA RPE 10/10 (6/6) Goal status: INITIAL    LONG TERM GOALS: Target date: 08/29/2023   Pt will be independent with final HEP for improved strength, balance, transfers and gait. Baseline:  Goal status: INITIAL  2.  Pt will improve gait velocity to at least 3.25 ft/sec for improved gait efficiency and performance at mod I level  Baseline: 2.7 ft/sec SBA no AD (6/6) Goal status: INITIAL  3.  Pt will ambulate greater than or equal to 450 feet on with LRAD and SBA for improved cardiovascular endurance and BLE strength.  Baseline: 283 ft no AD SBA RPE 10/10 (6/6) Goal status: INITIAL  4.  Pt will demonstrate a good understanding of her diagnosis (FND/FMD) and resources available to her. Baseline:  Goal status: INITIAL    ASSESSMENT:  CLINICAL IMPRESSION: Patient is a 53 year old female referred to Neuro OPPT for movement and gait abnormalities secondary to FND/FMD diagnosis.   Pt's PMH is significant for: anxiety and focal dystonia. The following deficits were present during the exam: impaired endurance, decreased UE and LE coordination, impaired motor control, and gait abnormalities as noted above.  Based on her abnormal movements and gait speed of 2.7 ft/sec, pt is an increased risk for falls. Pt would benefit from skilled PT to address these impairments and functional limitations to maximize functional mobility independence.   OBJECTIVE IMPAIRMENTS: Abnormal gait, cardiopulmonary status limiting activity, decreased activity tolerance, decreased balance, decreased coordination, decreased endurance, decreased knowledge of condition, decreased knowledge of use of DME, decreased mobility, difficulty walking, impaired perceived functional ability, impaired sensation, improper body mechanics, and postural dysfunction.   ACTIVITY LIMITATIONS: carrying, lifting, bending, sitting, standing, squatting, sleeping, stairs, transfers, and bed mobility  PARTICIPATION LIMITATIONS: meal prep, cleaning, laundry, interpersonal relationship, driving, shopping, community activity, and occupation  PERSONAL FACTORS: Time since onset of injury/illness/exacerbation and 1-2 comorbidities: anxiety and focal dystonia are also affecting patient's functional outcome.   REHAB POTENTIAL: Good  CLINICAL DECISION MAKING: Evolving/moderate complexity  EVALUATION COMPLEXITY: High  PLAN:  PT FREQUENCY: 2x/week  PT DURATION: 6 weeks  PLANNED INTERVENTIONS: 41324- PT Re-evaluation, 97750- Physical Performance Testing, 97110-Therapeutic exercises, 97530- Therapeutic activity, 97112- Neuromuscular re-education, 97535- Self Care, 40102- Manual therapy, [phone]- Gait training, [phone]- Aquatic Therapy, [phone]- Electrical stimulation (manual), [phone] (1-2 muscles), 20561 (3+ muscles)- Dry Needling, Patient/Family education, Balance training, Stair training, Taping, Joint mobilization, Spinal mobilization, DME instructions, Cryotherapy, and Moist heat  PLAN FOR NEXT SESSION: speech therapy referral needed?, assess BP at baseline and monitor during session, quadruped and tall kneel to work on proximal stability, assess hip abd  strength, work on core strengthening, functional movements   Lorita Rosa, PT Lorita Rosa, PT, DPT, CSRS  07/15/2023, 11:58 AM

## 2023-07-18 ENCOUNTER — Telehealth: Payer: Self-pay | Admitting: Physical Therapy

## 2023-07-18 NOTE — Telephone Encounter (Signed)
 Dr. Lilyan Remedies,  Viviano Ground  was evaluated by PT on 07/15/2023.  The patient would benefit from a Speech Therapy evaluation for difficulties with speech and breathing techniques.   If you agree, please place an order in [facility] workque in [facility] or fax the order to [phone].  Thank you,  Lorita Rosa, PT, DPT, [facility] [address] [place], [place]  [address] Phone:  [phone] Fax:  [phone]

## 2023-07-19 ENCOUNTER — Ambulatory Visit

## 2023-07-19 VITALS — BP 134/97 | HR 108

## 2023-07-19 DIAGNOSIS — R2689 Other abnormalities of gait and mobility: Secondary | ICD-10-CM

## 2023-07-19 DIAGNOSIS — R258 Other abnormal involuntary movements: Secondary | ICD-10-CM

## 2023-07-19 DIAGNOSIS — R2681 Unsteadiness on feet: Secondary | ICD-10-CM

## 2023-07-19 NOTE — Therapy (Signed)
 OUTPATIENT PHYSICAL THERAPY NEURO TREATMENT   Patient Name: Lauren Levy MRN: [id number] DOB:08-18-1970, 53 y.o., female Today's Date: 07/19/2023   PCP: Elester Grim, MD REFERRING PROVIDER: Carlton Chick, MD  END OF SESSION:  PT End of Session - 07/19/23 1450     Visit Number 2    Number of Visits 13    Date for PT Re-Evaluation 09/09/23    Authorization Type A[employer]    PT Start Time 1448   patient late   PT Stop Time 1528    PT Time Calculation (min) 40 min    Equipment Utilized During Treatment Gait belt    Activity Tolerance Patient tolerated treatment well    Behavior During Therapy Anxious;Lability             Past Medical History:  Diagnosis Date   Ovarian cyst    Past Surgical History:  Procedure Laterality Date   BREAST BIOPSY Right 02/16/2021   papilloma   BREAST LUMPECTOMY Right 04/14/2021   BREAST LUMPECTOMY WITH RADIOACTIVE SEED LOCALIZATION Right 04/14/2021   Procedure: RIGHT BREAST LUMPECTOMY WITH RADIOACTIVE SEED LOCALIZATION;  Surgeon: Sim Dryer, MD;  Location: [facility];  Service: General;  Laterality: Right;   KNEE SURGERY Right    MINOR CARPAL TUNNEL Right    Patient Active Problem List   Diagnosis Date Noted   Anxiety 01/12/2022   Foot pain, right 01/12/2022   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   Dystonia 06/03/2021   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 "All the same" since eval. Denies falls. Intermittently holding B eyes open in order to talk to PT. Continues to side sit with L LE hanging off edge of chair    Pt accompanied by: self and family member daughter Lauren Levy  PERTINENT HISTORY: PMH: anxiety, focal dystonia  PAIN:  Are you having pain? No  PRECAUTIONS: None  PATIENT GOALS: "see if we can help me talk without closing my eyes, strengthen my back so that I can sit like normal people (slides out of chair when sitting "normally"), help with my walking"  TREATMENT   Self-Care/Home Management Vitals:   07/19/23 1457 07/19/23 1500  BP: (!) 137/107 (!) 134/97  Pulse: (!) 109 (!) 108  Assessed BP in LUE in sitting  -excessive education on pathophys of FND -deep breathing techniques  -progressive relaxation in supine   NMR: -static sitting EOM slight anterior reach to retrieve and replace squigz on the mirror   -progressed to sitting on dynadisc with ability to maintain appropriate upright posture noted -tall kneeling on mat with B UE support on bench   -progressed to resisted sitting on heels<> tall kneeling  -supine SKTC + DKTC   PATIENT EDUCATION: Education details: see above Person educated: Patient and Child(ren) Education method: Medical illustrator Education comprehension: verbalized understanding, returned demonstration, and needs further education  HOME EXERCISE PROGRAM: -sit at dinning table for 1 meal with upright posture picturing self in mirror   GOALS: Goals reviewed with patient? Yes  SHORT TERM GOALS: Target date: 08/08/2023   Pt will be independent with initial HEP for improved strength, balance, transfers and gait. Baseline: Goal status: INITIAL  2.  Pt will improve gait velocity to at least 3.0 ft/sec for improved gait efficiency and performance at SBA  level  Baseline: 2.7 ft/sec SBA no AD (6/6) Goal status: INITIAL  3.  Pt will ambulate greater than or equal to 350 feet on with LRAD and SBA for improved cardiovascular endurance and BLE strength.  Baseline: 283 ft no AD SBA RPE 10/10 (6/6) Goal status: INITIAL    LONG TERM GOALS: Target date: 08/29/2023   Pt will be independent with final HEP for improved strength, balance, transfers and gait. Baseline:  Goal status: INITIAL  2.  Pt will improve gait velocity to at least 3.25 ft/sec for improved gait efficiency and performance at mod I level  Baseline: 2.7 ft/sec SBA no AD (6/6) Goal status: INITIAL  3.  Pt will ambulate greater than or equal to 450 feet on with LRAD and SBA for improved cardiovascular endurance and BLE strength.  Baseline: 283 ft no AD SBA RPE 10/10 (6/6) Goal status: INITIAL  4.  Pt will demonstrate a good understanding of her diagnosis (FND/FMD) and resources available to her. Baseline:  Goal status: INITIAL    ASSESSMENT:  CLINICAL IMPRESSION: Patient seen for skilled PT session with emphasis on patient education and gross NMR. Appreciative of further explanation of FND and how it has impacted her. Discussed implications of stress/anxiety on flares of her symptoms and how they may manifest. When presented with a sitting surface that encouraged increased movement, patient able to maintain stability, which is a significant improvement from previous constant moving in sitting. Notable carryover throughout session with intermittent verbal cues. Continue POC.    OBJECTIVE IMPAIRMENTS: Abnormal gait, cardiopulmonary status limiting activity, decreased activity tolerance, decreased balance, decreased coordination, decreased endurance, decreased knowledge of condition, decreased knowledge of use of DME, decreased mobility, difficulty walking, impaired perceived functional ability, impaired sensation, improper body mechanics, and postural dysfunction.    ACTIVITY LIMITATIONS: carrying, lifting, bending, sitting, standing, squatting, sleeping, stairs, transfers, and bed mobility  PARTICIPATION LIMITATIONS: meal prep, cleaning, laundry, interpersonal relationship, driving, shopping, community activity, and occupation  PERSONAL FACTORS: Time since onset of injury/illness/exacerbation and 1-2 comorbidities: anxiety and focal dystonia are also affecting patient's functional outcome.   REHAB POTENTIAL: Good  CLINICAL DECISION MAKING: Evolving/moderate complexity  EVALUATION COMPLEXITY: High  PLAN:  PT FREQUENCY: 2x/week  PT DURATION: 6 weeks  PLANNED INTERVENTIONS: 97164- PT Re-evaluation, 97750- Physical Performance  Testing, 97110-Therapeutic exercises, 97530- Therapeutic activity, [id number]- Neuromuscular re-education, [phone]- Self Care, 60454- Manual therapy, [phone]- Gait training, [phone]- Aquatic Therapy, [phone]- Electrical stimulation (manual), [phone] (1-2 muscles), [phone] (3+ muscles)- Dry Needling, Patient/Family education, Balance training, Stair training, Taping, Joint mobilization, Spinal mobilization, DME instructions, Cryotherapy, and Moist heat  PLAN FOR NEXT SESSION: assess BP at baseline and monitor during session, quadruped and tall kneel to work on proximal stability, assess hip abd strength, work on core strengthening, functional movements, resisted gait, blaze pods   Rebecca Campus, PT Rebecca Campus, PT, DPT, CBIS  07/19/2023, 3:40 PM

## 2023-07-22 ENCOUNTER — Ambulatory Visit

## 2023-07-22 DIAGNOSIS — F411 Generalized anxiety disorder: Secondary | ICD-10-CM | POA: Diagnosis not present

## 2023-07-27 ENCOUNTER — Ambulatory Visit: Admitting: Physical Therapy

## 2023-07-27 ENCOUNTER — Ambulatory Visit: Admitting: Neurology

## 2023-07-29 ENCOUNTER — Ambulatory Visit: Admitting: Physical Therapy

## 2023-07-29 VITALS — BP 132/96 | HR 93

## 2023-07-29 DIAGNOSIS — R2689 Other abnormalities of gait and mobility: Secondary | ICD-10-CM | POA: Diagnosis not present

## 2023-07-29 DIAGNOSIS — R258 Other abnormal involuntary movements: Secondary | ICD-10-CM

## 2023-07-29 DIAGNOSIS — R2681 Unsteadiness on feet: Secondary | ICD-10-CM

## 2023-07-29 NOTE — Therapy (Signed)
 OUTPATIENT PHYSICAL THERAPY NEURO TREATMENT   Patient Name: Lauren Levy MRN: [id number] DOB:[birth year], 53 y.o., female Today's Date: 07/29/2023   PCP: Lauren Grim, MD REFERRING PROVIDER: Carlton Chick, MD  END OF SESSION:  PT End of Session - 07/29/23 1153     Visit Number 3    Number of Visits 13    Date for PT Re-Evaluation 09/09/23    Authorization Type Aetna    PT Start Time 1150    PT Stop Time 1230    PT Time Calculation (min) 40 min    Equipment Utilized During Treatment Gait belt    Activity Tolerance Patient tolerated treatment well    Behavior During Therapy Anxious;Lability           Past Medical History:  Diagnosis Date   Ovarian cyst    Past Surgical History:  Procedure Laterality Date   BREAST BIOPSY Right 02/16/2021   papilloma   BREAST LUMPECTOMY Right 04/14/2021   BREAST LUMPECTOMY WITH RADIOACTIVE SEED LOCALIZATION Right 04/14/2021   Procedure: RIGHT BREAST LUMPECTOMY WITH RADIOACTIVE SEED LOCALIZATION;  Surgeon: Lauren Dryer, MD;  [facility];  Service: General;  Laterality: Right;   KNEE SURGERY Right    MINOR CARPAL TUNNEL Right    Patient Active Problem List   Diagnosis Date Noted   Anxiety 01/12/2022   Foot pain, right 01/12/2022   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   Dystonia 06/03/2021   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   Gait disturbance 10/09/2020    ONSET DATE: 06/29/2023  REFERRING DIAG: R26.9 (ICD-10-CM) - Unspecified abnormalities of gait and mobility  THERAPY DIAG:  Other abnormalities of gait and mobility  Unsteadiness on feet  Other abnormal involuntary movements  Rationale for Evaluation and Treatment: Rehabilitation  SUBJECTIVE:                                                                                                                                                                                             SUBJECTIVE STATEMENT:  Pt just got back from vacation in [place] visiting her husband's family. Pt denies any fall or acute changes since last visit. Pt reports she has been working on her walking and her gait pattern is improved walking into therapy session this morning, reverts back to gait witnessed on eval if she tries to talk while walking.  Pt did try working on sitting upright at her dining table, is still struggling with that.  Pt accompanied by: self and family member daughter Lauren Levy (in car)  PERTINENT HISTORY: PMH: anxiety, focal dystonia  PAIN:  Are you having pain? No  PRECAUTIONS: None  PATIENT GOALS: see if we can help me talk without closing my eyes, strengthen my back so that I can sit like normal people (slides out of chair when sitting normally), help with my walking                                                                                                                               TREATMENT   Self-Care/Home Management Vitals:   07/29/23 1252 07/29/23 1253  BP: (!) 138/106 (!) 132/96  Pulse: 96 93   Assessed BP in LUE in sitting prior to intervention:  138/105, HR 105 145/108, HR 102 138/106, HR 96  Above readings obtained 2 min apart. Educated patient that it is unsafe to proceed with PT session if her systolic BP > 180 or diastolic BP > 100, pt agreeable to try relaxation techniques to see if her BP will lower. Pt able to perform verbally-guided progressive relaxation in R sidelying on mat table. Of note, she is unable to relax in supine position as her body continues to move too much. Following progressive relaxation BP again assessed in sitting in LUE:  132/96, HR 93  Final reading obtained following progressive relaxation, within safe limits for participation in PT session.  NMR: To work on dynamic sitting balance, improved visual scanning, and reaching outside BOS: 5 Blaze pods on random setting. Performed on 1 minute intervals with 30 rest periods.  Pt requires SBA guarding. Pt seated on green  dynadisc on EOM while pods placed in semi-circle in front of her on chairs, mat table, and on floor. Round 1:  25 hits. Round 2:  26 hits. Round 3:  29 hits. Notable errors/deficits:  mild sitting balance impairments 5 Blaze pods on random setting. Performed on 1 minute intervals with 30 rest periods.  Pt requires SBA guarding. Pt seated on green dynadisc on EOM while pods placed on mirror in front of table and laterally on mat table with transition to reaching across midline. Round 1:  21 hits. Round 2:  17 hits (pt distracted, talking during this round) Round 3:  25 hits. Notable errors/deficits:  more difficulty remembering to reach across midline and significant difficulty when pt engages in conversation while performing task    PATIENT EDUCATION: Education details: continue working on sitting normally at dining room table, will focus on dual-tasking more in future sessions Person educated: Patient Education method: Medical illustrator Education comprehension: verbalized understanding, returned demonstration, and needs further education  HOME EXERCISE PROGRAM: -sit at dinning table for 1 meal with upright posture picturing self in mirror   GOALS: Goals reviewed with patient? Yes  SHORT TERM GOALS: Target date: 08/08/2023   Pt will be independent with initial HEP for improved strength, balance, transfers and gait. Baseline: Goal status: INITIAL  2.  Pt will improve gait velocity to at least 3.0 ft/sec for improved gait efficiency and performance at SBA level  Baseline: 2.7 ft/sec SBA no AD (  6/6) Goal status: INITIAL  3.  Pt will ambulate greater than or equal to 350 feet on with LRAD and SBA for improved cardiovascular endurance and BLE strength.  Baseline: 283 ft no AD SBA RPE 10/10 (6/6) Goal status: INITIAL    LONG TERM GOALS: Target date: 08/29/2023   Pt will be independent with final HEP for improved strength, balance, transfers and gait. Baseline:   Goal status: INITIAL  2.  Pt will improve gait velocity to at least 3.25 ft/sec for improved gait efficiency and performance at mod I level  Baseline: 2.7 ft/sec SBA no AD (6/6) Goal status: INITIAL  3.  Pt will ambulate greater than or equal to 450 feet on with LRAD and SBA for improved cardiovascular endurance and BLE strength.  Baseline: 283 ft no AD SBA RPE 10/10 (6/6) Goal status: INITIAL  4.  Pt will demonstrate a good understanding of her diagnosis (FND/FMD) and resources available to her. Baseline:  Goal status: INITIAL    ASSESSMENT:  CLINICAL IMPRESSION: Emphasis of skilled PT session on assessing BP, working on progressive relaxation in order to decrease blood pressure, and working on dynamic sitting balance and reaching outside BOS and across midline. Pt initially hypertensive with BP too elevated for safe participation in PT session, following progressive relaxation her BP decreases to within safe limits for participation in PT session. She does well with seated reaching task with improved upright sitting posture noted when distracted by Blaze pods task. She does struggle more with task completion when distracted by conversation and can benefit from more practice with cognitive dual-tasking. Continue POC.    OBJECTIVE IMPAIRMENTS: Abnormal gait, cardiopulmonary status limiting activity, decreased activity tolerance, decreased balance, decreased coordination, decreased endurance, decreased knowledge of condition, decreased knowledge of use of DME, decreased mobility, difficulty walking, impaired perceived functional ability, impaired sensation, improper body mechanics, and postural dysfunction.   ACTIVITY LIMITATIONS: carrying, lifting, bending, sitting, standing, squatting, sleeping, stairs, transfers, and bed mobility  PARTICIPATION LIMITATIONS: meal prep, cleaning, laundry, interpersonal relationship, driving, shopping, community activity, and occupation  PERSONAL  FACTORS: Time since onset of injury/illness/exacerbation and 1-2 comorbidities: anxiety and focal dystonia are also affecting patient's functional outcome.   REHAB POTENTIAL: Good  CLINICAL DECISION MAKING: Evolving/moderate complexity  EVALUATION COMPLEXITY: High  PLAN:  PT FREQUENCY: 2x/week  PT DURATION: 6 weeks  PLANNED INTERVENTIONS: 97164- PT Re-evaluation, 97750- Physical Performance Testing, 97110-Therapeutic exercises, 97530- Therapeutic activity, 97112- Neuromuscular re-education, 97535- Self Care, 62952- Manual therapy, [phone]- Gait training, [phone]- Aquatic Therapy, [phone]- Electrical stimulation (manual), [phone] (1-2 muscles), 20561 (3+ muscles)- Dry Needling, Patient/Family education, Balance training, Stair training, Taping, Joint mobilization, Spinal mobilization, DME instructions, Cryotherapy, and Moist heat  PLAN FOR NEXT SESSION: assess BP, quadruped and tall kneel to work on proximal stability, assess hip abd strength, work on core strengthening, functional movements, resisted gait, blaze pods, cognitive dual tasking   Lorita Rosa, PT Lorita Rosa, PT, DPT, CSRS   07/29/2023, 12:51 PM

## 2023-08-03 ENCOUNTER — Ambulatory Visit: Admitting: Physical Therapy

## 2023-08-03 VITALS — BP 133/98 | HR 108

## 2023-08-03 DIAGNOSIS — R258 Other abnormal involuntary movements: Secondary | ICD-10-CM

## 2023-08-03 DIAGNOSIS — R2681 Unsteadiness on feet: Secondary | ICD-10-CM | POA: Diagnosis not present

## 2023-08-03 DIAGNOSIS — R2689 Other abnormalities of gait and mobility: Secondary | ICD-10-CM

## 2023-08-03 NOTE — Therapy (Signed)
 OUTPATIENT PHYSICAL THERAPY NEURO TREATMENT   Patient Name: Lauren Levy MRN: [id number] DOB:1970-12-27, 53 y.o., female Today's Date: 08/03/2023   PCP: Vernon Velna SAUNDERS, MD REFERRING PROVIDER: Hans Cinderella Duck, MD  END OF SESSION:  PT End of Session - 08/03/23 1107     Visit Number 4    Number of Visits 13    Date for PT Re-Evaluation 09/09/23    Authorization Type Aet[employer]    PT Start Time 1105   pt arrived late   PT Stop Time 1143    PT Time Calculation (min) 38 min    Equipment Utilized During Treatment Gait belt    Activity Tolerance Patient tolerated treatment well    Behavior During Therapy Anxious;Lability            Past Medical History:  Diagnosis Date   Ovarian cyst    Past Surgical History:  Procedure Laterality Date   BREAST BIOPSY Right 02/16/2021   papilloma   BREAST LUMPECTOMY Right 04/14/2021   BREAST LUMPECTOMY WITH RADIOACTIVE SEED LOCALIZATION Right 04/14/2021   Procedure: RIGHT BREAST LUMPECTOMY WITH RADIOACTIVE SEED LOCALIZATION;  Surgeon: Vanderbilt Ned, MD;  Location: [facility];  Service: General;  Laterality: Right;   KNEE SURGERY Right    MINOR CARPAL TUNNEL Right    Patient Active Problem List   Diagnosis Date Noted   Anxiety 01/12/2022   Foot pain, right 01/12/2022   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   Dystonia 06/03/2021   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   Gait disturbance 10/09/2020    ONSET DATE: 06/29/2023  REFERRING DIAG: R26.9 (ICD-10-CM) - Unspecified abnormalities of gait and mobility  THERAPY DIAG:  Other abnormalities of gait and mobility  Unsteadiness on feet  Other abnormal involuntary movements  Rationale for Evaluation and Treatment: Rehabilitation  SUBJECTIVE:                                                                                                                                                                                             SUBJECTIVE STATEMENT:  Pt reports she did  not sleep well last night, was tossing and turning, feeling tired today. Pt felt good after last session of PT. Pt reports she is still struggling with her walking and with trying to sit up straight. If she is well-rested she feels like she walks and moves better.  Starts her talk therapy next week, has her initial assessment.  Pt accompanied by: self and family member daughter [name] (in car)  PERTINENT HISTORY: PMH: anxiety, focal dystonia  PAIN:  Are you having pain? No  PRECAUTIONS: None  PATIENT GOALS: see if we can  help me talk without closing my eyes, strengthen my back so that I can sit like normal people (slides out of chair when sitting normally), help with my walking                                                                                                                               TREATMENT   Self-Care/Home Management Vitals:   08/03/23 1133  BP: (!) 133/98  Pulse: (!) 108   Assessed in sitting in LUE. BP is elevated but improved as compared to initial readings last session. Pt able to provide her reading from home this morning:  125/90 Per patient report she does not take any medication for hypertension. Will continue to monitor.   NMR: To work on proximal hip strengthening with dual-tasking: Tall kneel on red mat on floor: Performing alt L/R reaching across midline and outside BOS for cones Added in dual task of naming foods, difficulty speaking while performing a physical task Half kneel on red mat on floor: 6# weighted ball chest press x 10 reps L/R 2# weighted dowel chest press x 10 reps L/R 2# weighted dowel volleyball x 15 reps L/R  To work on postural control and normalizing movements: Sit to stand 2 x 10 reps in front of mirror Cues to keep eyes open as able Encouraged patient to work on this at home, she declines a handout    PATIENT EDUCATION: Education details: continue working on sitting normally at dining room table and sit to  stands in front of mirror Person educated: Patient Education method: Medical illustrator Education comprehension: verbalized understanding, returned demonstration, and needs further education  HOME EXERCISE PROGRAM: -sit at [employer] table for 1 meal with upright posture picturing self in mirror  -work on sit to stands in front of full-length mirror at home, keep eyes open  GOALS: Goals reviewed with patient? Yes  SHORT TERM GOALS: Target date: 08/08/2023   Pt will be independent with initial HEP for improved strength, balance, transfers and gait. Baseline: Goal status: INITIAL  2.  Pt will improve gait velocity to at least 3.0 ft/sec for improved gait efficiency and performance at SBA level  Baseline: 2.7 ft/sec SBA no AD (6/6) Goal status: INITIAL  3.  Pt will ambulate greater than or equal to 350 feet on with LRAD and SBA for improved cardiovascular endurance and BLE strength.  Baseline: 283 ft no AD SBA RPE 10/10 (6/6) Goal status: INITIAL    LONG TERM GOALS: Target date: 08/29/2023   Pt will be independent with final HEP for improved strength, balance, transfers and gait. Baseline:  Goal status: INITIAL  2.  Pt will improve gait velocity to at least 3.25 ft/sec for improved gait efficiency and performance at mod I level  Baseline: 2.7 ft/sec SBA no AD (6/6) Goal status: INITIAL  3.  Pt will ambulate greater than or equal to 450 feet on with LRAD and SBA  for improved cardiovascular endurance and BLE strength.  Baseline: 283 ft no AD SBA RPE 10/10 (6/6) Goal status: INITIAL  4.  Pt will demonstrate a good understanding of her diagnosis (FND/FMD) and resources available to her. Baseline:  Goal status: INITIAL    ASSESSMENT:  CLINICAL IMPRESSION: Emphasis of skilled PT session on continue to assess BP, working on proximal hip strengthening, working on dual-tasking, and working on functional movements and normalizing movement patterns. Pt's BP  remains elevated though it is improved as compared to last session. She continues to struggle with dual-tasking, particularly when she has to speak while doing a physical task. She continues to benefit from skilled PT services to work on functional strengthening and returning to more normal movement patterns. Continue POC.    OBJECTIVE IMPAIRMENTS: Abnormal gait, cardiopulmonary status limiting activity, decreased activity tolerance, decreased balance, decreased coordination, decreased endurance, decreased knowledge of condition, decreased knowledge of use of DME, decreased mobility, difficulty walking, impaired perceived functional ability, impaired sensation, improper body mechanics, and postural dysfunction.   ACTIVITY LIMITATIONS: carrying, lifting, bending, sitting, standing, squatting, sleeping, stairs, transfers, and bed mobility  PARTICIPATION LIMITATIONS: meal prep, cleaning, laundry, interpersonal relationship, driving, shopping, community activity, and occupation  PERSONAL FACTORS: Time since onset of injury/illness/exacerbation and 1-2 comorbidities: anxiety and focal dystonia are also affecting patient's functional outcome.   REHAB POTENTIAL: Good  CLINICAL DECISION MAKING: Evolving/moderate complexity  EVALUATION COMPLEXITY: High  PLAN:  PT FREQUENCY: 2x/week  PT DURATION: 6 weeks  PLANNED INTERVENTIONS: 02835- PT Re-evaluation, 97750- Physical Performance Testing, 97110-Therapeutic exercises, 97530- Therapeutic activity, 97112- Neuromuscular re-education, 97535- Self Care, 02859- Manual therapy, [phone]- Gait training, [phone]- Aquatic Therapy, [phone]- Electrical stimulation (manual), [phone] (1-2 muscles), 20561 (3+ muscles)- Dry Needling, Patient/Family education, Balance training, Stair training, Taping, Joint mobilization, Spinal mobilization, DME instructions, Cryotherapy, and Moist heat  PLAN FOR NEXT SESSION: assess BP, quadruped and tall kneel to work on proximal stability,  assess hip abd strength, work on core strengthening, functional movements, resisted gait, blaze pods, cognitive dual tasking (particularly having to speak while doing a physical task)   Waddell Southgate, PT Waddell Southgate, PT, DPT, CSRS   08/03/2023, 11:44 AM

## 2023-08-05 ENCOUNTER — Ambulatory Visit

## 2023-08-05 VITALS — BP 117/91 | HR 109

## 2023-08-05 DIAGNOSIS — R2681 Unsteadiness on feet: Secondary | ICD-10-CM

## 2023-08-05 DIAGNOSIS — F411 Generalized anxiety disorder: Secondary | ICD-10-CM | POA: Diagnosis not present

## 2023-08-05 DIAGNOSIS — R258 Other abnormal involuntary movements: Secondary | ICD-10-CM

## 2023-08-05 DIAGNOSIS — R2689 Other abnormalities of gait and mobility: Secondary | ICD-10-CM | POA: Diagnosis not present

## 2023-08-05 NOTE — Therapy (Signed)
 OUTPATIENT PHYSICAL THERAPY NEURO TREATMENT   Patient Name: Lauren Levy MRN: [id number] DOB:[birth year], 53 y.o., female Today's Date: 08/05/2023   PCP: Vernon Velna SAUNDERS, MD REFERRING PROVIDER: Hans Cinderella Duck, MD  END OF SESSION:  PT End of Session - 08/05/23 1147     Visit Number 5    Number of Visits 13    Date for PT Re-Evaluation 09/09/23    Authorization Type Aet[employer]    PT Start Time 1145    PT Stop Time 1230    PT Time Calculation (min) 45 min    Equipment Utilized During Treatment Gait belt    Activity Tolerance Patient tolerated treatment well    Behavior During Therapy Anxious;Lability            Past Medical History:  Diagnosis Date   Ovarian cyst    Past Surgical History:  Procedure Laterality Date   BREAST BIOPSY Right 02/16/2021   papilloma   BREAST LUMPECTOMY Right 04/14/2021   BREAST LUMPECTOMY WITH RADIOACTIVE SEED LOCALIZATION Right 04/14/2021   Procedure: RIGHT BREAST LUMPECTOMY WITH RADIOACTIVE SEED LOCALIZATION;  Surgeon: Vanderbilt Ned, MD;  Location: [facility];  Service: General;  Laterality: Right;   KNEE SURGERY Right    MINOR CARPAL TUNNEL Right    Patient Active Problem List   Diagnosis Date Noted   Anxiety 01/12/2022   Foot pain, right 01/12/2022   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   Dystonia 06/03/2021   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   Gait disturbance 10/09/2020    ONSET DATE: 06/29/2023  REFERRING DIAG: R26.9 (ICD-10-CM) - Unspecified abnormalities of gait and mobility  THERAPY DIAG:  Other abnormalities of gait and mobility  Unsteadiness on feet  Other abnormal involuntary movements  Rationale for Evaluation and Treatment: Rehabilitation  SUBJECTIVE:                                                                                                                                                                                             SUBJECTIVE STATEMENT:  Pt reports she did not sleep well last  night, was tossing and turning, feeling tired today. Pt felt good after last session of PT. Pt reports she is still struggling with her walking and with trying to sit up straight. If she is well-rested she feels like she walks and moves better.  Starts her talk therapy next week, has her initial assessment.  Pt accompanied by: self and family member daughter [name] (in car)  PERTINENT HISTORY: PMH: anxiety, focal dystonia  PAIN:  Are you having pain? No  PRECAUTIONS: None  PATIENT GOALS: see if we can help me talk without  closing my eyes, strengthen my back so that I can sit like normal people (slides out of chair when sitting normally), help with my walking                                                                                                                               TREATMENT   Self-Care/Home Management Vitals:   08/05/23 1224 08/05/23 1226  BP: (!) 157/107 (!) 117/91  Pulse: (!) 112 (!) 109     NMR: -quadruped blaze pods   -mat table-> airex-> dynadisc  - sit <> stand + slam ball toss x10 -active recovery in tripod rest position to encourage B feet flat on floor   -when talking to PT has to resort to modified half-kneel position while seated -modified SLS with slam ball roll out U UE support + PT minimizing compensatory pelvic rotation  -noted patients eyes closed throughout movement   -progressed to patient reading out letters of Shiela Chart (2 lines each LE) -standing ball toss to self   -progressed to 115' ambulatory ball toss to self  -BP after: 174/105, HR: 119  PATIENT EDUCATION: Education details: continue working on sitting normally at dining room table and sit to stands in front of mirror Person educated: Patient Education method: Medical illustrator Education comprehension: verbalized understanding, returned demonstration, and needs further education  HOME EXERCISE PROGRAM: -sit at dinning table for 1 meal with upright posture  picturing self in mirror  -work on sit to stands in front of full-length mirror at home, keep eyes open  GOALS: Goals reviewed with patient? Yes  SHORT TERM GOALS: Target date: 08/08/2023   Pt will be independent with initial HEP for improved strength, balance, transfers and gait. Baseline: Goal status: INITIAL  2.  Pt will improve gait velocity to at least 3.0 ft/sec for improved gait efficiency and performance at SBA level  Baseline: 2.7 ft/sec SBA no AD (6/6) Goal status: INITIAL  3.  Pt will ambulate greater than or equal to 350 feet on with LRAD and SBA for improved cardiovascular endurance and BLE strength.  Baseline: 283 ft no AD SBA RPE 10/10 (6/6) Goal status: INITIAL    LONG TERM GOALS: Target date: 08/29/2023   Pt will be independent with final HEP for improved strength, balance, transfers and gait. Baseline:  Goal status: INITIAL  2.  Pt will improve gait velocity to at least 3.25 ft/sec for improved gait efficiency and performance at mod I level  Baseline: 2.7 ft/sec SBA no AD (6/6) Goal status: INITIAL  3.  Pt will ambulate greater than or equal to 450 feet on with LRAD and SBA for improved cardiovascular endurance and BLE strength.  Baseline: 283 ft no AD SBA RPE 10/10 (6/6) Goal status: INITIAL  4.  Pt will demonstrate a good understanding of her diagnosis (FND/FMD) and resources available to her. Baseline:  Goal status: INITIAL    ASSESSMENT:  CLINICAL IMPRESSION:  Patient seen for skilled PT session with emphasis on gross NMR. Continues to demonstrate difficulty with very simple dual tasking, such as maintaining eye contact while speaking. She also reports difficulty coordinating her breath to be able to talk at appropriate projection and carry on a conversation. She demonstrates appropriate balance with complex SLS tasks and appropriate core strength as noted in quadruped on compliant surface. Gait pattern normalized when given dual motor task,  such as ball toss. PT noted no carryover to normalized gait pattern upon exiting clinic. Continue POC.    OBJECTIVE IMPAIRMENTS: Abnormal gait, cardiopulmonary status limiting activity, decreased activity tolerance, decreased balance, decreased coordination, decreased endurance, decreased knowledge of condition, decreased knowledge of use of DME, decreased mobility, difficulty walking, impaired perceived functional ability, impaired sensation, improper body mechanics, and postural dysfunction.   ACTIVITY LIMITATIONS: carrying, lifting, bending, sitting, standing, squatting, sleeping, stairs, transfers, and bed mobility  PARTICIPATION LIMITATIONS: meal prep, cleaning, laundry, interpersonal relationship, driving, shopping, community activity, and occupation  PERSONAL FACTORS: Time since onset of injury/illness/exacerbation and 1-2 comorbidities: anxiety and focal dystonia are also affecting patient's functional outcome.   REHAB POTENTIAL: Good  CLINICAL DECISION MAKING: Evolving/moderate complexity  EVALUATION COMPLEXITY: High  PLAN:  PT FREQUENCY: 2x/week  PT DURATION: 6 weeks  PLANNED INTERVENTIONS: 02835- PT Re-evaluation, 97750- Physical Performance Testing, 97110-Therapeutic exercises, 97530- Therapeutic activity, 97112- Neuromuscular re-education, 97535- Self Care, 02859- Manual therapy, [phone]- Gait training, [phone]- Aquatic Therapy, [phone]- Electrical stimulation (manual), [phone] (1-2 muscles), [phone] (3+ muscles)- Dry Needling, Patient/Family education, Balance training, Stair training, Taping, Joint mobilization, Spinal mobilization, DME instructions, Cryotherapy, and Moist heat  PLAN FOR NEXT SESSION: assess BP, quadruped and tall kneel to work on proximal stability, assess hip abd strength, work on core strengthening, functional movements, resisted gait, blaze pods, cognitive dual tasking (particularly having to speak while doing a physical task)   Delon DELENA Pop, PT Delon DELENA Pop, PT, DPT, CBIS   08/05/2023, 12:37 PM

## 2023-08-08 ENCOUNTER — Telehealth: Payer: Self-pay | Admitting: Physical Therapy

## 2023-08-08 ENCOUNTER — Ambulatory Visit

## 2023-08-08 ENCOUNTER — Ambulatory Visit: Admitting: Physical Therapy

## 2023-08-08 VITALS — BP 146/96 | HR 107

## 2023-08-08 DIAGNOSIS — R2681 Unsteadiness on feet: Secondary | ICD-10-CM

## 2023-08-08 DIAGNOSIS — R258 Other abnormal involuntary movements: Secondary | ICD-10-CM | POA: Diagnosis not present

## 2023-08-08 DIAGNOSIS — R2689 Other abnormalities of gait and mobility: Secondary | ICD-10-CM | POA: Diagnosis not present

## 2023-08-08 NOTE — Therapy (Signed)
 OUTPATIENT PHYSICAL THERAPY NEURO TREATMENT   Patient Name: Lauren Levy MRN: [id number] DOB:11/26/1970, 53 y.o., female Today's Date: 08/08/2023   PCP: Vernon Velna SAUNDERS, MD REFERRING PROVIDER: Hans Cinderella Duck, MD  END OF SESSION:  PT End of Session - 08/08/23 1536     Visit Number 6    Number of Visits 13    Date for PT Re-Evaluation 09/09/23    Authorization Type Aetna    PT Start Time 1535    PT Stop Time 1614    PT Time Calculation (min) 39 min    Equipment Utilized During Treatment Gait belt    Activity Tolerance Patient tolerated treatment well    Behavior During Therapy Anxious;Lability             Past Medical History:  Diagnosis Date   Ovarian cyst    Past Surgical History:  Procedure Laterality Date   BREAST BIOPSY Right 02/16/2021   papilloma   BREAST LUMPECTOMY Right 04/14/2021   BREAST LUMPECTOMY WITH RADIOACTIVE SEED LOCALIZATION Right 04/14/2021   Procedure: RIGHT BREAST LUMPECTOMY WITH RADIOACTIVE SEED LOCALIZATION;  Surgeon: Vanderbilt Ned, MD;  Location: [facility];  Service: General;  Laterality: Right;   KNEE SURGERY Right    MINOR CARPAL TUNNEL Right    Patient Active Problem List   Diagnosis Date Noted   Anxiety 01/12/2022   Foot pain, right 01/12/2022   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   Dystonia 06/03/2021   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   Gait disturbance 10/09/2020    ONSET DATE: 06/29/2023  REFERRING DIAG: R26.9 (ICD-10-CM) - Unspecified abnormalities of gait and mobility  THERAPY DIAG:  Other abnormalities of gait and mobility  Unsteadiness on feet  Other abnormal involuntary movements  Rationale for Evaluation and Treatment: Rehabilitation  SUBJECTIVE:                                                                                                                                                                                             SUBJECTIVE STATEMENT:  Pt's walking looks more normal  walking into her appointment today. Pt reports she continues to struggle to sit upright in a chair, has been trying to work on it at home but she fatigues very quickly and ends up slouching. Pt has been working on sit to stands at home, able to keep her eyes open. She really just struggles with multi-tasking while she is speaking.  Pt has had one session of her talk therapy/counseling so far, has another one this week.  Pt accompanied by: self and family member daughter Lauren Levy (in car)  PERTINENT HISTORY: PMH: anxiety, focal dystonia  PAIN:  Are you having pain? No  PRECAUTIONS: None  PATIENT GOALS: see if we can help me talk without closing my eyes, strengthen my back so that I can sit like normal people (slides out of chair when sitting normally), help with my walking                                                                                                                               TREATMENT   Self-Care/Home Management Vitals:   08/08/23 1542  BP: (!) 146/96  Pulse: (!) 107   Assessed in LUE while seated at rest prior to intervention. BP elevated but within safe limits for participation in session.  Physical Performance For STG assessment:  [facility] PT Assessment - 08/08/23 1544       Ambulation/Gait   Gait velocity 32.8 ft over 9.75 sec = 3.36 ft/sec         : 366 ft, one standing rest break, 10/10 RPE Pt feels like she is fighting her muscles when walking  TherEx To address complaints of tightness in her quad muscles: Supine modified Thomas stretch with strap around foot for quad stretch Pt uncomfortable in this position, feels like her hips keep moving and she is sliding off the table Transitioned into sidelying with strap around foot for quad stretch, 3 x 30 sec each B   NMR To work on functional mobility and dual-tasking Gait 4 x 25 ft with ball toss to self, focus on keeping eyes open Added in counting up by 1's to task 4 x 25 ft, initial  round pt exhibits increase in gait deviations including hip rotation but this improves with further repetitions  PATIENT EDUCATION: Education details: continue working on sitting normally at dining room table and sit to stands in front of mirror, added in quad stretch to HEP; results of OM assessment and functional implications Person educated: Patient Education method: Medical illustrator Education comprehension: verbalized understanding, returned demonstration, and needs further education  HOME EXERCISE PROGRAM: -sit at dinning table for 1 meal with upright posture picturing self in mirror  Start with trying to sit upright x 15 sec without changing position, add 15 sec as this gets easier -work on sit to stands in front of full-length mirror at home, keep eyes open Add in counting up by 1's as this gets easier  -sidelying quad stretch 3 x 30 sec each B (verbally added 6/30, pt declines handout)  GOALS: Goals reviewed with patient? Yes  SHORT TERM GOALS: Target date: 08/08/2023  Pt will be independent with initial HEP for improved strength, balance, transfers and gait. Baseline: Goal status: MET  2.  Pt will improve gait velocity to at least 3.0 ft/sec for improved gait efficiency and performance at SBA level  Baseline: 2.7 ft/sec SBA no AD (6/6), 3.36 ft/sec mod I no AD (6/30) Goal status: MET  3.  Pt will ambulate greater than or equal to 350 feet  on with LRAD and SBA for improved cardiovascular endurance and BLE strength.  Baseline: 283 ft no AD SBA RPE 10/10 (6/6), 366 ft no AD mod I RPE 10/10 (6/30) Goal status: MET    LONG TERM GOALS: Target date: 08/29/2023   Pt will be independent with final HEP for improved strength, balance, transfers and gait. Baseline:  Goal status: INITIAL  2.  Pt will improve gait velocity to at least 3.5 ft/sec for improved gait efficiency and performance at mod I level  Baseline: 2.7 ft/sec SBA no AD (6/6), 3.36 ft/sec mod I no  AD (6/30) Goal status: REVISED/UPGRADED  3.  Pt will ambulate greater than or equal to 450 feet on with LRAD and SBA for improved cardiovascular endurance and BLE strength.  Baseline: 283 ft no AD SBA RPE 10/10 (6/6), 366 ft no AD mod I RPE 10/10 (6/30) Goal status: INITIAL  4.  Pt will demonstrate a good understanding of her diagnosis (FND/FMD) and resources available to her. Baseline:  Goal status: INITIAL    ASSESSMENT:  CLINICAL IMPRESSION: Emphasis of skilled PT session on continuing to assess BP, assessing STG, and continuing to work on improving her functional mobility. Her resting BP and heart rate remain elevated but they are within safe limits for participation in PT session this date. She has met 3/3 STG due to being independent with her initial HEP, improving her gait speed from 2.7 ft/sec on eval to 3.36 ft/sec this date, and improving her distance on the from 283 ft to 366 ft this date with decreased rest breaks needed. However, pt continues to be very fatigued at end of with 10/10 RPE, reports this is due to having to fight her muscles when she is walking to get her legs to go where she wants them to go. Pt with good carryover of normalized gait pattern with all walking tasks during session except for when speaking adding in during gait, however with increased repetitions she does exhibit improved return to a normal gait pattern with speaking. She continues to benefit from skilled PT services to work on increasing her safety with functional mobility and returning to PLOF. Continue POC.    OBJECTIVE IMPAIRMENTS: Abnormal gait, cardiopulmonary status limiting activity, decreased activity tolerance, decreased balance, decreased coordination, decreased endurance, decreased knowledge of condition, decreased knowledge of use of DME, decreased mobility, difficulty walking, impaired perceived functional ability, impaired sensation, improper body mechanics, and postural  dysfunction.   ACTIVITY LIMITATIONS: carrying, lifting, bending, sitting, standing, squatting, sleeping, stairs, transfers, and bed mobility  PARTICIPATION LIMITATIONS: meal prep, cleaning, laundry, interpersonal relationship, driving, shopping, community activity, and occupation  PERSONAL FACTORS: Time since onset of injury/illness/exacerbation and 1-2 comorbidities: anxiety and focal dystonia are also affecting patient's functional outcome.   REHAB POTENTIAL: Good  CLINICAL DECISION MAKING: Evolving/moderate complexity  EVALUATION COMPLEXITY: High  PLAN:  PT FREQUENCY: 2x/week  PT DURATION: 6 weeks  PLANNED INTERVENTIONS: 02835- PT Re-evaluation, 97750- Physical Performance Testing, 97110-Therapeutic exercises, 97530- Therapeutic activity, 97112- Neuromuscular re-education, 97535- Self Care, 02859- Manual therapy, [phone]- Gait training, [phone]- Aquatic Therapy, [phone]- Electrical stimulation (manual), [phone] (1-2 muscles), 20561 (3+ muscles)- Dry Needling, Patient/Family education, Balance training, Stair training, Taping, Joint mobilization, Spinal mobilization, DME instructions, Cryotherapy, and Moist heat  PLAN FOR NEXT SESSION: assess BP, quadruped and tall kneel to work on proximal stability, assess hip abd strength, work on core strengthening, functional movements, resisted gait, blaze pods, cognitive dual tasking (particularly having to speak while doing a physical  task), Yoga for functional stretching- warrior pose?   Aaleeyah Bias, PT Waddell [place], PT, DPT, CSRS    08/08/2023, 4:14 PM

## 2023-08-08 NOTE — Telephone Encounter (Signed)
 Dr. Lilyan Remedies,  Lauren Levy  was evaluated by PT on 07/15/2023.  The patient would benefit from a Speech Therapy evaluation for difficulties with speech and breathing techniques.   If you agree, please place an order in [facility] workque in [facility] or fax the order to [phone].  Thank you,  Lorita Rosa, PT, DPT, [facility] [address] [place], [place]  [address] Phone:  [phone] Fax:  [phone]

## 2023-08-09 ENCOUNTER — Encounter

## 2023-08-09 NOTE — Therapy (Deleted)
 OUTPATIENT SPEECH LANGUAGE PATHOLOGY EVALUATION   Patient Name: Lauren Levy MRN: [id number] DOB:01-Dec-1970, 53 y.o., female Today's Date: 08/09/2023  PCP: Vernon Velna SAUNDERS, MD REFERRING PROVIDER: Vernon Velna SAUNDERS, MD  END OF SESSION:   Past Medical History:  Diagnosis Date   Ovarian cyst    Past Surgical History:  Procedure Laterality Date   BREAST BIOPSY Right 02/16/2021   papilloma   BREAST LUMPECTOMY Right 04/14/2021   BREAST LUMPECTOMY WITH RADIOACTIVE SEED LOCALIZATION Right 04/14/2021   Procedure: RIGHT BREAST LUMPECTOMY WITH RADIOACTIVE SEED LOCALIZATION;  Surgeon: Vanderbilt Ned, MD;  Location: [facility];  Service: General;  Laterality: Right;   KNEE SURGERY Right    MINOR CARPAL TUNNEL Right    Patient Active Problem List   Diagnosis Date Noted   Anxiety 01/12/2022   Foot pain, right 01/12/2022   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  Rationale for Evaluation and Treatment: Rehabilitation  SUBJECTIVE:   SUBJECTIVE STATEMENT: *** Pt accompanied by: {accompnied:27141}  PERTINENT HISTORY: ***  PAIN:  Are you having pain? {OPRCPAIN:27236}  FALLS: Has patient fallen in last 6 months?  {DOEQJOOD:74320}  LIVING ENVIRONMENT: Lives with: {[facility] lives with:25569::lives with their family} Lives in: {Lives in:[address]70}  PLOF:  Level of assistance: {DOEEONQ:74326} Employment: {SLPemployment:25674}  PATIENT GOALS: ***  OBJECTIVE:  Note: Objective measures were completed at Evaluation unless otherwise noted.  DIAGNOSTIC FINDINGS: ***  COGNITION: Overall cognitive status: {cognition:24006} Areas of impairment:  {cognitiveimpairmentslp:27409} Functional deficits: ***  AUDITORY  COMPREHENSION: Overall auditory comprehension: {IMPAIRED:25374} YES/NO questions: {IMPAIRED:25374} Following directions: {IMPAIRED:25374} Conversation: {SLP conversation:25430} Interfering components: {SLP interfering components:25431} Effective technique: {SLP effective technique:25432}  READING COMPREHENSION: {SLPreadingcomprehension:27140}  EXPRESSION: {SLP EXPRESSION:25433}  VERBAL EXPRESSION: Level of generative/spontaneous verbalization: {SLP level of generative/spontaneious verbalization:25435} Automatic speech: {SLP ATOMIC SPEECH:25434}  Repetition: {SLPrepetion:27212} Naming: {SLPnaming:27214} Pragmatics: {slppragmatics:27216} Comments: *** Interfering components: {SLP INTERFERING COMPONENTS:25436} Effective technique: {SLP EFFECTIVE TECHNIQUE:25437} Non-verbal means of communication: {SLP non verbal means of communication:25438}  WRITTEN EXPRESSION: Dominant hand: {RIGHT/LEFT:20294} Written expression: {slpwrittenexp:27209}  MOTOR SPEECH: Overall motor speech: {slpimpaired:27210} Level of impairment: {SLP level of impairment:25441} Respiration: {respbreathing:27195} Phonation: {SLP phonation:25439} Resonance: {SLP resonance:25440} Articulation: {SLParticulation:27218} Intelligibility: {SLP Intelligible:25442} Motor planning: {slpmotorspeecherrors:27220} Motor speech errors: {SLP motor speech errors:25443} Interfering components: {SLP Interfering components (MS):25444} Effective technique: {SLP effective technique (MS):25445}  ORAL MOTOR EXAMINATION: Overall status: {OMESLP2:27645} Comments: ***  RECOMMENDATIONS FROM OBJECTIVE SWALLOW STUDY (MBSS/FEES):  *** Objective swallow impairments: *** Objective recommended compensations: ***  CLINICAL SWALLOW ASSESSMENT:   Current diet: {slpdiet:27196} Dentition: {dentition:27197} Patient directly observed with POs: {POobserved:27199} Feeding: {slp feeding:27200} Liquids provided by: {SLPliquids:27201} Oral phase  signs and symptoms: {SLPoralphase:27202} Pharyngeal phase signs and symptoms: {SLPpharyngealphase:27203} Comments: ***  STANDARDIZED ASSESSMENTS: {SLPstandardizedassessment:27092}  PATIENT REPORTED OUTCOME MEASURES (PROM): {SLPPROM:27095}  TREATMENT DATE: ***   PATIENT EDUCATION: Education details: *** Person educated: {Person educated:25204} Education method: {Education Method:25205} Education comprehension: {Education Comprehension:25206}   GOALS: Goals reviewed with patient? Yes  SHORT TERM GOALS: Target date: 09/06/2023  *** Baseline: Goal status: INITIAL  2.  *** Baseline:  Goal status: INITIAL  3.  *** Baseline:  Goal status: INITIAL  4.  *** Baseline:  Goal status: INITIAL  5.  *** Baseline:  Goal status: INITIAL  6.  *** Baseline:  Goal status: INITIAL  LONG TERM GOALS: Target date: ***  *** Baseline:  Goal status: INITIAL  2.  *** Baseline:  Goal status: INITIAL  3.  *** Baseline:  Goal status: INITIAL  4.  *** Baseline:  Goal status: INITIAL  5.  *** Baseline:  Goal status: INITIAL  6.  *** Baseline:  Goal status: INITIAL  ASSESSMENT:  CLINICAL IMPRESSION: Patient is a 53 y.o. F who was seen today for ST evaluation for FND/conversion disorder.   OBJECTIVE IMPAIRMENTS: include {SLPOBJIMP:27107}. These impairments are limiting patient from {SLPLIMIT:27108}. Factors affecting potential to achieve goals and functional outcome are {SLP factors:25450}. Patient will benefit from skilled SLP services to address above impairments and improve overall function.  REHAB POTENTIAL: {rehabpotential:25112}  PLAN:  SLP FREQUENCY: {rehab frequency:25116}  SLP DURATION: {rehab duration:25117}  PLANNED INTERVENTIONS: {SLP treatment/interventions:25449}    Comer LILLETTE Louder, CCC-SLP 08/09/2023, 9:55  AM

## 2023-08-10 ENCOUNTER — Encounter: Admitting: Speech Pathology

## 2023-08-10 ENCOUNTER — Ambulatory Visit

## 2023-08-11 DIAGNOSIS — F411 Generalized anxiety disorder: Secondary | ICD-10-CM | POA: Diagnosis not present

## 2023-08-17 ENCOUNTER — Encounter: Payer: Self-pay | Admitting: Speech Pathology

## 2023-08-17 ENCOUNTER — Ambulatory Visit: Attending: Neurology | Admitting: Physical Therapy

## 2023-08-17 ENCOUNTER — Ambulatory Visit: Admitting: Speech Pathology

## 2023-08-17 VITALS — BP 144/94 | HR 105

## 2023-08-17 DIAGNOSIS — R2689 Other abnormalities of gait and mobility: Secondary | ICD-10-CM | POA: Insufficient documentation

## 2023-08-17 DIAGNOSIS — R4789 Other speech disturbances: Secondary | ICD-10-CM | POA: Insufficient documentation

## 2023-08-17 DIAGNOSIS — R2681 Unsteadiness on feet: Secondary | ICD-10-CM | POA: Insufficient documentation

## 2023-08-17 DIAGNOSIS — R258 Other abnormal involuntary movements: Secondary | ICD-10-CM | POA: Diagnosis not present

## 2023-08-17 NOTE — Therapy (Signed)
 OUTPATIENT SPEECH LANGUAGE PATHOLOGY VOICE/SPEECH EVALUATION   Patient Name: Lauren Levy MRN: [id number] DOB:05-27-1970, 53 y.o., female Today's Date: 08/17/2023  PCP: Vernon Velna SAUNDERS, MD REFERRING PROVIDER: Vernon Velna SAUNDERS, MD  END OF SESSION:  End of Session - 08/17/23 1421     Visit Number 1    Number of Visits 10    Date for SLP Re-Evaluation 10/26/23    SLP Start Time 1230    SLP Stop Time  1315    SLP Time Calculation (min) 45 min    Activity Tolerance Patient tolerated treatment well          Past Medical History:  Diagnosis Date   Ovarian cyst    Past Surgical History:  Procedure Laterality Date   BREAST BIOPSY Right 02/16/2021   papilloma   BREAST LUMPECTOMY Right 04/14/2021   BREAST LUMPECTOMY WITH RADIOACTIVE SEED LOCALIZATION Right 04/14/2021   Procedure: RIGHT BREAST LUMPECTOMY WITH RADIOACTIVE SEED LOCALIZATION;  Surgeon: Vanderbilt Ned, MD;  Location: [facility];  Service: General;  Laterality: Right;   KNEE SURGERY Right    MINOR CARPAL TUNNEL Right    Patient Active Problem List   Diagnosis Date Noted   Anxiety 01/12/2022   Foot pain, right 01/12/2022   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 She reports that her R foot used to turn in and she would have to walk on her toes because her toes would curl under due to this but she had knee surgery to correct it.  She reports that recently though her movements have gotten worse, her whole body will tense up and she is unable to open her eyes and speak at the same time. She reports that speaking is very difficult, she finds herself holding her breath and feels like there is a weight sitting on her chest.   She also reports getting fatigued very easily, even just walking up the stairs at home to get to her bedroom/bathroom she will find herself out of breath. Additionally, when she wakes up first thing in the the morning she does not have any of these symptoms, they come on gradually throughout the day. Consult with movement disorder specialist was negative.   PAIN:  Are you having pain? No  FALLS: Has patient fallen in last 6 months? No, Number of falls: See PT eval  LIVING ENVIRONMENT: Lives with: lives with their family and lives with an adult companion Lives in: House/apartment  PLOF:Level of assistance: Independent with ADLs, Independent with IADLs Employment: Part-time employment  PATIENT GOALS: To speak with ease  OBJECTIVE:  Note: Objective measures were completed at Evaluation unless otherwise noted.  DIAGNOSTIC FINDINGS: 2023 MRI noirmal  COGNITION: Overall cognitive status: Within functional limits for tasks assessed  SOCIAL HISTORY: Occupation: realtor part time Water intake: optimal Caffeine/alcohol intake: minimal Daily voice use: moderate  PERCEPTUAL VOICE ASSESSMENT: Voice quality: harsh, strained, and vocal fatigue Vocal abuse: n/a Resonance: normal Respiratory function: thoracic  breathing and clavicular breathing  OBJECTIVE VOICE ASSESSMENT: Maximum phonation time for sustained ah: 5.14 Conversational loudness average: 73 dB Conversational loudness range: 72-74 dB S/z ratio: 1.09 (Suggestive of dysfunction >1.4)  PATIENT REPORTED OUTCOME MEASURES (PROM): Complete next sessoin                                                                                                                             TREATMENT DATE:   Complete PROM  08/17/23: Evaluation completed - Initiated HEP for muscle tension dysphonia - Trained in gargle 2 minutes prior to HEP. She completed Semi-occluded Vocal Tract Exercises (SOVTE) in water with straw - 10 second hum completed 7x with occasional min verbal cues and modeling. Pitch glides and accents completed with verbal cues and modeling. Song with usual mod A - Instructed her to watch clock and time 10 seconds with hum to successfully facilitate eyes open with phonation. Education re: tension in throat results in pressed voice.     PATIENT EDUCATION: Education details: HEP, pressed v s flow voice, mindfulness Person educated: Patient Education method: Explanation, Demonstration, and Verbal cues Education comprehension: verbalized understanding, returned demonstration, verbal cues required, and needs further education  HOME EXERCISE PROGRAM: Gargle, SOVTE in water  GOALS: Goals reviewed with patient? Yes  SHORT TERM GOALS: Target date: 09/20/13  Pt will complete HEP with rare min A Baseline: Goal status: INITIAL  2.  Pt will achieve clear phonation in structured speech exercises (resonant and flow) Baseline:  Goal status: INITIAL  3.  Pt will speak with eyes open in structured speech tasks Baseline:  Goal status: INITIAL  4.  Pt will ID 2 triggers for tension dysphonia Baseline:  Goal status: INITIAL  5.  Pt will maintain clear phonation for 3 short turns in conversation with occasional min A Baseline:  Goal status: INITIAL    LONG TERM GOALS: Target date: 10/26/23  Pt will complete HEP for speech/voice with mod I Baseline:  Goal status: INITIAL  2.  Pt will maintain clear phonation and eyes open over 5 minute simple converseation Baseline:  Goal status: INITIAL  3.  Pt will eliminate extraneous oral, neck and body movements during 5 minute conversation Baseline:  Goal status: INITIAL  4.  Pt  will improve score on PROM Baseline:  Goal status: INITIAL   ASSESSMENT:  CLINICAL IMPRESSION: Patient is a 53 y.o. 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 She presents with excess tensio and extraneous movements of body while speaking. She reports she is unable to keep her eyes open when she talks. She is able to speak over the phone better because she keeps her eyes closed and doesn't feel pressure to use eye contact. 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 Dyskinesias of face and body noted throughout evaluation. She was able to keep eyes open with phonation in HEP training. Speech dysfluent apporximately 25% of utterances with aytpical stutter  and short 1-2 second blocks. Voice is strained with intermittent breaks/aphonia. I recommend skilled ST to maximize voice quality, intelligibility for safety, success at work and return to [facility]. SABRA   OBJECTIVE IMPAIRMENTS: include voice disorder and speech disorder. These impairments are limiting patient from return to work and effectively communicating at home and in community. Factors affecting potential to achieve goals and functional outcome are co-morbidities.. Patient will benefit from skilled SLP services to address above impairments and improve overall function.  REHAB POTENTIAL: Good  PLAN:  SLP FREQUENCY: 1-2x/week  SLP DURATION: 10 weeks  PLANNED INTERVENTIONS: Environmental controls, Cueing hierachy, Cognitive reorganization, Internal/external aids, Functional tasks, Multimodal communication approach, SLP instruction and feedback, Compensatory strategies, Patient/family education, [phone] Treatment of speech (30 or 45 min) , and [id number]- Speech Eval Sound Prod, Articulate, Phonological    Margene Cherian, Leita Caldron, CCC-SLP 08/17/2023, 2:22 PM

## 2023-08-17 NOTE — Patient Instructions (Addendum)
  [email or web address] - functional neuro disorder  Gargle 2 minutes with plain water  Breathe 2 Relax app twice a day  Straw in water with lid  10 relaxed bubble hums for 10 seconds  10 pitch glides up then down  10 sirens (sets of 4)  [employer] - happy birthday 3x, Kathryne Fellows 3x, you are my sunshine 3x

## 2023-08-17 NOTE — Therapy (Signed)
 OUTPATIENT PHYSICAL THERAPY NEURO TREATMENT   Patient Name: Lauren Levy MRN: [id number] DOB:04-Mar-1970, 53 y.o., female Today's Date: 08/17/2023   PCP: Vernon Velna SAUNDERS, MD REFERRING PROVIDER: Hans Cinderella Duck, MD  END OF SESSION:  PT End of Session - 08/17/23 1149     Visit Number 7    Number of Visits 13    Date for PT Re-Evaluation 09/09/23    Authorization Type [employer]    PT Start Time 1148   pt arrived late   PT Stop Time 1230    PT Time Calculation (min) 42 min    Equipment Utilized During Treatment Gait belt    Activity Tolerance Other (comment)   increase in dyskinesias as side effect of medication   Behavior During Therapy Anxious;Lability              Past Medical History:  Diagnosis Date   Ovarian cyst    Past Surgical History:  Procedure Laterality Date   BREAST BIOPSY Right 02/16/2021   papilloma   BREAST LUMPECTOMY Right 04/14/2021   BREAST LUMPECTOMY WITH RADIOACTIVE SEED LOCALIZATION Right 04/14/2021   Procedure: RIGHT BREAST LUMPECTOMY WITH RADIOACTIVE SEED LOCALIZATION;  Surgeon: Vanderbilt Ned, MD;  Location: [facility];  Service: General;  Laterality: Right;   KNEE SURGERY Right    MINOR CARPAL TUNNEL Right    Patient Active Problem List   Diagnosis Date Noted   Anxiety 01/12/2022   Foot pain, right 01/12/2022   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   Dystonia 06/03/2021   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   Gait disturbance 10/09/2020    ONSET DATE: 06/29/2023  REFERRING DIAG: R26.9 (ICD-10-CM) - Unspecified abnormalities of gait and mobility  THERAPY DIAG:  Other abnormalities of gait and mobility  Unsteadiness on feet  Other abnormal involuntary movements  Rationale for Evaluation and Treatment: Rehabilitation  SUBJECTIVE:                                                                                                                                                                                              SUBJECTIVE STATEMENT:  Pt reports that things are not going as well today, she started taking Ingrezza on 7/1 for her dyskinesias, however she feels like the medication has worsened her dyskinesias, she feels very hypersensitive to touch, she feels like her senses are heightened, her limbs are numb and tingly, like she is more clumsy and feels like she might fall, and has an impending sense of doom or like something bad is going to happen. Pt reports no falls since last visit. She plans to call her neurologist after her therapy appointments  today to ask about discontinuing this medication.  Pt feels like her body tenses up and she is unable to fight it or relax it.  She reports that mental health counseling is going well.  Pt accompanied by: self and family member daughter [name] (in car)  PERTINENT HISTORY: PMH: anxiety, focal dystonia  PAIN:  Are you having pain? No  PRECAUTIONS: None  PATIENT GOALS: see if we can help me talk without closing my eyes, strengthen my back so that I can sit like normal people (slides out of chair when sitting normally), help with my walking                                                                                                                               TREATMENT   Self-Care/Home Management Vitals:   08/17/23 1354 08/17/23 1355  BP: (!) 157/102 (!) 144/94  Pulse:  (!) 105   Assessed in LUE while seated at rest prior to intervention. BP initially too elevated for participation in session. Pt able to perform deep breathing techniques in sidelying on mat table in low-light room. Reassessed BP following relaxation, second reading obtained. Second reading still elevated but within range for participation in therapy session.  Pt very tearful and frustrated this date due to new and worsened symptoms she is experiencing as a side effect of her new medication. Provided therapeutic listening and emotional support to patient, encouraged her to  reach out to her provider after therapies about side effects she is experiencing from her medication.  NMR To work on muscle activation followed by muscle relaxation to see if this gives patient better control over her muscles: Supine on mat table performing isometric therex: Quad set/glute set x 10 reps with 5 sec hold Focus on fully relaxing all muscles at end of each repetition Bridges x 10 reps with 5 sec hold  Seated in chair performing isometric therex: Hip add squeeze x 10 reps with 5 sec hold  Added to HEP, see bolded below    PATIENT EDUCATION: Education details: continue HEP and added to HEP Person educated: Patient Education method: Programmer, multimedia, Demonstration, Verbal cues, and Handouts Education comprehension: verbalized understanding, returned demonstration, and needs further education  HOME EXERCISE PROGRAM: -sit at dinning table for 1 meal with upright posture picturing self in mirror  Start with trying to sit upright x 15 sec without changing position, add 15 sec as this gets easier -work on sit to stands in front of full-length mirror at home, keep eyes open Add in counting up by 1's as this gets easier  -sidelying quad stretch 3 x 30 sec each B (verbally added 6/30, pt declines handout)  Access Code: [email or web address] URL: [email or web address] Date: 08/17/2023 Prepared by: Waddell Southgate  Exercises - Supine Bridge  - 1 x daily - 7 x weekly - 3 sets - 10 reps - Supine Quadricep Sets  - 1 x daily - 7 x weekly - 3 sets -  10 reps - Seated Hip Adduction Isometrics with Ball  - 1 x daily - 7 x weekly - 3 sets - 10 reps - 5 sec hold  GOALS: Goals reviewed with patient? Yes  SHORT TERM GOALS: Target date: 08/08/2023  Pt will be independent with initial HEP for improved strength, balance, transfers and gait. Baseline: Goal status: MET  2.  Pt will improve gait velocity to at least 3.0 ft/sec for improved gait efficiency and performance at SBA level   Baseline: 2.7 ft/sec SBA no AD (6/6), 3.36 ft/sec mod I no AD (6/30) Goal status: MET  3.  Pt will ambulate greater than or equal to 350 feet on with LRAD and SBA for improved cardiovascular endurance and BLE strength.  Baseline: 283 ft no AD SBA RPE 10/10 (6/6), 366 ft no AD mod I RPE 10/10 (6/30) Goal status: MET    LONG TERM GOALS: Target date: 08/29/2023   Pt will be independent with final HEP for improved strength, balance, transfers and gait. Baseline:  Goal status: INITIAL  2.  Pt will improve gait velocity to at least 3.5 ft/sec for improved gait efficiency and performance at mod I level  Baseline: 2.7 ft/sec SBA no AD (6/6), 3.36 ft/sec mod I no AD (6/30) Goal status: REVISED/UPGRADED  3.  Pt will ambulate greater than or equal to 450 feet on with LRAD and SBA for improved cardiovascular endurance and BLE strength.  Baseline: 283 ft no AD SBA RPE 10/10 (6/6), 366 ft no AD mod I RPE 10/10 (6/30) Goal status: INITIAL  4.  Pt will demonstrate a good understanding of her diagnosis (FND/FMD) and resources available to her. Baseline:  Goal status: INITIAL    ASSESSMENT:  CLINICAL IMPRESSION: Emphasis of skilled PT session on continuing to assess BP, working on increasing muscle activation with isometric exercises followed by muscle relaxation in attempts to give patient better control over her muscles, and providing therapeutic listening due to increased frustration this date with increase in dyskinesias. Pt continues to exhibit elevated BP readings in clinic setting though at home she is able to obtain normal readings. She continues to benefit from skilled PT services to work on increasing her safety with functional mobility and returning to PLOF. Continue POC.    OBJECTIVE IMPAIRMENTS: Abnormal gait, cardiopulmonary status limiting activity, decreased activity tolerance, decreased balance, decreased coordination, decreased endurance, decreased knowledge of  condition, decreased knowledge of use of DME, decreased mobility, difficulty walking, impaired perceived functional ability, impaired sensation, improper body mechanics, and postural dysfunction.   ACTIVITY LIMITATIONS: carrying, lifting, bending, sitting, standing, squatting, sleeping, stairs, transfers, and bed mobility  PARTICIPATION LIMITATIONS: meal prep, cleaning, laundry, interpersonal relationship, driving, shopping, community activity, and occupation  PERSONAL FACTORS: Time since onset of injury/illness/exacerbation and 1-2 comorbidities: anxiety and focal dystonia are also affecting patient's functional outcome.   REHAB POTENTIAL: Good  CLINICAL DECISION MAKING: Evolving/moderate complexity  EVALUATION COMPLEXITY: High  PLAN:  PT FREQUENCY: 2x/week  PT DURATION: 6 weeks  PLANNED INTERVENTIONS: 97164- PT Re-evaluation, 97750- Physical Performance Testing, 97110-Therapeutic exercises, 97530- Therapeutic activity, [id number]- Neuromuscular re-education, 97535- Self Care, [id number]- Manual therapy, [id number]- Gait training, [phone]- Aquatic Therapy, [phone]- Electrical stimulation (manual), [phone] (1-2 muscles), [id number] (3+ muscles)- Dry Needling, Patient/Family education, Balance training, Stair training, Taping, Joint mobilization, Spinal mobilization, DME instructions, Cryotherapy, and Moist heat  PLAN FOR NEXT SESSION: assess BP, quadruped and tall kneel to work on proximal stability, assess hip abd strength, work on core strengthening, functional movements, resisted gait,  blaze pods, cognitive dual tasking (particularly having to speak while doing a physical task), Yoga for functional stretching- warrior pose?, did she call her neurologist about stopping new medication?   Del Overfelt, PT Waddell Southgate, PT, DPT, CSRS    08/17/2023, 1:55 PM

## 2023-08-18 DIAGNOSIS — F411 Generalized anxiety disorder: Secondary | ICD-10-CM | POA: Diagnosis not present

## 2023-08-19 ENCOUNTER — Ambulatory Visit: Admitting: Physical Therapy

## 2023-08-19 VITALS — BP 126/93 | HR 93

## 2023-08-19 DIAGNOSIS — R258 Other abnormal involuntary movements: Secondary | ICD-10-CM

## 2023-08-19 DIAGNOSIS — R2681 Unsteadiness on feet: Secondary | ICD-10-CM | POA: Diagnosis not present

## 2023-08-19 DIAGNOSIS — R4789 Other speech disturbances: Secondary | ICD-10-CM | POA: Diagnosis not present

## 2023-08-19 DIAGNOSIS — R2689 Other abnormalities of gait and mobility: Secondary | ICD-10-CM | POA: Diagnosis not present

## 2023-08-19 NOTE — Therapy (Signed)
 OUTPATIENT PHYSICAL THERAPY NEURO TREATMENT   Patient Name: Lauren Levy MRN: [id number] DOB:[birth year], 53 y.o., female Today's Date: 08/19/2023   PCP: Vernon Velna SAUNDERS, MD REFERRING PROVIDER: Hans Cinderella Duck, MD  END OF SESSION:  PT End of Session - 08/19/23 1148     Visit Number 8    Number of Visits 13    Date for PT Re-Evaluation 09/09/23    Authorization Type [employer]    PT Start Time 1145    PT Stop Time 1225    PT Time Calculation (min) 40 min    Equipment Utilized During Treatment Gait belt    Activity Tolerance Other (comment)   increase in dyskinesias as side effect of medication   Behavior During Therapy Anxious;Lability               Past Medical History:  Diagnosis Date   Ovarian cyst    Past Surgical History:  Procedure Laterality Date   BREAST BIOPSY Right 02/16/2021   papilloma   BREAST LUMPECTOMY Right 04/14/2021   BREAST LUMPECTOMY WITH RADIOACTIVE SEED LOCALIZATION Right 04/14/2021   Procedure: RIGHT BREAST LUMPECTOMY WITH RADIOACTIVE SEED LOCALIZATION;  Surgeon: Vanderbilt Ned, MD;  Location: [facility];  Service: General;  Laterality: Right;   KNEE SURGERY Right    MINOR CARPAL TUNNEL Right    Patient Active Problem List   Diagnosis Date Noted   Anxiety 01/12/2022   Foot pain, right 01/12/2022   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   Dystonia 06/03/2021   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   Gait disturbance 10/09/2020    ONSET DATE: 06/29/2023  REFERRING DIAG: R26.9 (ICD-10-CM) - Unspecified abnormalities of gait and mobility  THERAPY DIAG:  Other abnormalities of gait and mobility  Unsteadiness on feet  Other abnormal involuntary movements  Rationale for Evaluation and Treatment: Rehabilitation  SUBJECTIVE:                                                                                                                                                                                             SUBJECTIVE  STATEMENT:  Pt heard back from her MD yesterday and was told to stop taking the dyskinesia movement medication since it was worsening her symptoms. She was told it can take 1-2 weeks to get out of her system.  Pt reports things went well with Speech therapy, the exercises made her laugh but she is working hard on exercises at home.   Pt accompanied by: self and family member daughter [name] (in car)  PERTINENT HISTORY: PMH: anxiety, focal dystonia  PAIN:  Are you having pain? No  PRECAUTIONS: None  PATIENT GOALS:  see if we can help me talk without closing my eyes, strengthen my back so that I can sit like normal people (slides out of chair when sitting normally), help with my walking                                                                                                                               TREATMENT   Self-Care/Home Management Vitals:   08/19/23 1257 08/19/23 1258  BP: (!) 149/106 (!) 126/93  Pulse: (!) 103 93    Assessed in LUE while seated at rest prior to intervention. BP initially too elevated for participation in session. Pt able to perform deep breathing techniques in sidelying on mat table and she was taken through progressive relaxation. Reassessed BP following relaxation, second reading obtained. Second reading within range for safe participation in therapy session.  NMR To work on core strengthening and stabilization: In quadruped on mat table: TA contract x 10 reps with 5 sec hold With alt UE lifts x 10 reps B, tiring With alt LE lifts x 10 reps B, tiring Child's pose 3 x 30 sec  To work on functional mobility and dual-tasking with focus of keeping eyes open with physical activity: Gait 2 x 25 ft with ball toss to self Gait 2 x 115 ft around track of gym with ball toss to self More fatiguing with increased challenge of turns and obstacle navigation in therapy gym    PATIENT EDUCATION: Education details: continue HEP Person educated:  Patient Education method: Medical illustrator Education comprehension: verbalized understanding, returned demonstration, and needs further education  HOME EXERCISE PROGRAM: -sit at dinning table for 1 meal with upright posture picturing self in mirror  Start with trying to sit upright x 15 sec without changing position, add 15 sec as this gets easier -work on sit to stands in front of full-length mirror at home, keep eyes open Add in counting up by 1's as this gets easier  -sidelying quad stretch 3 x 30 sec each B (verbally added 6/30, pt declines handout)  Access Code: [email or web address] URL: [email or web address] Date: 08/17/2023 Prepared by: Waddell Southgate  Exercises - Supine Bridge  - 1 x daily - 7 x weekly - 3 sets - 10 reps - Supine Quadricep Sets  - 1 x daily - 7 x weekly - 3 sets - 10 reps - Seated Hip Adduction Isometrics with Ball  - 1 x daily - 7 x weekly - 3 sets - 10 reps - 5 sec hold  GOALS: Goals reviewed with patient? Yes  SHORT TERM GOALS: Target date: 08/08/2023  Pt will be independent with initial HEP for improved strength, balance, transfers and gait. Baseline: Goal status: MET  2.  Pt will improve gait velocity to at least 3.0 ft/sec for improved gait efficiency and performance at SBA level  Baseline: 2.7 ft/sec SBA no AD (6/6), 3.36 ft/sec mod I no AD (6/30) Goal status: MET  3.  Pt will ambulate greater than or equal to 350 feet on with LRAD and SBA for improved cardiovascular endurance and BLE strength.  Baseline: 283 ft no AD SBA RPE 10/10 (6/6), 366 ft no AD mod I RPE 10/10 (6/30) Goal status: MET    LONG TERM GOALS: Target date: 08/29/2023   Pt will be independent with final HEP for improved strength, balance, transfers and gait. Baseline:  Goal status: INITIAL  2.  Pt will improve gait velocity to at least 3.5 ft/sec for improved gait efficiency and performance at mod I level  Baseline: 2.7 ft/sec SBA no AD (6/6), 3.36  ft/sec mod I no AD (6/30) Goal status: REVISED/UPGRADED  3.  Pt will ambulate greater than or equal to 450 feet on with LRAD and SBA for improved cardiovascular endurance and BLE strength.  Baseline: 283 ft no AD SBA RPE 10/10 (6/6), 366 ft no AD mod I RPE 10/10 (6/30) Goal status: INITIAL  4.  Pt will demonstrate a good understanding of her diagnosis (FND/FMD) and resources available to her. Baseline:  Goal status: INITIAL    ASSESSMENT:  CLINICAL IMPRESSION: Emphasis of skilled PT session on continuing to assess BP, working on core activation in quadruped position, and working on dual-tasking with focus on keeping eyes open while performing physical activities. She fatigues very quickly with dual tasking and needs frequent rest breaks. Pt continues to exhibit elevated BP readings in clinic setting though at home she is able to obtain normal readings with extensive portion of session focused on relaxation and lowering her BP levels to a safe range for participation. She continues to benefit from skilled PT services to work on increasing her safety with functional mobility and returning to PLOF. Continue POC.    OBJECTIVE IMPAIRMENTS: Abnormal gait, cardiopulmonary status limiting activity, decreased activity tolerance, decreased balance, decreased coordination, decreased endurance, decreased knowledge of condition, decreased knowledge of use of DME, decreased mobility, difficulty walking, impaired perceived functional ability, impaired sensation, improper body mechanics, and postural dysfunction.   ACTIVITY LIMITATIONS: carrying, lifting, bending, sitting, standing, squatting, sleeping, stairs, transfers, and bed mobility  PARTICIPATION LIMITATIONS: meal prep, cleaning, laundry, interpersonal relationship, driving, shopping, community activity, and occupation  PERSONAL FACTORS: Time since onset of injury/illness/exacerbation and 1-2 comorbidities: anxiety and focal dystonia are also  affecting patient's functional outcome.   REHAB POTENTIAL: Good  CLINICAL DECISION MAKING: Evolving/moderate complexity  EVALUATION COMPLEXITY: High  PLAN:  PT FREQUENCY: 2x/week  PT DURATION: 6 weeks  PLANNED INTERVENTIONS: 02835- PT Re-evaluation, 97750- Physical Performance Testing, 97110-Therapeutic exercises, 97530- Therapeutic activity, 97112- Neuromuscular re-education, 97535- Self Care, 02859- Manual therapy, [phone]- Gait training, [phone]- Aquatic Therapy, [phone]- Electrical stimulation (manual), [phone] (1-2 muscles), [phone] (3+ muscles)- Dry Needling, Patient/Family education, Balance training, Stair training, Taping, Joint mobilization, Spinal mobilization, DME instructions, Cryotherapy, and Moist heat  PLAN FOR NEXT SESSION: assess BP, quadruped and tall kneel to work on proximal stability, assess hip abd strength, work on core strengthening, functional movements, resisted gait, blaze pods, cognitive dual tasking (particularly having to speak while doing a physical task), Yoga for functional stretching- warrior pose?   Waddell Southgate, PT Waddell Southgate, PT, DPT, CSRS    08/19/2023, 12:57 PM

## 2023-08-24 ENCOUNTER — Ambulatory Visit: Admitting: Speech Pathology

## 2023-08-24 ENCOUNTER — Encounter: Payer: Self-pay | Admitting: Speech Pathology

## 2023-08-24 DIAGNOSIS — R2689 Other abnormalities of gait and mobility: Secondary | ICD-10-CM | POA: Diagnosis not present

## 2023-08-24 DIAGNOSIS — R2681 Unsteadiness on feet: Secondary | ICD-10-CM | POA: Diagnosis not present

## 2023-08-24 DIAGNOSIS — R4789 Other speech disturbances: Secondary | ICD-10-CM

## 2023-08-24 DIAGNOSIS — R258 Other abnormal involuntary movements: Secondary | ICD-10-CM | POA: Diagnosis not present

## 2023-08-24 NOTE — Therapy (Signed)
 OUTPATIENT SPEECH LANGUAGE PATHOLOGY VOICE/SPEECH EVALUATION   Patient Name: Lauren Levy MRN: [id number] DOB:30-Mar-1970, 53 y.o., female Today's Date: 08/24/2023  PCP: Lauren Velna SAUNDERS, MD REFERRING PROVIDER: Vernon Velna SAUNDERS, MD  END OF SESSION:  End of Session - 08/24/23 1446     Visit Number 2    Number of Visits 10    Date for SLP Re-Evaluation 10/26/23    SLP Start Time 1445    SLP Stop Time  1530    SLP Time Calculation (min) 45 min    Activity Tolerance Patient tolerated treatment well          Past Medical History:  Diagnosis Date   Ovarian cyst    Past Surgical History:  Procedure Laterality Date   BREAST BIOPSY Right 02/16/2021   papilloma   BREAST LUMPECTOMY Right 04/14/2021   BREAST LUMPECTOMY WITH RADIOACTIVE SEED LOCALIZATION Right 04/14/2021   Procedure: RIGHT BREAST LUMPECTOMY WITH RADIOACTIVE SEED LOCALIZATION;  Surgeon: Lauren Ned, MD;  Location: [facility];  Service: General;  Laterality: Right;   KNEE SURGERY Right    MINOR CARPAL TUNNEL Right    Patient Active Problem List   Diagnosis Date Noted   Anxiety 01/12/2022   Foot pain, right 01/12/2022   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 She reports that her R foot used to turn in and she would have to walk on her toes because her toes would curl under due to this but she had knee surgery to correct it. She reports that recently though her  movements have gotten worse, her whole body will tense up and she is unable to open her eyes and speak at the same time. She reports that speaking is very difficult, she finds herself holding her breath and feels like there is a weight sitting on her chest.   She also reports getting fatigued very easily, even just walking up the stairs at home to get to her bedroom/bathroom she will find herself out of breath. Additionally, when she wakes up first thing in the the morning she does not have any of these symptoms, they come on gradually throughout the day. Consult with movement disorder specialist was negative.   PAIN:  Are you having pain? No  FALLS: Has patient fallen in last 6 months? No, Number of falls: See PT eval  LIVING ENVIRONMENT: Lives with: lives with their family and lives with an adult companion Lives in: House/apartment  PLOF:Level of assistance: Independent with ADLs, Independent with IADLs Employment: Part-time employment  PATIENT GOALS: To speak with ease  OBJECTIVE:  Note: Objective measures were completed at Evaluation unless otherwise noted.  DIAGNOSTIC FINDINGS: 2023 MRI noirmal  COGNITION: Overall cognitive status: Within functional limits for tasks assessed  SOCIAL HISTORY: Occupation: realtor part time Water intake: optimal Caffeine/alcohol intake: minimal Daily voice use: moderate  PERCEPTUAL VOICE ASSESSMENT: Voice quality: harsh, strained, and vocal fatigue Vocal abuse: n/a Resonance: normal Respiratory function: thoracic breathing and clavicular breathing  OBJECTIVE VOICE  ASSESSMENT: Maximum phonation time for sustained ah: 5.14 Conversational loudness average: 73 dB Conversational loudness range: 72-74 dB S/z ratio: 1.09 (Suggestive of dysfunction >1.4)  PATIENT REPORTED OUTCOME MEASURES (PROM): VRQOL 32 Rated a 5 or as bad as it can be running out of air when talking, being less outgoing due to voice. A 4 , or a lot of problem speaking  loudly, doing her profession because of her voice and avoiding going out socially because of voice                                                                                                                            TREATMENT DATE:   08/24/23: Completed PROM - see above. [name] reports success with SOVTE. Completed parts of SOVTE with instruction to watch the clock to time 10 seconds in duration to train eyes open with phonation. Cues to complete accents, 1 per second watching the clock for eyes open. Initiated flow phonation stretch syllables watching amplitude of tissue displacement with airflow during phonation with syllables who, shoe, sue, phrases - she required occasional cues to maintain eyes open for visual feed back of tissue. Flow phrases focusing on  gently exaggerating flow phonemes to reduce tense pressed voice with rare min A and visual cues of flow sounds highlighted. After exercises, [name] conversed with eyes open 70% of utterances discussing her family and talking while showing me texts on her phone with success. Add stretch flow phonation with tissue to HEP.   08/17/23: Evaluation completed - Initiated HEP for muscle tension dysphonia - Trained in gargle 2 minutes prior to HEP. She completed Semi-occluded Vocal Tract Exercises (SOVTE) in water with straw - 10 second hum completed 7x with occasional min verbal cues and modeling. Pitch glides and accents completed with verbal cues and modeling. Song with usual mod A - Instructed her to watch clock and time 10 seconds with hum to successfully facilitate eyes open with phonation. Education re: tension in throat results in pressed voice.     PATIENT EDUCATION: Education details: HEP, pressed v s flow voice, mindfulness Person educated: Patient Education method: Explanation, Demonstration, and Verbal cues Education comprehension: verbalized understanding, returned demonstration, verbal cues required, and needs further  education  HOME EXERCISE PROGRAM: Gargle, SOVTE in water  GOALS: Goals reviewed with patient? Yes  SHORT TERM GOALS: Target date: 09/20/13  Pt will complete HEP with rare min A Baseline: Goal status: ONGOING  2.  Pt will achieve clear phonation in structured speech exercises (resonant and flow) Baseline:  Goal status: ONGOING  3.  Pt will speak with eyes open in structured speech tasks Baseline:  Goal status: ONGOING  4.  Pt will ID 2 triggers for tension dysphonia Baseline:  Goal status: ONGOING  5.  Pt will maintain clear phonation for 3 short turns in conversation with occasional min A Baseline:  Goal status: ONGOING    LONG TERM GOALS: Target date: 10/26/23  Pt will complete HEP for  speech/voice with mod I Baseline:  Goal status: ONGOING  2.  Pt will maintain clear phonation and eyes open over 5 minute simple converseation Baseline:  Goal status: ONGOING  3.  Pt will eliminate extraneous oral, neck and body movements during 5 minute conversation Baseline:  Goal status: ONGOING  4.  Pt will improve score on PROM Baseline:  Goal status: ONGOING   ASSESSMENT:  CLINICAL IMPRESSION: Patient is a 53 y.o. 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 She presents with excess tensio and extraneous movements of body while speaking. She reports she is unable to keep her eyes open when she talks. She is able to speak over the phone better because she keeps her eyes closed and doesn't feel pressure to use eye contact. 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 Dyskinesias of face and body noted throughout evaluation. She was able to keep eyes open with phonation in HEP training. Speech dysfluent apporximately 25% of utterances with aytpical stutter and short 1-2 second blocks. Voice is strained with intermittent breaks/aphonia. I recommend skilled ST to maximize voice quality, intelligibility for safety, success at work and  return to [facility]. SABRA   OBJECTIVE IMPAIRMENTS: include voice disorder and speech disorder. These impairments are limiting patient from return to work and effectively communicating at home and in community. Factors affecting potential to achieve goals and functional outcome are co-morbidities.. Patient will benefit from skilled SLP services to address above impairments and improve overall function.  REHAB POTENTIAL: Good  PLAN:  SLP FREQUENCY: 1-2x/week  SLP DURATION: 10 weeks  PLANNED INTERVENTIONS: Environmental controls, Cueing hierachy, Cognitive reorganization, Internal/external aids, Functional tasks, Multimodal communication approach, SLP instruction and feedback, Compensatory strategies, Patient/family education, [phone] Treatment of speech (30 or 45 min) , and 07477- Speech Eval Sound Prod, Articulate, Phonological    Keyanni Whittinghill, Leita Caldron, CCC-SLP 08/24/2023, 3:36 PM

## 2023-08-24 NOTE — Patient Instructions (Signed)
    You have tension in your larynx limiting the air flow when you speak, resulting in a hoarse or gravelly voice. We call this pressed speech   We use flow phonation to improve proper air flow during speech. Sounds  that promote air flow include: s, z, sh, th, f, v, ch. We call this flow speech  After you complete the semi-occluded vocal tract exercises (straw exercises & gargling), complete the following twice a day  Watch  a tissue to focus on airflow:  Whooo 10x Shoe 10x Sue 10x  Shoe-Fu 3x Sue-Pu 3x He- She 3x Fu-Sue 3x Pu-Lu3x  Who are you?  Who is Beverley?    She sells sea shells  See Sue's shoes  Fifty-Fifty  Hit the hammer  High school hero  Hocus Pocus  To each his own  Zebra's zig zag at the zoo  [employer] chews cheddar cheese  Choosy moms choose [employer]  Fat Freddy prefers french fries  Vince vowed to vote  Feel the furry fish  She should polish her shoes  Show them the fresh fruit  Teachers eat ripe peaches at [employer]  Not now nor never  My mama makes me muffins  No one knows Norman's nickname  See Ginnie Sleep soundly by the sea

## 2023-08-25 ENCOUNTER — Ambulatory Visit: Payer: Self-pay

## 2023-08-25 VITALS — BP 143/112 | HR 117

## 2023-08-25 DIAGNOSIS — R258 Other abnormal involuntary movements: Secondary | ICD-10-CM

## 2023-08-25 DIAGNOSIS — R4789 Other speech disturbances: Secondary | ICD-10-CM | POA: Diagnosis not present

## 2023-08-25 DIAGNOSIS — R2681 Unsteadiness on feet: Secondary | ICD-10-CM

## 2023-08-25 DIAGNOSIS — R2689 Other abnormalities of gait and mobility: Secondary | ICD-10-CM | POA: Diagnosis not present

## 2023-08-25 NOTE — Therapy (Signed)
 OUTPATIENT PHYSICAL THERAPY NEURO TREATMENT   Patient Name: Lauren Levy MRN: [id number] DOB:Dec 08, 1970, 53 y.o., female Today's Date: 08/25/2023   PCP: Vernon Velna SAUNDERS, MD REFERRING PROVIDER: Hans Cinderella Duck, MD  END OF SESSION:  PT End of Session - 08/25/23 1105     Visit Number 9    Number of Visits 13    Date for PT Re-Evaluation 09/09/23    Authorization Type [employer]    PT Start Time 1106    PT Stop Time 1127    PT Time Calculation (min) 21 min    Equipment Utilized During Treatment Gait belt    Activity Tolerance Patient tolerated treatment well    Behavior During Therapy Anxious;Lability               Past Medical History:  Diagnosis Date   Ovarian cyst    Past Surgical History:  Procedure Laterality Date   BREAST BIOPSY Right 02/16/2021   papilloma   BREAST LUMPECTOMY Right 04/14/2021   BREAST LUMPECTOMY WITH RADIOACTIVE SEED LOCALIZATION Right 04/14/2021   Procedure: RIGHT BREAST LUMPECTOMY WITH RADIOACTIVE SEED LOCALIZATION;  Surgeon: Vanderbilt Ned, MD;  Location: [facility];  Service: General;  Laterality: Right;   KNEE SURGERY Right    MINOR CARPAL TUNNEL Right    Patient Active Problem List   Diagnosis Date Noted   Anxiety 01/12/2022   Foot pain, right 01/12/2022   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   Dystonia 06/03/2021   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 HEP is going okay but feels she does better when she does exercises with PT. Denies falls/close calls since last session.    Pt accompanied by: self  PERTINENT HISTORY: PMH: anxiety, focal dystonia  PAIN:  Are you having pain? No  PRECAUTIONS: None  PATIENT GOALS: see if we can help me talk without closing my eyes, strengthen my back so that I can sit like normal people (slides out of chair when sitting normally), help with my walking  TREATMENT   Self-Care/Home Management Vitals:   08/25/23 1109 08/25/23 1116  BP: (!) 148/119 (!) 143/112  Pulse: (!) 118 (!) 117  *sitting (start of session - utilized clinic BP cuff); sitting (start of session - utilized pt's personal BP cuff) *pt denies any lightheadedness/dizziness Education on PT exercise considerations with extremely elevated BP reading, when pt is/is not appropriate to continue with session Pt verbalized understanding and agreeable to hold on treatment session  Pt became overcome with emotion and expressed disappointment in not being able to participate in PT today; PT further discussed how her well-being/safety are the utmost priority Discussion surrounding pt arriving to next treatment session 10-15 mins early in order to give herself time to perform deep breathing / relaxation techniques prior to her treatment session time Pt verbalized understanding and agreeable to trial to see if this decreases seated BP Education on listening to her body, giving it time to rest and recover, and taking things easy if need be   PATIENT EDUCATION: Education details: see above, continue HEP, incorporation of deep breathing / relaxation techniques prior to engaging in future PT treatment sessions Person educated: Patient Education method: Explanation and Demonstration Education comprehension:  verbalized understanding, returned demonstration, and needs further education  HOME EXERCISE PROGRAM: -sit at dinning table for 1 meal with upright posture picturing self in mirror  Start with trying to sit upright x 15 sec without changing position, add 15 sec as this gets easier -work on sit to stands in front of full-length mirror at home, keep eyes open Add in counting up by 1's as this gets easier  -sidelying quad stretch 3 x 30 sec each B (verbally added 6/30, pt declines handout)  Access Code: [email or web address] URL: [email or web address] Date: 08/17/2023 Prepared by: Waddell Southgate  Exercises - Supine Bridge  - 1 x daily - 7 x weekly - 3 sets - 10 reps - Supine Quadricep Sets  - 1 x daily - 7 x weekly - 3 sets - 10 reps - Seated Hip Adduction Isometrics with Ball  - 1 x daily - 7 x weekly - 3 sets - 10 reps - 5 sec hold  GOALS: Goals reviewed with patient? Yes  SHORT TERM GOALS: Target date: 08/08/2023  Pt will be independent with initial HEP for improved strength, balance, transfers and gait. Baseline: Goal status: MET  2.  Pt will improve gait velocity to at least 3.0 ft/sec for improved gait efficiency and performance at SBA level  Baseline: 2.7 ft/sec SBA no AD (6/6), 3.36 ft/sec mod I no AD (6/30) Goal status: MET  3.  Pt will ambulate greater than or equal to 350 feet on with LRAD and SBA for improved cardiovascular endurance and BLE strength.  Baseline: 283 ft no AD SBA RPE 10/10 (6/6), 366 ft no AD mod I RPE 10/10 (6/30) Goal status: MET    LONG TERM GOALS: Target date: 08/29/2023   Pt will be independent with final HEP for improved strength, balance, transfers and gait. Baseline:  Goal status: INITIAL  2.  Pt will improve gait velocity to at least 3.5 ft/sec for improved gait efficiency and performance at mod I level  Baseline: 2.7 ft/sec SBA no AD (6/6), 3.36 ft/sec mod I no AD (6/30) Goal status: REVISED/UPGRADED  3.  Pt will ambulate  greater than or equal to 450 feet on with LRAD and SBA for improved cardiovascular endurance and BLE strength.  Baseline: 283 ft no AD SBA RPE 10/10 (6/6), 366 ft  no AD mod I RPE 10/10 (6/30) Goal status: INITIAL  4.  Pt will demonstrate a good understanding of her diagnosis (FND/FMD) and resources available to her. Baseline:  Goal status: INITIAL    ASSESSMENT:  CLINICAL IMPRESSION: Pt seen for skilled PT session with emphasis on patient education on BP monitoring/management, pt appropriateness for engaging in therapeutic exercise, and incorporating deep breathing/relaxation techniques prior to scheduled PT treatment session. Unable to safely continue session d/t extremely elevated BP readings (see above). PT provided education on listening to her body, giving it time to rest and recover, and taking things easy if need be for the rest of the day. Pt will continue to benefit from working on increasing her safety with functional mobility and returning to PLOF to improve QoL. Continue POC.  OBJECTIVE IMPAIRMENTS: Abnormal gait, cardiopulmonary status limiting activity, decreased activity tolerance, decreased balance, decreased coordination, decreased endurance, decreased knowledge of condition, decreased knowledge of use of DME, decreased mobility, difficulty walking, impaired perceived functional ability, impaired sensation, improper body mechanics, and postural dysfunction.   ACTIVITY LIMITATIONS: carrying, lifting, bending, sitting, standing, squatting, sleeping, stairs, transfers, and bed mobility  PARTICIPATION LIMITATIONS: meal prep, cleaning, laundry, interpersonal relationship, driving, shopping, community activity, and occupation  PERSONAL FACTORS: Time since onset of injury/illness/exacerbation and 1-2 comorbidities: anxiety and focal dystonia are also affecting patient's functional outcome.   REHAB POTENTIAL: Good  CLINICAL DECISION MAKING: Evolving/moderate  complexity  EVALUATION COMPLEXITY: High  PLAN:  PT FREQUENCY: 2x/week  PT DURATION: 6 weeks  PLANNED INTERVENTIONS: 02835- PT Re-evaluation, 97750- Physical Performance Testing, 97110-Therapeutic exercises, 97530- Therapeutic activity, 97112- Neuromuscular re-education, 97535- Self Care, 02859- Manual therapy, [phone]- Gait training, [phone]- Aquatic Therapy, [phone]- Electrical stimulation (manual), [phone] (1-2 muscles), [phone] (3+ muscles)- Dry Needling, Patient/Family education, Balance training, Stair training, Taping, Joint mobilization, Spinal mobilization, DME instructions, Cryotherapy, and Moist heat  PLAN FOR NEXT SESSION: assess seated BP, quadruped and tall kneel to work on proximal stability, assess hip abd strength, work on core strengthening, functional movements, resisted gait, blaze pods, cognitive dual tasking (particularly having to speak while doing a physical task), Yoga for functional stretching- warrior pose?   Waddell Nailer, Student-PT  08/25/2023, 12:30 PM

## 2023-08-26 ENCOUNTER — Encounter (HOSPITAL_COMMUNITY): Payer: Self-pay

## 2023-08-26 ENCOUNTER — Ambulatory Visit: Payer: Self-pay

## 2023-08-26 ENCOUNTER — Emergency Department (HOSPITAL_COMMUNITY)

## 2023-08-26 ENCOUNTER — Telehealth: Payer: Self-pay

## 2023-08-26 ENCOUNTER — Other Ambulatory Visit: Payer: Self-pay

## 2023-08-26 ENCOUNTER — Emergency Department (HOSPITAL_COMMUNITY)
Admission: EM | Admit: 2023-08-26 | Discharge: 2023-08-26 | Disposition: A | Discharge status: Home / Self Care | Attending: Emergency Medicine | Admitting: Emergency Medicine

## 2023-08-26 VITALS — BP 153/107 | HR 108

## 2023-08-26 DIAGNOSIS — R258 Other abnormal involuntary movements: Secondary | ICD-10-CM | POA: Diagnosis not present

## 2023-08-26 DIAGNOSIS — R079 Chest pain, unspecified: Secondary | ICD-10-CM | POA: Diagnosis not present

## 2023-08-26 DIAGNOSIS — R2689 Other abnormalities of gait and mobility: Secondary | ICD-10-CM | POA: Diagnosis not present

## 2023-08-26 DIAGNOSIS — R531 Weakness: Secondary | ICD-10-CM | POA: Insufficient documentation

## 2023-08-26 DIAGNOSIS — R42 Dizziness and giddiness: Secondary | ICD-10-CM | POA: Insufficient documentation

## 2023-08-26 DIAGNOSIS — R0789 Other chest pain: Secondary | ICD-10-CM | POA: Diagnosis not present

## 2023-08-26 DIAGNOSIS — R2681 Unsteadiness on feet: Secondary | ICD-10-CM

## 2023-08-26 DIAGNOSIS — G248 Other dystonia: Secondary | ICD-10-CM | POA: Diagnosis not present

## 2023-08-26 DIAGNOSIS — G249 Dystonia, unspecified: Secondary | ICD-10-CM

## 2023-08-26 DIAGNOSIS — R4789 Other speech disturbances: Secondary | ICD-10-CM | POA: Diagnosis not present

## 2023-08-26 LAB — BASIC METABOLIC PANEL WITH GFR
Anion gap: 10 (ref 5–15)
BUN: 13 mg/dL (ref 6–20)
CO2: 27 mmol/L (ref 22–32)
Calcium: 9.7 mg/dL (ref 8.9–10.3)
Chloride: 101 mmol/L (ref 98–111)
Creatinine, Ser: 0.46 mg/dL (ref 0.44–1.00)
GFR, Estimated: >60 mL/min (ref >60)
Glucose, Bld: 90 mg/dL (ref 70–99)
Potassium: 3.8 mmol/L (ref 3.5–5.1)
Sodium: 138 mmol/L (ref 135–145)

## 2023-08-26 LAB — CBC
HCT: 38.3 % (ref 36.0–46.0)
Hemoglobin: 12.5 g/dL (ref 12.0–15.0)
MCH: 29.3 pg (ref 26.0–34.0)
MCHC: 32.6 g/dL (ref 30.0–36.0)
MCV: 89.7 fL (ref 80.0–100.0)
Platelets: 354 K/uL (ref 150–400)
RBC: 4.27 MIL/uL (ref 3.87–5.11)
RDW: 13 % (ref 11.5–15.5)
WBC: 4.9 K/uL (ref 4.0–10.5)
nRBC: 0 % (ref 0.0–0.2)

## 2023-08-26 LAB — TROPONIN I (HIGH SENSITIVITY)
Troponin I (High Sensitivity): 2 ng/L (ref <18)
Troponin I (High Sensitivity): 2 ng/L (ref <18)

## 2023-08-26 LAB — D-DIMER, QUANTITATIVE: D-Dimer, Quant: 0.35 ug{FEU}/mL (ref 0.00–0.50)

## 2023-08-26 MED ORDER — PROPRANOLOL HCL 20 MG PO TABS
10.0000 mg | ORAL_TABLET | Freq: Once | ORAL | Status: AC
  Administered 2023-08-26: 10 mg via ORAL
  Filled 2023-08-26: qty 1

## 2023-08-26 MED ORDER — LORAZEPAM 2 MG/ML IJ SOLN
1.0000 mg | Freq: Once | INTRAMUSCULAR | Status: AC
  Administered 2023-08-26: 1 mg via INTRAVENOUS
  Filled 2023-08-26: qty 1

## 2023-08-26 MED ORDER — DIAZEPAM 5 MG PO TABS: 5.0000 mg | ORAL_TABLET | Freq: Every day | ORAL | 0 refills | Status: AC

## 2023-08-26 NOTE — Discharge Instructions (Addendum)
 Your evaluated in the emergency room for chest pain, dizziness and weakness.  Your lab work and imaging did not show any significant abnormality.  Please follow-up with your primary care doctor as discussed.  You were provided a referral for neurology.  You should be expecting a call within the next week.

## 2023-08-26 NOTE — ED Provider Notes (Signed)
 [place] EMERGENCY DEPARTMENT AT [facility] Provider Note   CSN: [id number] Arrival date & time: 08/26/23  1207     Patient presents with: Chest Pain and Dizziness   Lauren Levy is a 53 y.o. female with history of tar dive dyskinesia presents with complaints of full body tightness with chest heaviness.  Patient states the symptoms have been ongoing since 2023.  She briefly took risperidone, reportedly prescribed for PTSD at that time.  Following that she had involuntary facial movements with global weakness.  The medication has since been ceased.  She is followed with neurology and has had reassuring lab work and brain imaging to this point.  She presents requesting relief for her current symptoms.   Appears that she has received Ativan  in the past with improvement of her symptoms.  She denies any shortness of breath.  She has no cardiac history or prior blood clots.    Chest Pain Associated symptoms: dizziness   Dizziness Associated symptoms: chest pain    Past Medical History:  Diagnosis Date   Ovarian cyst       Past Medical History:  Diagnosis Date   Ovarian cyst    Past Surgical History:  Procedure Laterality Date   BREAST BIOPSY Right 02/16/2021   papilloma   BREAST LUMPECTOMY Right 04/14/2021   BREAST LUMPECTOMY WITH RADIOACTIVE SEED LOCALIZATION Right 04/14/2021   Procedure: RIGHT BREAST LUMPECTOMY WITH RADIOACTIVE SEED LOCALIZATION;  Surgeon: Vanderbilt Ned, MD;  Location: [facility];  Service: General;  Laterality: Right;   KNEE SURGERY Right    MINOR CARPAL TUNNEL Right      Prior to Admission medications   Medication Sig Start Date End Date Taking? Authorizing Provider  diazepam  (VALIUM ) 5 MG tablet Take 1 tablet (5 mg total) by mouth daily. 08/26/23  Yes Donnajean Lynwood DEL, PA-C  botulinum toxin Type A  (BOTOX) 100 units SOLR injection Inject 100 Units into the muscle every 3 (three) months. To be injected by MD in the  office Patient not taking: Reported on 12/23/2022 05/20/21   Sater, Charlie LABOR, MD  Cholecalciferol (VITAMIN D3) 125 MCG (5000 UT) CAPS Take 1 tablet by mouth daily.    [provider]  clonazePAM  (KLONOPIN ) 0.5 MG tablet TAKE ONE TABLET BY MOUTH TWICE A DAY AS NEEDED FOR ANXIETY Patient taking differently: Take 0.5 mg by mouth 2 (two) times daily. 12/30/21   Sater, Charlie LABOR, MD  Cyanocobalamin (VITAMIN B 12 PO) Take 1 tablet by mouth daily.    [provider]  DULoxetine  (CYMBALTA ) 30 MG capsule TAKE 1 CAPSULE(30 MG) BY MOUTH DAILY 04/25/23   Onita Duos, MD  hydrOXYzine (ATARAX) 25 MG tablet Take 25 mg by mouth 3 (three) times daily as needed for anxiety.    [provider]  Anselm Frizzle (OMEGA-3) 500 MG CAPS Take 1 tablet by mouth daily.    [provider]  Magnesium 200 MG TABS Take 2 tablets by mouth daily.    [provider]  mirtazapine (REMERON) 7.5 MG tablet Take 7.5 mg by mouth at bedtime. 05/05/22   [provider]  Multiple Vitamins-Minerals (CENTRUM SILVER 50+WOMEN) TABS Take 1 tablet by mouth daily.    [provider]  propranolol  (INDERAL ) 20 MG tablet Take 20 mg by mouth 3 (three) times daily. 12/01/21   [provider]    Allergies: Patient has no known allergies.    Review of Systems  Cardiovascular:  Positive for chest pain.  Neurological:  Positive for dizziness.    Updated Vital Signs BP 103/74   Pulse 84   Temp 98.5 F (36.9 C) (Oral)   Resp 17   Ht 5' 7 (1.702 m)   Wt 85.7 kg   LMP 03/10/2016   SpO2 97%   BMI 29.59 kg/m   Physical Exam Vitals and nursing note reviewed.  Constitutional:      General: She is not in acute distress.    Appearance: She is well-developed.  HENT:     Head: Normocephalic and atraumatic.  Eyes:     Conjunctiva/sclera: Conjunctivae normal.  Cardiovascular:     Rate and Rhythm: Normal rate and regular rhythm.     Heart sounds: No murmur heard. Pulmonary:      Effort: Pulmonary effort is normal. No respiratory distress.     Breath sounds: Normal breath sounds.  Abdominal:     Palpations: Abdomen is soft.     Tenderness: There is no abdominal tenderness.  Musculoskeletal:        General: No swelling.     Cervical back: Neck supple.  Skin:    General: Skin is warm and dry.     Capillary Refill: Capillary refill takes less than 2 seconds.  Neurological:     Mental Status: She is alert.     Comments: Patient is alert and oriented. There is no abnormal phonation. Symmetric smile without facial droop. Moves all extremities spontaneously. 5/5 strength in upper and lower extremities. . No sensation deficit. There is no nystagmus. EOMI, PERRL  Psychiatric:        Mood and Affect: Mood normal.     (all labs ordered are listed, but only abnormal results are displayed) Labs Reviewed  BASIC METABOLIC PANEL WITH GFR  CBC  D-DIMER, QUANTITATIVE  TROPONIN I (HIGH SENSITIVITY)  TROPONIN I (HIGH SENSITIVITY)    EKG: None  Radiology: DG Chest Port 1 View Result Date: 08/26/2023 CLINICAL DATA:  Chest pain EXAM: PORTABLE CHEST 1 VIEW COMPARISON:  X-ray 11/01/2021 FINDINGS: No consolidation, pneumothorax or effusion. No edema. Normal cardiopericardial silhouette. Overlapping cardiac leads. IMPRESSION: No acute cardiopulmonary disease. Electronically Signed   By: Ranell Bring M.D.   On: 08/26/2023 14:38     Procedures   Medications Ordered in the ED  LORazepam  (ATIVAN ) injection 1 mg (1 mg Intravenous Given 08/26/23 1522)  propranolol  (INDERAL ) tablet 10 mg (10 mg Oral Given 08/26/23 1522)                                    Medical Decision Making Amount and/or Complexity of Data Reviewed Labs: ordered. Radiology: ordered.  Risk Prescription drug management.   This patient presents to the ED with chief complaint(s) of dyskinesia.  The complaint involves an extensive differential diagnosis and also carries with it a high risk of complications  and morbidity.   Pertinent past medical history as listed in HPI  The differential diagnosis includes  ACS, PE, tardive dyskinesia, CVA or TIA Additional history obtained: Records reviewed Care Everywhere/External Records  Assessment and management:   Patient presents tachycardic with complaints of generalized weakness and chest heaviness with dizziness.  Notes that she has had dyskinesia for the past 2 years thought to be secondary to risperidone.  She has had multiple ED visits with similar presentations.  She reports no new symptoms today.  States that they are unchanged for the past 2 years.  Has followed with  neurology and has had reassuring lab work and imaging to this point.  She has no neurodeficits on exam today.  Looks like she has had significant improvement in the past with a dose of propranolol  and Ativan .  Will perform cardiac workup and give similar treatment here in the ED.  Workup overall reassuring.  Patient noted improvement with dose of Ativan  here today.  She is requesting prescription today.  However chart review demonstrates that she has had numerous prescriptions filled recently.  Independent ECG interpretation:  Sinus tachycardia  Independent labs interpretation:  The following labs were independently interpreted:  CBC and BMP unremarkable, dimer within normal limits, troponin without elevation  Independent visualization and interpretation of imaging: I independently visualized the following imaging with scope of interpretation limited to determining acute life threatening conditions related to emergency care:  Chest x-ray no acute cardiopulmonary disease   Consultations obtained:   none  Disposition:   Patient will be discharged home. The patient has been appropriately medically screened and/or stabilized in the ED. I have low suspicion for any other emergent medical condition which would require further screening, evaluation or treatment in the ED or require  inpatient management. At time of discharge the patient is hemodynamically stable and in no acute distress. I have discussed work-up results and diagnosis with patient and answered all questions. Patient is agreeable with discharge plan. We discussed strict return precautions for returning to the emergency department and they verbalized understanding.     Social Determinants of Health:   none  This note was dictated with voice recognition software.  Despite best efforts at proofreading, errors may have occurred which can change the documentation meaning.       Final diagnoses:  Atypical chest pain  Dystonia    ED Discharge Orders          Ordered    diazepam  (VALIUM ) 5 MG tablet  Daily        08/26/23 1746    Ambulatory referral to Neurology       Comments: An appointment is requested in approximately: 2 weeks   08/26/23 1747               Donnajean Lynwood DEL, PA-C 08/26/23 1748    Geraldene Hamilton, MD 08/29/23 0900

## 2023-08-26 NOTE — Telephone Encounter (Signed)
 This PT contacted patients PCP at 1322 and left a message with Lucie regarding multiple patient concerns. Patients BP remains significantly elevated, despite excessive time completing mindfulness exercises and resting- profoundly limiting patients ability to participate in any meaningful therapy. Patient would also like a referral to neurology for a second opinion. Patient also voicing concerns over who is managing her medication, specifically her diazepam. Per patient, when she was seen in the ER in 2023 they gave her diazepam and it helped a lot and was told to f/u with her PCP so that the PCP could continue to prescribe it. Per patient, her PCP told her that the movement specialist/neurologist would prescribe this. Per patient, the neurologist told her that her PCP should be managing this. PT awaiting call back at the time of writing this.   Delon DELENA Pop, PT, DPT, CBIS

## 2023-08-26 NOTE — Therapy (Signed)
 OUTPATIENT PHYSICAL THERAPY NEURO TREATMENT   Patient Name: Lauren Levy MRN: [id number] DOB:Jun 16, 1970, 53 y.o., female Today's Date: 08/26/2023   PCP: Vernon Velna SAUNDERS, MD REFERRING PROVIDER: Hans Cinderella Duck, MD  END OF SESSION:  PT End of Session - 08/26/23 1126     Visit Number 10    Number of Visits 13    Date for PT Re-Evaluation 09/09/23    Authorization Type Aet[employer]    PT Start Time 1100    PT Stop Time 1146    PT Time Calculation (min) 46 min    Activity Tolerance Treatment limited secondary to medical complications (Comment)   HTN   Behavior During Therapy Anxious;Lability               Past Medical History:  Diagnosis Date   Ovarian cyst    Past Surgical History:  Procedure Laterality Date   BREAST BIOPSY Right 02/16/2021   papilloma   BREAST LUMPECTOMY Right 04/14/2021   BREAST LUMPECTOMY WITH RADIOACTIVE SEED LOCALIZATION Right 04/14/2021   Procedure: RIGHT BREAST LUMPECTOMY WITH RADIOACTIVE SEED LOCALIZATION;  Surgeon: Vanderbilt Ned, MD;  Location: [facility];  Service: General;  Laterality: Right;   KNEE SURGERY Right    MINOR CARPAL TUNNEL Right    Patient Active Problem List   Diagnosis Date Noted   Anxiety 01/12/2022   Foot pain, right 01/12/2022   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   Dystonia 06/03/2021   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 HEP is going okay but feels she does better when she does exercises with PT. Denies falls/close calls since last session.    Pt accompanied by: self  PERTINENT HISTORY: PMH: anxiety, focal dystonia  PAIN:  Are you having pain? No  PRECAUTIONS: None  PATIENT GOALS: see if we can help me talk without closing my eyes, strengthen my back so that I can sit like normal people (slides out of chair when sitting normally), help with my walking  TREATMENT  Theract: Vitals:   08/26/23 1203  BP: (!) 153/107  Pulse: (!) 108   -Extensive discussion re: impact of BP on progress in therapy  -remains too hypertensive to safely participate in activity -time spent discussing various etiologies of her current symptoms  -patient voicing frustration regarding scattered medical management  -PT to contact PCP and current movement disorder provider regarding medical management with patients consent to do so   PATIENT EDUCATION: Education details: see above Person educated: Patient Education method: Medical illustrator Education comprehension: verbalized understanding, returned demonstration, and needs further education  HOME EXERCISE PROGRAM: -sit at dinning table for 1 meal with upright posture picturing self in mirror  Start with trying to sit upright x 15 sec without changing position, add 15 sec as this gets easier -work on sit to stands in front of full-length mirror at home, keep eyes open Add in counting up by 1's as this gets easier  -sidelying quad stretch 3 x 30 sec each B (verbally added 6/30, pt declines handout)  Access Code: [email or web address] URL: [email or web address] Date: 08/17/2023 Prepared by: Waddell Southgate  Exercises - Supine Bridge  - 1 x daily - 7 x weekly - 3 sets - 10 reps - Supine Quadricep Sets  - 1 x daily -  7 x weekly - 3 sets - 10 reps - Seated Hip Adduction Isometrics with Ball  - 1 x daily - 7 x weekly - 3 sets - 10 reps - 5 sec hold  GOALS: Goals reviewed with patient? Yes  SHORT TERM GOALS: Target date: 08/08/2023  Pt will be independent with initial HEP for improved strength, balance, transfers and gait. Baseline: Goal status: MET  2.  Pt will improve gait velocity to at least 3.0 ft/sec for improved gait efficiency and performance at SBA level  Baseline: 2.7 ft/sec SBA no AD (6/6), 3.36 ft/sec mod I no AD (6/30) Goal status: MET  3.  Pt will ambulate greater than or equal to 350 feet on with LRAD and SBA for improved cardiovascular endurance and BLE strength.  Baseline: 283 ft no AD SBA RPE 10/10 (6/6), 366 ft no AD mod I RPE 10/10 (6/30) Goal status: MET    LONG TERM GOALS: Target date: 08/29/2023   Pt will be independent with final HEP for improved strength, balance, transfers and gait. Baseline:  Goal status: INITIAL  2.  Pt will improve gait velocity to at least 3.5 ft/sec for improved gait efficiency and performance at mod I level  Baseline: 2.7 ft/sec SBA no AD (6/6), 3.36 ft/sec mod I no AD (6/30) Goal status: REVISED/UPGRADED  3.  Pt will ambulate greater than or equal to 450 feet on with LRAD and SBA for improved cardiovascular endurance and BLE strength.  Baseline: 283 ft no AD SBA RPE 10/10 (6/6), 366 ft no AD mod I RPE 10/10 (6/30) Goal status: INITIAL  4.  Pt will demonstrate a good understanding of her diagnosis (FND/FMD) and resources available to her. Baseline:  Goal status: INITIAL    ASSESSMENT:  CLINICAL IMPRESSION: Patient seen for skilled PT session with emphasis on patient education. Daughter present for this session. Patient arrived ~15 mins early and was able to relax in dimmed treatment room. Despite time spent relaxing, patient remains significantly hypertensive. Patient, understandably, frustrated voicing concerns that her BP is not  being medically managed. Patient with very limited ability to participate in meaningful therapy to progress toward her therapy goals due to her BP. She endorses  frustration with providers assuming her current state is solely due to anxiety/ being in her head. She reports greatest resolution of her symptoms when provided with propanalol and diazepam when at the ER in 2023. She has not taken diazepam, or an equivalent, in many months.Patient and PT to follow up with providers to allow for greatest progress possible in PT to return to prior level of function. Continue POC as able.   OBJECTIVE IMPAIRMENTS: Abnormal gait, cardiopulmonary status limiting activity, decreased activity tolerance, decreased balance, decreased coordination, decreased endurance, decreased knowledge of condition, decreased knowledge of use of DME, decreased mobility, difficulty walking, impaired perceived functional ability, impaired sensation, improper body mechanics, and postural dysfunction.   ACTIVITY LIMITATIONS: carrying, lifting, bending, sitting, standing, squatting, sleeping, stairs, transfers, and bed mobility  PARTICIPATION LIMITATIONS: meal prep, cleaning, laundry, interpersonal relationship, driving, shopping, community activity, and occupation  PERSONAL FACTORS: Time since onset of injury/illness/exacerbation and 1-2 comorbidities: anxiety and focal dystonia are also affecting patient's functional outcome.   REHAB POTENTIAL: Good  CLINICAL DECISION MAKING: Evolving/moderate complexity  EVALUATION COMPLEXITY: High  PLAN:  PT FREQUENCY: 2x/week  PT DURATION: 6 weeks  PLANNED INTERVENTIONS: 02835- PT Re-evaluation, 97750- Physical Performance Testing, 97110-Therapeutic exercises, 97530- Therapeutic activity, 97112- Neuromuscular re-education, 97535- Self Care, 02859- Manual therapy, [phone]- Gait training, [phone]- Aquatic Therapy, [phone]- Electrical stimulation (manual), [phone] (1-2 muscles), [phone] (3+ muscles)- Dry  Needling, Patient/Family education, Balance training, Stair training, Taping, Joint mobilization, Spinal mobilization, DME instructions, Cryotherapy, and Moist heat  PLAN FOR NEXT SESSION: assess seated BP, quadruped and tall kneel to work on proximal stability, assess hip abd strength, work on core strengthening, functional movements, resisted gait, blaze pods, cognitive dual tasking (particularly having to speak while doing a physical task), Yoga for functional stretching- warrior pose?, have we heard from providers?   Delon DELENA Pop, PT Delon DELENA Pop, PT, DPT, CBIS  08/26/2023, 12:51 PM

## 2023-08-26 NOTE — ED Triage Notes (Signed)
 Pt arrived via POV, c/o overall body weakness, dizziness, her body feels tight and feels pressure and pain like something is sitting on her chest.

## 2023-08-31 ENCOUNTER — Ambulatory Visit

## 2023-08-31 ENCOUNTER — Ambulatory Visit: Admitting: Speech Pathology

## 2023-08-31 ENCOUNTER — Ambulatory Visit: Payer: Self-pay | Admitting: Physical Therapy

## 2023-08-31 VITALS — BP 142/105 | HR 108

## 2023-08-31 DIAGNOSIS — R2681 Unsteadiness on feet: Secondary | ICD-10-CM

## 2023-08-31 DIAGNOSIS — R258 Other abnormal involuntary movements: Secondary | ICD-10-CM | POA: Diagnosis not present

## 2023-08-31 DIAGNOSIS — R4789 Other speech disturbances: Secondary | ICD-10-CM | POA: Diagnosis not present

## 2023-08-31 DIAGNOSIS — R2689 Other abnormalities of gait and mobility: Secondary | ICD-10-CM | POA: Diagnosis not present

## 2023-08-31 NOTE — Therapy (Signed)
 OUTPATIENT PHYSICAL THERAPY NEURO TREATMENT   Patient Name: Lauren Levy MRN: [id number] DOB:July 28, 1970, 53 y.o., female Today's Date: 08/31/2023   PCP: Vernon Velna SAUNDERS, MD REFERRING PROVIDER: Hans Cinderella Duck, MD  END OF SESSION:  PT End of Session - 08/31/23 1444     Visit Number 11    Number of Visits 13    Date for PT Re-Evaluation 09/09/23    Authorization Type Aetna    PT Start Time 1446    PT Stop Time 1535    PT Time Calculation (min) 49 min    Activity Tolerance Other (comment)   HTN   Behavior During Therapy WFL for tasks assessed/performed               Past Medical History:  Diagnosis Date   Ovarian cyst    Past Surgical History:  Procedure Laterality Date   BREAST BIOPSY Right 02/16/2021   papilloma   BREAST LUMPECTOMY Right 04/14/2021   BREAST LUMPECTOMY WITH RADIOACTIVE SEED LOCALIZATION Right 04/14/2021   Procedure: RIGHT BREAST LUMPECTOMY WITH RADIOACTIVE SEED LOCALIZATION;  Surgeon: Vanderbilt Ned, MD;  Location: [facility];  Service: General;  Laterality: Right;   KNEE SURGERY Right    MINOR CARPAL TUNNEL Right    Patient Active Problem List   Diagnosis Date Noted   Anxiety 01/12/2022   Foot pain, right 01/12/2022   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   Dystonia 06/03/2021   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 Did go to the ER after last appt with reports of  chest pain and dizziness. They gave her ativan  and she felt like a new baby. BP readings at home are normal to even labile. Patient reports anxiety about being turned away due to elevated BP. Continues to voice frustration about who is managing her rx. Reports having a Drs appt with Dr. Vernon.   Pt accompanied by: self  PERTINENT HISTORY: PMH: anxiety, focal dystonia  PAIN:  Are you having pain? No  PRECAUTIONS: None  PATIENT GOALS: see if we can help me talk without closing my eyes, strengthen my  back so that I can sit like normal people (slides out of chair when sitting normally), help with my walking                                                                                                                               TREATMENT  Theract: Vitals:   08/31/23 1451 08/31/23 1503  BP: (!) 172/112 (!) 142/105  Pulse: (!) 112 (!) 108   -review of Rx that she is NOT taking:  -ClonazePAM   -diazepam    -propanolol  -botox injections -reviewed with patient last PCP and neuro notes and their individual assessments as documented including rx to be prescribed -patient agreeable to contact PCP and/pr neuro for further medical work up   PATIENT EDUCATION: Education details: see above Person educated: Patient Education method: Psychiatrist comprehension: verbalized understanding, returned demonstration, and needs further education  HOME EXERCISE PROGRAM: -sit at dinning table for 1 meal with upright posture picturing self in mirror  Start with trying to sit upright x 15 sec without changing position, add 15 sec as this gets easier -work on sit to stands in front of full-length mirror at home, keep eyes open Add in counting up by 1's as this gets easier  -sidelying quad stretch 3 x 30 sec each B (verbally added 6/30, pt declines handout)  Access Code: [email or web address] URL: [email or web address] Date: 08/17/2023 Prepared by: Waddell Southgate  Exercises - Supine Bridge  - 1 x daily - 7 x weekly - 3 sets - 10 reps - Supine Quadricep Sets  - 1 x daily - 7 x weekly - 3 sets - 10 reps - Seated Hip Adduction Isometrics with Ball  - 1 x daily - 7 x weekly - 3 sets - 10 reps - 5 sec hold  GOALS: Goals reviewed with patient? Yes  SHORT TERM GOALS: Target date: 08/08/2023  Pt will be independent with initial HEP for improved strength, balance, transfers and gait. Baseline: Goal status: MET  2.  Pt will improve gait velocity to at least 3.0 ft/sec for improved gait efficiency and performance at SBA level  Baseline: 2.7 ft/sec SBA no AD (6/6), 3.36 ft/sec mod I no AD (6/30) Goal status: MET  3.  Pt will ambulate greater than or equal to 350 feet on with LRAD and SBA for improved cardiovascular endurance and BLE strength.  Baseline: 283 ft no AD SBA RPE 10/10 (6/6), 366 ft no AD mod I RPE 10/10 (6/30) Goal status: MET    LONG TERM GOALS: Target date: 08/29/2023   Pt will be independent with final HEP for improved strength, balance, transfers and gait. Baseline:  Goal status: INITIAL  2.  Pt will improve gait velocity to at least 3.5 ft/sec for improved gait efficiency and performance at mod I level  Baseline: 2.7 ft/sec SBA no AD (6/6), 3.36 ft/sec mod I no AD (6/30) Goal status: REVISED/UPGRADED  3.  Pt will ambulate  greater than or equal to 450 feet on with LRAD and SBA for improved cardiovascular endurance and BLE strength.  Baseline: 283 ft no AD SBA RPE 10/10 (6/6), 366 ft no AD mod I RPE 10/10 (6/30) Goal status: INITIAL  4.  Pt will demonstrate a good understanding of her diagnosis (FND/FMD) and resources available to her. Baseline:  Goal status: INITIAL    ASSESSMENT:  CLINICAL IMPRESSION: Patient seen for skilled PT session with emphasis on patient education. She remains significantly hypertensive profoundly impacting her ability to participate in meaningful physical therapy targeting her  physical impairments. Time has been spent coordinating with various other medical providers to confirm medical plan of action to allow her to participate in PT safely and progress toward her goals. Continue POC as able.   OBJECTIVE IMPAIRMENTS: Abnormal gait, cardiopulmonary status limiting activity, decreased activity tolerance, decreased balance, decreased coordination, decreased endurance, decreased knowledge of condition, decreased knowledge of use of DME, decreased mobility, difficulty walking, impaired perceived functional ability, impaired sensation, improper body mechanics, and postural dysfunction.   ACTIVITY LIMITATIONS: carrying, lifting, bending, sitting, standing, squatting, sleeping, stairs, transfers, and bed mobility  PARTICIPATION LIMITATIONS: meal prep, cleaning, laundry, interpersonal relationship, driving, shopping, community activity, and occupation  PERSONAL FACTORS: Time since onset of injury/illness/exacerbation and 1-2 comorbidities: anxiety and focal dystonia are also affecting patient's functional outcome.   REHAB POTENTIAL: Good  CLINICAL DECISION MAKING: Evolving/moderate complexity  EVALUATION COMPLEXITY: High  PLAN:  PT FREQUENCY: 2x/week  PT DURATION: 6 weeks  PLANNED INTERVENTIONS: 02835- PT Re-evaluation, 97750- Physical Performance Testing, 97110-Therapeutic exercises, 97530- Therapeutic activity, 97112- Neuromuscular re-education, 97535- Self Care, 02859- Manual therapy, [phone]- Gait training, [phone]- Aquatic Therapy, [phone]- Electrical stimulation (manual), [phone] (1-2 muscles), [phone] (3+ muscles)- Dry Needling, Patient/Family education, Balance training, Stair training, Taping, Joint mobilization, Spinal mobilization, DME instructions, Cryotherapy, and Moist heat  PLAN FOR NEXT SESSION: assess seated BP, quadruped and tall kneel to work on proximal stability, assess hip abd strength, work on core strengthening, functional movements, resisted gait, blaze pods,  cognitive dual tasking (particularly having to speak while doing a physical task), Yoga for functional stretching- warrior pose?, have we heard from providers?   Delon DELENA Pop, PT Delon DELENA Pop, PT, DPT, CBIS  08/31/2023, 4:04 PM

## 2023-08-31 NOTE — Therapy (Incomplete)
 OUTPATIENT PHYSICAL THERAPY NEURO TREATMENT   Patient Name: Lauren Levy MRN: [id number] DOB:1970/07/28, 53 y.o., female Today's Date: 08/31/2023   PCP: Vernon Velna SAUNDERS, MD REFERRING PROVIDER: Hans Cinderella Duck, MD  END OF SESSION:         Past Medical History:  Diagnosis Date   Ovarian cyst    Past Surgical History:  Procedure Laterality Date   BREAST BIOPSY Right 02/16/2021   papilloma   BREAST LUMPECTOMY Right 04/14/2021   BREAST LUMPECTOMY WITH RADIOACTIVE SEED LOCALIZATION Right 04/14/2021   Procedure: RIGHT BREAST LUMPECTOMY WITH RADIOACTIVE SEED LOCALIZATION;  Surgeon: Vanderbilt Ned, MD;  Location: [facility];  Service: General;  Laterality: Right;   KNEE SURGERY Right    MINOR CARPAL TUNNEL Right    Patient Active Problem List   Diagnosis Date Noted   Anxiety 01/12/2022   Foot pain, right 01/12/2022   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  Rationale for Evaluation and Treatment: Rehabilitation  SUBJECTIVE:                                                                                                                                                                                             SUBJECTIVE STATEMENT:  Pt reports she is not currently in pain, more discomfort. HEP is going okay but feels she does better when she does exercises with PT. Denies falls/close calls since last session.    ***  Pt accompanied by: self  PERTINENT HISTORY: PMH: anxiety, focal dystonia  PAIN:  Are you having pain? No  PRECAUTIONS: None  PATIENT GOALS: see if we can help me talk without closing my eyes, strengthen my back so that I can sit like normal people (slides out of chair when sitting normally), help  with my walking  TREATMENT  Theract: There were no vitals filed for this visit.  -Extensive discussion re: impact of BP on progress in therapy  -remains too hypertensive to safely participate in activity -time spent discussing various etiologies of her current symptoms  -patient voicing frustration regarding scattered medical management  -PT to contact PCP and current movement disorder provider regarding medical management with patients consent to do so   ***   PATIENT EDUCATION: Education details: see above*** Person educated: Patient Education method: Medical illustrator Education comprehension: verbalized understanding, returned demonstration, and needs further education  HOME EXERCISE PROGRAM: -sit at dinning table for 1 meal with upright posture picturing self in mirror  Start with trying to sit upright x 15 sec without changing position, add 15 sec as this gets easier -work on sit to stands in front of full-length mirror at home, keep eyes open Add in counting up by 1's as this gets easier  -sidelying quad stretch 3 x 30 sec each B (verbally added 6/30, pt declines handout)  Access Code: [email or web address] URL: [email or web address] Date: 08/17/2023 Prepared by: Waddell Southgate  Exercises - Supine Bridge  - 1 x daily - 7 x weekly - 3 sets - 10 reps - Supine Quadricep Sets  - 1 x daily - 7 x weekly - 3 sets - 10 reps - Seated Hip Adduction Isometrics with Ball  - 1 x daily - 7 x weekly - 3 sets - 10 reps - 5 sec hold  GOALS: Goals reviewed with patient? Yes  SHORT TERM GOALS: Target date: 08/08/2023  Pt will be independent with initial HEP for improved strength, balance, transfers and gait. Baseline: Goal status: MET  2.  Pt will improve gait velocity to at least 3.0 ft/sec for improved gait efficiency and performance at SBA  level  Baseline: 2.7 ft/sec SBA no AD (6/6), 3.36 ft/sec mod I no AD (6/30) Goal status: MET  3.  Pt will ambulate greater than or equal to 350 feet on with LRAD and SBA for improved cardiovascular endurance and BLE strength.  Baseline: 283 ft no AD SBA RPE 10/10 (6/6), 366 ft no AD mod I RPE 10/10 (6/30) Goal status: MET    LONG TERM GOALS: Target date: 08/29/2023***   Pt will be independent with final HEP for improved strength, balance, transfers and gait. Baseline:  Goal status: INITIAL  2.  Pt will improve gait velocity to at least 3.5 ft/sec for improved gait efficiency and performance at mod I level  Baseline: 2.7 ft/sec SBA no AD (6/6), 3.36 ft/sec mod I no AD (6/30) Goal status: REVISED/UPGRADED  3.  Pt will ambulate greater than or equal to 450 feet on with LRAD and SBA for improved cardiovascular endurance and BLE strength.  Baseline: 283 ft no AD SBA RPE 10/10 (6/6), 366 ft no AD mod I RPE 10/10 (6/30) Goal status: INITIAL  4.  Pt will demonstrate a good understanding of her diagnosis (FND/FMD) and resources available to her. Baseline:  Goal status: INITIAL    ASSESSMENT:  CLINICAL IMPRESSION: Emphasis of skilled PT session*** Continue POC.   OBJECTIVE IMPAIRMENTS: Abnormal gait, cardiopulmonary status limiting activity, decreased activity tolerance, decreased balance, decreased coordination, decreased endurance, decreased knowledge of condition, decreased knowledge of use of DME, decreased mobility, difficulty walking, impaired perceived functional ability, impaired sensation, improper body mechanics, and postural dysfunction.   ACTIVITY LIMITATIONS: carrying, lifting, bending, sitting, standing, squatting, sleeping, stairs, transfers, and bed mobility  PARTICIPATION LIMITATIONS: meal prep, cleaning, laundry, interpersonal relationship,  driving, shopping, community activity, and occupation  PERSONAL FACTORS: Time since onset of  injury/illness/exacerbation and 1-2 comorbidities: anxiety and focal dystonia are also affecting patient's functional outcome.   REHAB POTENTIAL: Good  CLINICAL DECISION MAKING: Evolving/moderate complexity  EVALUATION COMPLEXITY: High  PLAN:  PT FREQUENCY: 2x/week  PT DURATION: 6 weeks  PLANNED INTERVENTIONS: 02835- PT Re-evaluation, 97750- Physical Performance Testing, 97110-Therapeutic exercises, 97530- Therapeutic activity, 97112- Neuromuscular re-education, 97535- Self Care, 02859- Manual therapy, [phone]- Gait training, [phone]- Aquatic Therapy, [phone]- Electrical stimulation (manual), [phone] (1-2 muscles), 20561 (3+ muscles)- Dry Needling, Patient/Family education, Balance training, Stair training, Taping, Joint mobilization, Spinal mobilization, DME instructions, Cryotherapy, and Moist heat  PLAN FOR NEXT SESSION: assess seated BP, quadruped and tall kneel to work on proximal stability, assess hip abd strength, work on core strengthening, functional movements, resisted gait, blaze pods, cognitive dual tasking (particularly having to speak while doing a physical task), Yoga for functional stretching- warrior pose?, have we heard from providers?***   Waddell Southgate, PT Waddell Southgate, PT, DPT, CSRS   08/31/2023, 11:15 AM

## 2023-09-02 ENCOUNTER — Ambulatory Visit: Payer: Self-pay

## 2023-09-06 ENCOUNTER — Ambulatory Visit: Admitting: Speech Pathology

## 2023-09-06 NOTE — Therapy (Deleted)
 OUTPATIENT SPEECH LANGUAGE PATHOLOGY TREATMENT NOTE   Patient Name: Lauren Levy MRN: [id number] DOB:01-Feb-1971, 53 y.o., female Today's Date: 09/06/2023  PCP: Lauren Velna SAUNDERS, MD REFERRING PROVIDER: Vernon Velna SAUNDERS, MD  END OF SESSION:    Past Medical History:  Diagnosis Date   Ovarian cyst    Past Surgical History:  Procedure Laterality Date   BREAST BIOPSY Right 02/16/2021   papilloma   BREAST LUMPECTOMY Right 04/14/2021   BREAST LUMPECTOMY WITH RADIOACTIVE SEED LOCALIZATION Right 04/14/2021   Procedure: RIGHT BREAST LUMPECTOMY WITH RADIOACTIVE SEED LOCALIZATION;  Surgeon: Lauren Ned, MD;  Location: [facility];  Service: General;  Laterality: Right;   KNEE SURGERY Right    MINOR CARPAL TUNNEL Right    Patient Active Problem List   Diagnosis Date Noted   Anxiety 01/12/2022   Foot pain, right 01/12/2022   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  Rationale for Evaluation and Treatment: Rehabilitation  SUBJECTIVE:   SUBJECTIVE STATEMENT: ***  PERTINENT HISTORY: Pt reports that in 2023 she was given medication that triggered tardive dyskinesia and focal dystonia. She reports that her R foot used to turn in and she would have to walk on her toes because her toes would curl under due to this but she had knee surgery to correct it. She reports that recently though her movements have gotten worse, her whole body will tense up and she is unable to open her eyes and speak at the same time. She reports that speaking is very difficult, she finds herself holding her breath and feels like there is a weight sitting on her chest.   She also reports getting fatigued very easily, even just walking up the stairs at  home to get to her bedroom/bathroom she will find herself out of breath. Additionally, when she wakes up first thing in the the morning she does not have any of these symptoms, they come on gradually throughout the day. Consult with movement disorder specialist was negative.   PAIN:  Are you having pain? No  FALLS: Has patient fallen in last 6 months? No, Number of falls: See PT eval  LIVING ENVIRONMENT: Lives with: lives with their family and lives with an adult companion Lives in: House/apartment  PLOF:Level of assistance: Independent with ADLs, Independent with IADLs Employment: Part-time employment  PATIENT GOALS: To speak with ease  OBJECTIVE:  Note: Objective measures were completed at Evaluation unless otherwise noted.  DIAGNOSTIC FINDINGS: 2023 MRI noirmal  COGNITION: Overall cognitive status: Within functional limits for tasks assessed  SOCIAL HISTORY: Occupation: realtor part time Water intake: optimal Caffeine/alcohol intake: minimal Daily voice use: moderate  PERCEPTUAL VOICE ASSESSMENT: Voice quality: harsh, strained, and vocal fatigue Vocal abuse: n/a Resonance: normal Respiratory function: thoracic breathing and clavicular breathing  OBJECTIVE VOICE ASSESSMENT: Maximum phonation time for sustained ah: 5.14 Conversational loudness average: 73 dB Conversational loudness range: 72-74 dB S/z ratio: 1.09 (Suggestive of dysfunction >1.4)  PATIENT REPORTED OUTCOME MEASURES (PROM): VRQOL 32 Rated a 5 or as bad as it can be running out of air when talking, being less outgoing due to voice. A 4 , or a lot of problem speaking loudly, doing her profession because of her voice and avoiding going out socially because of  voice                                                                                                                            TREATMENT DATE:   09/06/23: ***  08/24/23: Completed PROM - see above. [name] reports success with SOVTE. Completed  parts of SOVTE with instruction to watch the clock to time 10 seconds in duration to train eyes open with phonation. Cues to complete accents, 1 per second watching the clock for eyes open. Initiated flow phonation stretch syllables watching amplitude of tissue displacement with airflow during phonation with syllables who, shoe, sue, phrases - she required occasional cues to maintain eyes open for visual feed back of tissue. Flow phrases focusing on  gently exaggerating flow phonemes to reduce tense pressed voice with rare min A and visual cues of flow sounds highlighted. After exercises, [name] conversed with eyes open 70% of utterances discussing her family and talking while showing me texts on her phone with success. Add stretch flow phonation with tissue to HEP.   08/17/23: Evaluation completed - Initiated HEP for muscle tension dysphonia - Trained in gargle 2 minutes prior to HEP. She completed Semi-occluded Vocal Tract Exercises (SOVTE) in water with straw - 10 second hum completed 7x with occasional min verbal cues and modeling. Pitch glides and accents completed with verbal cues and modeling. Song with usual mod A - Instructed her to watch clock and time 10 seconds with hum to successfully facilitate eyes open with phonation. Education re: tension in throat results in pressed voice.     PATIENT EDUCATION: Education details: HEP, pressed v s flow voice, mindfulness Person educated: Patient Education method: Explanation, Demonstration, and Verbal cues Education comprehension: verbalized understanding, returned demonstration, verbal cues required, and needs further education  HOME EXERCISE PROGRAM: Gargle, SOVTE in water  GOALS: Goals reviewed with patient? Yes  SHORT TERM GOALS: Target date: 09/20/13  Pt will complete HEP with rare min A Baseline: Goal status: ONGOING  2.  Pt will achieve clear phonation in structured speech exercises (resonant and flow) Baseline:  Goal status:  ONGOING  3.  Pt will speak with eyes open in structured speech tasks Baseline:  Goal status: ONGOING  4.  Pt will ID 2 triggers for tension dysphonia Baseline:  Goal status: ONGOING  5.  Pt will maintain clear phonation for 3 short turns in conversation with occasional min A Baseline:  Goal status: ONGOING    LONG TERM GOALS: Target date: 10/26/23  Pt will complete HEP for speech/voice with mod I Baseline:  Goal status: ONGOING  2.  Pt will maintain clear phonation and eyes open over 5 minute simple converseation Baseline:  Goal status: ONGOING  3.  Pt will eliminate extraneous oral, neck and body movements during 5 minute conversation Baseline:  Goal status: ONGOING  4.  Pt will improve score on PROM Baseline:  Goal status: ONGOING   ASSESSMENT:  CLINICAL IMPRESSION: Patient is a 53 y.o.  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 She presents with excess tensio and extraneous movements of body while speaking. She reports she is unable to keep her eyes open when she talks. She is able to speak over the phone better because she keeps her eyes closed and doesn't feel pressure to use eye contact. 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 Dyskinesias of face and body noted throughout evaluation. She was able to keep eyes open with phonation in HEP training. Speech dysfluent apporximately 25% of utterances with aytpical stutter and short 1-2 second blocks. Voice is strained with intermittent breaks/aphonia. I recommend skilled ST to maximize voice quality, intelligibility for safety, success at work and return to [employer]. SABRA   OBJECTIVE IMPAIRMENTS: include voice disorder and speech disorder. These impairments are limiting patient from return to work and effectively communicating at home and in community. Factors affecting potential to achieve goals and functional outcome are co-morbidities.. Patient will benefit from skilled SLP  services to address above impairments and improve overall function.  REHAB POTENTIAL: Good  PLAN:  SLP FREQUENCY: 1-2x/week  SLP DURATION: 10 weeks  PLANNED INTERVENTIONS: Environmental controls, Cueing hierachy, Cognitive reorganization, Internal/external aids, Functional tasks, Multimodal communication approach, SLP instruction and feedback, Compensatory strategies, Patient/family education, [phone] Treatment of speech (30 or 45 min) , and [id number]- Speech Eval Sound Prod, Articulate, Phonological    Harlene LITTIE Levy, CCC-SLP 09/06/2023, 10:01 AM

## 2023-09-07 ENCOUNTER — Ambulatory Visit: Payer: Self-pay

## 2023-09-07 ENCOUNTER — Ambulatory Visit: Admitting: Speech Pathology

## 2023-09-07 DIAGNOSIS — F5101 Primary insomnia: Secondary | ICD-10-CM | POA: Diagnosis not present

## 2023-09-07 DIAGNOSIS — F411 Generalized anxiety disorder: Secondary | ICD-10-CM | POA: Diagnosis not present

## 2023-09-07 DIAGNOSIS — M6289 Other specified disorders of muscle: Secondary | ICD-10-CM | POA: Diagnosis not present

## 2023-09-07 DIAGNOSIS — F444 Conversion disorder with motor symptom or deficit: Secondary | ICD-10-CM | POA: Diagnosis not present

## 2023-09-07 DIAGNOSIS — G248 Other dystonia: Secondary | ICD-10-CM | POA: Diagnosis not present

## 2023-09-07 DIAGNOSIS — G2401 Drug induced subacute dyskinesia: Secondary | ICD-10-CM | POA: Diagnosis not present

## 2023-09-09 ENCOUNTER — Ambulatory Visit: Payer: Self-pay | Attending: Neurology

## 2023-09-09 DIAGNOSIS — R4789 Other speech disturbances: Secondary | ICD-10-CM | POA: Insufficient documentation

## 2023-09-09 DIAGNOSIS — R2689 Other abnormalities of gait and mobility: Secondary | ICD-10-CM | POA: Insufficient documentation

## 2023-09-09 DIAGNOSIS — R2681 Unsteadiness on feet: Secondary | ICD-10-CM | POA: Insufficient documentation

## 2023-09-14 ENCOUNTER — Ambulatory Visit: Admitting: Speech Pathology

## 2023-09-14 ENCOUNTER — Ambulatory Visit: Payer: Self-pay | Admitting: Physical Therapy

## 2023-09-14 ENCOUNTER — Encounter: Payer: Self-pay | Admitting: Speech Pathology

## 2023-09-14 VITALS — BP 153/102 | HR 98

## 2023-09-14 DIAGNOSIS — R2681 Unsteadiness on feet: Secondary | ICD-10-CM

## 2023-09-14 DIAGNOSIS — R4789 Other speech disturbances: Secondary | ICD-10-CM

## 2023-09-14 DIAGNOSIS — R2689 Other abnormalities of gait and mobility: Secondary | ICD-10-CM

## 2023-09-14 NOTE — Therapy (Signed)
 OUTPATIENT SPEECH LANGUAGE PATHOLOGY TREATMENT NOTE   Patient Name: Lauren Levy MRN: [id number] DOB:01/10/1971, 53 y.o., female Today's Date: 09/19/2023  PCP: Lauren Velna SAUNDERS, MD REFERRING PROVIDER: Vernon Velna SAUNDERS, MD  END OF SESSION:     Past Medical History:  Diagnosis Date   Ovarian cyst    Past Surgical History:  Procedure Laterality Date   BREAST BIOPSY Right 02/16/2021   papilloma   BREAST LUMPECTOMY Right 04/14/2021   BREAST LUMPECTOMY WITH RADIOACTIVE SEED LOCALIZATION Right 04/14/2021   Procedure: RIGHT BREAST LUMPECTOMY WITH RADIOACTIVE SEED LOCALIZATION;  Surgeon: Lauren Ned, MD;  Location: [facility];  Service: General;  Laterality: Right;   KNEE SURGERY Right    MINOR CARPAL TUNNEL Right    Patient Active Problem List   Diagnosis Date Noted   Anxiety 01/12/2022   Foot pain, right 01/12/2022   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 She reports that her R foot used to turn in and she would have to walk on her toes because her toes would curl under due to this but she had knee surgery to correct it. She reports that recently though her movements have gotten worse, her whole body will tense up and she is unable to open her eyes and speak at the same time. She reports that speaking is very difficult, she finds herself holding her breath and feels like there is a weight sitting on her chest.   She also reports  getting fatigued very easily, even just walking up the stairs at home to get to her bedroom/bathroom she will find herself out of breath. Additionally, when she wakes up first thing in the the morning she does not have any of these symptoms, they come on gradually throughout the day. Consult with movement disorder specialist was negative.   PAIN:  Are you having pain? No  FALLS: Has patient fallen in last 6 months? No, Number of falls: See PT eval  LIVING ENVIRONMENT: Lives with: lives with their family and lives with an adult companion Lives in: House/apartment  PLOF:Level of assistance: Independent with ADLs, Independent with IADLs Employment: Part-time employment  PATIENT GOALS: To speak with ease  OBJECTIVE:  Note: Objective measures were completed at Evaluation unless otherwise noted.  DIAGNOSTIC FINDINGS: 2023 MRI noirmal  COGNITION: Overall cognitive status: Within functional limits for tasks assessed  SOCIAL HISTORY: Occupation: realtor part time Water intake: optimal Caffeine/alcohol intake: minimal Daily voice use: moderate  PERCEPTUAL VOICE ASSESSMENT: Voice quality: harsh, strained, and vocal fatigue Vocal abuse: n/a Resonance: normal Respiratory function: thoracic breathing and clavicular breathing  OBJECTIVE VOICE ASSESSMENT: Maximum phonation time for sustained ah: 5.14 Conversational loudness average: 73 dB Conversational loudness range: 72-74 dB S/z ratio: 1.09 (Suggestive of dysfunction >1.4)  PATIENT REPORTED OUTCOME MEASURES (PROM): VRQOL 32 Rated a 5 or as bad as it can be running out of air when talking, being less outgoing due to voice. A 4 , or a lot of problem speaking loudly, doing her profession  because of her voice and avoiding going out socially because of voice                                                                                                                            TREATMENT DATE:   09/14/23: [name] reports she has a  letter allowing her to do PT with elevated blood pressure - she does some mindfulness but this is not helping with symptoms. Utilized cutting out aphasia ID cards which did suppress her dystonia and dyskinesia while she was cutting - when she started talking and stopped cutting, mild dyskinesia returned. Targeted sustaining eyes open with hum/sustained phonation for average of 10 seconds over 4 trials, progressed to sustained vowels /I/, /u/for average of 6 seconds with eyes open 3 trials for each vowel. Progressed to cv syllables ma, may, me , my sustaining both phonemes with eyes open 5/5 trials each. Combined nasal syllables to combine 3  - repeating 3 syllalbes 3x with eyes open and occasional min verbal cues. Pt then initiated trial of taking 4 turns in conversation with eyes open for short 5-7 word utterances with occasional min A. Of note, dyskinesias reduced when focus on eyes open during non speech and speech tasks. HEP provided of tasks completed today.   08/24/23: Completed PROM - see above. [name] reports success with SOVTE. Completed parts of SOVTE with instruction to watch the clock to time 10 seconds in duration to train eyes open with phonation. Cues to complete accents, 1 per second watching the clock for eyes open. Initiated flow phonation stretch syllables watching amplitude of tissue displacement with airflow during phonation with syllables who, shoe, sue, phrases - she required occasional cues to maintain eyes open for visual feed back of tissue. Flow phrases focusing on  gently exaggerating flow phonemes to reduce tense pressed voice with rare min A and visual cues of flow sounds highlighted. After exercises, [name] conversed with eyes open 70% of utterances discussing her family and talking while showing me texts on her phone with success. Add stretch flow phonation with tissue to HEP.   08/17/23: Evaluation completed - Initiated HEP for muscle tension dysphonia - Trained in gargle 2 minutes  prior to HEP. She completed Semi-occluded Vocal Tract Exercises (SOVTE) in water with straw - 10 second hum completed 7x with occasional min verbal cues and modeling. Pitch glides and accents completed with verbal cues and modeling. Song with usual mod A - Instructed her to watch clock and time 10 seconds with hum to successfully facilitate eyes open with phonation. Education re: tension in throat results in pressed voice.     PATIENT EDUCATION: Education details: HEP, pressed v s flow voice, mindfulness Person educated: Patient Education method: Explanation, Demonstration, and Verbal cues Education comprehension: verbalized understanding, returned demonstration, verbal cues required, and needs further education  HOME EXERCISE PROGRAM: Gargle, SOVTE in water  GOALS: Goals reviewed with patient? Yes  SHORT TERM GOALS: Target date: 09/20/13  Pt will complete HEP with rare min A Baseline: Goal status: ONGOING  2.  Pt will achieve clear phonation in structured speech exercises (resonant and flow) Baseline:  Goal status: ONGOING  3.  Pt will speak with eyes open in structured speech tasks Baseline:  Goal status: ONGOING  4.  Pt will ID 2 triggers for tension dysphonia Baseline:  Goal status: ONGOING  5.  Pt will maintain clear phonation for 3 short turns in conversation with occasional min A Baseline:  Goal status: ONGOING    LONG TERM GOALS: Target date: 10/26/23  Pt will complete HEP for speech/voice with mod I Baseline:  Goal status: ONGOING  2.  Pt will maintain clear phonation and eyes open over 5 minute simple converseation Baseline:  Goal status: ONGOING  3.  Pt will eliminate extraneous oral, neck and body movements during 5 minute conversation Baseline:  Goal status: ONGOING  4.  Pt will improve score on PROM Baseline:  Goal status: ONGOING   ASSESSMENT:  CLINICAL IMPRESSION: Patient is a 53 y.o. female who was seen today for moderate functional  speech and voice disorder. She presents with excess tensio and extraneous movements of body while speaking. She reports she is unable to keep her eyes open when she talks. She is able to speak over the phone better because she keeps her eyes closed and doesn't feel pressure to use eye contact. 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 Dyskinesias of face and body noted throughout evaluation. She was able to keep eyes open with phonation in HEP training. Speech dysfluent apporximately 25% of utterances with aytpical stutter and short 1-2 second blocks. Voice is strained with intermittent breaks/aphonia. I recommend skilled ST to maximize voice quality, intelligibility for safety, success at work and return to [employer] [facility] [employer]. SABRA   OBJECTIVE IMPAIRMENTS: include voice disorder and speech disorder. These impairments are limiting patient from return to work and effectively communicating at home and in community. Factors affecting potential to achieve goals and functional outcome are co-morbidities.. Patient will benefit from skilled SLP services to address above impairments and improve overall function.  REHAB POTENTIAL: Good  PLAN:  SLP FREQUENCY: 1-2x/week  SLP DURATION: 10 weeks  PLANNED INTERVENTIONS: Environmental controls, Cueing hierachy, Cognitive reorganization, Internal/external aids, Functional tasks, Multimodal communication approach, SLP instruction and feedback, Compensatory strategies, Patient/family education, [phone] Treatment of speech (30 or 45 min) , and 07477- Speech Eval Sound Prod, Articulate, Phonological    Dim Meisinger, Leita Caldron, CCC-SLP 09/19/2023, 8:31 AM

## 2023-09-14 NOTE — Patient Instructions (Signed)
   Relaxed - your brain tells your eyes to stay open  Mmmmm 5x  Meeeeee  Mooowwww  Mooooo  Myyyy  Me, moo, my 3x  May, Mow, Moo 3x  Say 3-4 sentences - relaxed body and face - eyes open  - even if it is talking to wall  Practice talking 5 minutes to another - eyes open   Remember we are forming a new brain pathway and suppressing a maladaptive brain pathway

## 2023-09-14 NOTE — Therapy (Incomplete)
 OUTPATIENT PHYSICAL THERAPY NEURO TREATMENT - RECERTIFICATION***   Patient Name: Lauren Levy MRN: [id number] DOB:Nov 26, 1970, 53 y.o., female Today's Date: 09/14/2023   PCP: Vernon Velna SAUNDERS, MD REFERRING PROVIDER: Hans Cinderella Duck, MD  END OF SESSION:  PT End of Session - 09/14/23 1407     Visit Number 12    Number of Visits 13    Date for PT Re-Evaluation 09/09/23    Authorization Type Aet[employer]    PT Start Time 1407   from Speech Therapy   PT Stop Time 1445    PT Time Calculation (min) 38 min    Equipment Utilized During Treatment Gait belt    Activity Tolerance Other (comment);Patient tolerated treatment well   HTN, improves as session progresses   Behavior During Therapy [facility] for tasks assessed/performed                Past Medical History:  Diagnosis Date   Ovarian cyst    Past Surgical History:  Procedure Laterality Date   BREAST BIOPSY Right 02/16/2021   papilloma   BREAST LUMPECTOMY Right 04/14/2021   BREAST LUMPECTOMY WITH RADIOACTIVE SEED LOCALIZATION Right 04/14/2021   Procedure: RIGHT BREAST LUMPECTOMY WITH RADIOACTIVE SEED LOCALIZATION;  Surgeon: Vanderbilt Ned, MD;  Location: [facility];  Service: General;  Laterality: Right;   KNEE SURGERY Right    MINOR CARPAL TUNNEL Right    Patient Active Problem List   Diagnosis Date Noted   Anxiety 01/12/2022   Foot pain, right 01/12/2022   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   Dystonia 06/03/2021   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 She reports that she is feeling tired today after speech therapy, didn't realize it would be such hard work. She is working on keeping her eyes open during conversation.  No falls since last visit. She reports her PCP did change some of her medications: not to take anything except her propanolol and diazepam . She is taking the propanolol every morning and just the diazepam  as needed, did take both medications this  morning before therapy.  Pt was also referred to [facility], has an appt 9/24, with a physician who specializes in movement disorders.   Pt accompanied by: self  PERTINENT HISTORY: PMH: anxiety, focal dystonia  PAIN:  Are you having pain? No  PRECAUTIONS: None  PATIENT GOALS: see if we can help me talk without closing my eyes, strengthen my back so that I can sit like normal people (slides out of chair when sitting normally), help with my walking                                                                                                                               TREATMENT  Theract: There were no vitals filed for this visit.  Assessed in LUE in sitting at rest, 2 min break in between readings: 177/122, HR 98 151/104, HR 93 Reassessed in RUE in sitting at rest after 2 min break: 153/102  Pt's PCP did sent a letter to patient's physical therapy team indicating medical necessity of continuing PT and her recommendation that patient continue with PT, however no BP parameters given. Letter and appointment note from 09/08/2023 scanned into patient's chart.***   [facility] PT Assessment - 09/14/23 1427       Ambulation/Gait   Gait velocity 32.8 ft over 12.8 sec = 2.56 ft/sec   no AD        : 323 ft no AD, one standing rest break, RPE 20/10  Seated quad and glute isometrics due to R quads tightening up during gait speed  assessment.   PATIENT EDUCATION: Education details: see above*** Person educated: Patient Education method: Medical illustrator Education comprehension: verbalized understanding, returned demonstration, and needs further education  HOME EXERCISE PROGRAM: -sit at dinning table for 1 meal with upright posture picturing self in mirror  Start with trying to sit upright x 15 sec without changing position, add 15 sec as this gets easier -work on sit to stands in front of full-length mirror at home, keep eyes open Add in counting up by 1's as this gets easier  -sidelying quad stretch 3 x 30 sec each B (verbally added 6/30, pt declines handout)  Access Code: [email or web address] URL: [email or web address] Date: 08/17/2023 Prepared by: Waddell Southgate  Exercises - Supine Bridge  - 1 x daily - 7 x weekly - 3 sets - 10 reps - Supine Quadricep Sets  - 1 x daily - 7 x weekly - 3 sets - 10 reps - Seated Hip Adduction Isometrics with Ball  - 1 x daily - 7 x weekly - 3 sets - 10 reps - 5 sec hold  GOALS: Goals reviewed with patient? Yes  SHORT TERM GOALS: Target date: 08/08/2023  Pt will be independent with initial HEP for improved strength, balance, transfers and gait. Baseline: Goal status: MET  2.  Pt will improve gait velocity to at least 3.0 ft/sec for improved gait efficiency and performance at SBA level  Baseline: 2.7 ft/sec  SBA no AD (6/6), 3.36 ft/sec mod I no AD (6/30) Goal status: MET  3.  Pt will ambulate greater than or equal to 350 feet on with LRAD and SBA for improved cardiovascular endurance and BLE strength.  Baseline: 283 ft no AD SBA RPE 10/10 (6/6), 366 ft no AD mod I RPE 10/10 (6/30) Goal status: MET    LONG TERM GOALS: Target date: 08/29/2023   Pt will be independent with final HEP for improved strength, balance, transfers and gait. Baseline:  Goal status: INITIAL  2.  Pt will improve gait velocity to at least 3.5 ft/sec for improved gait  efficiency and performance at mod I level  Baseline: 2.7 ft/sec SBA no AD (6/6), 3.36 ft/sec mod I no AD (6/30) Goal status: REVISED/UPGRADED  3.  Pt will ambulate greater than or equal to 450 feet on with LRAD and SBA for improved cardiovascular endurance and BLE strength.  Baseline: 283 ft no AD SBA RPE 10/10 (6/6), 366 ft no AD mod I RPE 10/10 (6/30) Goal status: INITIAL  4.  Pt will demonstrate a good understanding of her diagnosis (FND/FMD) and resources available to her. Baseline:  Goal status: INITIAL   NEW SHORT TERM GOALS:   Target date: {follow up:25551}  *** Baseline: *** Goal status: {GOALSTATUS:25110}  2.  *** Baseline: *** Goal status: {GOALSTATUS:25110}  3.  *** Baseline: *** Goal status: {GOALSTATUS:25110}  4.  *** Baseline: *** Goal status: {GOALSTATUS:25110}  5.  *** Baseline: *** Goal status: {GOALSTATUS:25110}  6.  *** Baseline: *** Goal status: {GOALSTATUS:25110}   NEW LONG TERM GOALS:  Target date: {follow up:25551}  *** Baseline: *** Goal status: {GOALSTATUS:25110}  2.  *** Baseline: *** Goal status: {GOALSTATUS:25110}  3.  *** Baseline: *** Goal status: {GOALSTATUS:25110}  4.  *** Baseline: *** Goal status: {GOALSTATUS:25110}  5.  *** Baseline: *** Goal status: {GOALSTATUS:25110}  6.  *** Baseline: *** Goal status: {GOALSTATUS:25110}     ASSESSMENT:  CLINICAL IMPRESSION: Emphasis of skilled PT session*** Continue POC.   OBJECTIVE IMPAIRMENTS: Abnormal gait, cardiopulmonary status limiting activity, decreased activity tolerance, decreased balance, decreased coordination, decreased endurance, decreased knowledge of condition, decreased knowledge of use of DME, decreased mobility, difficulty walking, impaired perceived functional ability, impaired sensation, improper body mechanics, and postural dysfunction.   ACTIVITY LIMITATIONS: carrying, lifting, bending, sitting, standing, squatting, sleeping, stairs,  transfers, and bed mobility  PARTICIPATION LIMITATIONS: meal prep, cleaning, laundry, interpersonal relationship, driving, shopping, community activity, and occupation  PERSONAL FACTORS: Time since onset of injury/illness/exacerbation and 1-2 comorbidities: anxiety and focal dystonia are also affecting patient's functional outcome.   REHAB POTENTIAL: Good  CLINICAL DECISION MAKING: Evolving/moderate complexity  EVALUATION COMPLEXITY: High  PLAN:  PT FREQUENCY: 2x/week  PT DURATION: 6 weeks  PLANNED INTERVENTIONS: 02835- PT Re-evaluation, 97750- Physical Performance Testing, 97110-Therapeutic exercises, 97530- Therapeutic activity, 97112- Neuromuscular re-education, 97535- Self Care, 02859- Manual therapy, [phone]- Gait training, [phone]- Aquatic Therapy, [phone]- Electrical stimulation (manual), [phone] (1-2 muscles), 20561 (3+ muscles)- Dry Needling, Patient/Family education, Balance training, Stair training, Taping, Joint mobilization, Spinal mobilization, DME instructions, Cryotherapy, and Moist heat  PLAN FOR NEXT SESSION: assess seated BP, quadruped and tall kneel to work on proximal stability, assess hip abd strength, work on core strengthening, functional movements, resisted gait, blaze pods, cognitive dual tasking (particularly having to speak while doing a physical task), Yoga for functional stretching- warrior pose?, have we heard from providers?***   Waddell Southgate, PT Waddell Southgate, PT, DPT, CSRS   09/14/2023, 2:47 PM

## 2023-09-16 ENCOUNTER — Ambulatory Visit: Payer: Self-pay | Admitting: Physical Therapy

## 2023-09-16 VITALS — BP 139/108 | HR 91

## 2023-09-16 DIAGNOSIS — R4789 Other speech disturbances: Secondary | ICD-10-CM | POA: Diagnosis not present

## 2023-09-16 DIAGNOSIS — R2689 Other abnormalities of gait and mobility: Secondary | ICD-10-CM | POA: Diagnosis not present

## 2023-09-16 DIAGNOSIS — R2681 Unsteadiness on feet: Secondary | ICD-10-CM | POA: Diagnosis not present

## 2023-09-16 NOTE — Therapy (Signed)
 OUTPATIENT PHYSICAL THERAPY NEURO TREATMENT   Patient Name: Lauren Levy MRN: [id number] DOB:[birth year], 53 y.o., female Today's Date: 09/16/2023   PCP: Vernon Velna SAUNDERS, MD REFERRING PROVIDER: Hans Cinderella Duck, MD/ recert sent to PCP  END OF SESSION:  PT End of Session - 09/16/23 1154     Visit Number 13    Number of Visits 28   recert   Date for PT Re-Evaluation 11/24/23   recert   Authorization Type Aetna    Authorization Time Period VL:MN    PT Start Time 1152   pt arrived late   PT Stop Time 1230    PT Time Calculation (min) 38 min    Equipment Utilized During Treatment Gait belt    Activity Tolerance Other (comment);Patient tolerated treatment well   HTN, improves as session progresses   Behavior During Therapy [facility] for tasks assessed/performed                 Past Medical History:  Diagnosis Date   Ovarian cyst    Past Surgical History:  Procedure Laterality Date   BREAST BIOPSY Right 02/16/2021   papilloma   BREAST LUMPECTOMY Right 04/14/2021   BREAST LUMPECTOMY WITH RADIOACTIVE SEED LOCALIZATION Right 04/14/2021   Procedure: RIGHT BREAST LUMPECTOMY WITH RADIOACTIVE SEED LOCALIZATION;  Surgeon: Vanderbilt Ned, MD;  Location: [facility];  Service: General;  Laterality: Right;   KNEE SURGERY Right    MINOR CARPAL TUNNEL Right    Patient Active Problem List   Diagnosis Date Noted   Anxiety 01/12/2022   Foot pain, right 01/12/2022   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   Dystonia 06/03/2021   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   Gait disturbance 10/09/2020    ONSET DATE: 06/29/2023  REFERRING DIAG: R26.9 (ICD-10-CM) - Unspecified abnormalities of gait and mobility  THERAPY DIAG:  Unsteadiness on feet  Other abnormalities of gait and mobility  Rationale for Evaluation and Treatment: Rehabilitation  SUBJECTIVE:                                                                                                                                                                                              SUBJECTIVE STATEMENT:  Pt denies any acute changes since last visit, she has been monitoring her BP at home and numbers have been good. She is able to demonstrate running as she enters her appointment today.  Pt was also referred to [facility], has an appt 9/24, with a physician who specializes in movement disorders.  Pt accompanied by: self  PERTINENT HISTORY: PMH: anxiety, focal dystonia  PAIN:  Are you having pain? No  PRECAUTIONS:  None  PATIENT GOALS: see if we can help me talk without closing my eyes, strengthen my back so that I can sit like normal people (slides out of chair when sitting normally), help with my walking                                                                                                                               TREATMENT  Self-Care/Home Management Vitals:   09/16/23 1256 09/16/23 1257  BP: (!) 150/118 (!) 139/108  Pulse: 98 91    Assessed BP in LUE in sitting at rest, 2 min break in between readings: 150/118, HR 98 139/108, HR 91   Vitals determined to be within safe limits for participation in physical therapy session this date after seated rest break. Her BP does remain elevated but she has taken BP medication and anti-anxiety medication and her PCP is aware of elevated readings. This therapist comfortable with proceeding with session after 2nd reading obtained.  NMR 5 Blaze pods on random setting for improved proximal strengthening and forcing patient to keep eyes open while performing a physical task.  Performed on 1 minute intervals with 30 rest periods.  Pods placed in semi-circle configuration on mat table with patient in quadruped. Round 1:   44 hits. Round 2:   42 hits. Round 3:   45 hits. Notable errors/deficits:  difficulty keeping eyes open during physical tasks Distracting color with focus on hitting blue Blaze pod x 2:30 min Gait x 230 ft performing  visual scanning L/R to name the color of Jumbo-sized cards Pt has to take several standing rest breaks due to fatigue and R quad cramping Gait x 230 ft performing ball toss to self Pt exhibits mild pelvic rotation but overall improved gait mechanics during this    PATIENT EDUCATION: Education details: ongoing education regarding BP readings and despite patient's PCP sending letter to continue PT the therapist will determine if they are comfortable proceeding with session based on BP readings Education method: Explanation and Demonstration Education comprehension: verbalized understanding, returned demonstration, and needs further education  HOME EXERCISE PROGRAM: -sit at dinning table for 1 meal with upright posture picturing self in mirror  Start with trying to sit upright x 15 sec without changing position, add 15 sec as this gets easier -work on sit to stands in front of full-length mirror at home, keep eyes open Add in counting up by 1's as this gets easier  -sidelying quad stretch 3 x 30 sec each B (verbally added 6/30, pt declines handout)  Access Code: [email or web address] URL: [email or web address] Date: 08/17/2023 Prepared by: Waddell Southgate  Exercises - Supine Bridge  - 1 x daily - 7 x weekly - 3 sets - 10 reps - Supine Quadricep Sets  - 1 x daily - 7 x weekly - 3 sets - 10 reps - Seated Hip Adduction Isometrics with Ball  - 1 x daily - 7 x weekly - 3 sets -  10 reps - 5 sec hold  GOALS: Goals reviewed with patient? Yes  SHORT TERM GOALS: Target date: 08/08/2023  Pt will be independent with initial HEP for improved strength, balance, transfers and gait. Baseline: Goal status: MET  2.  Pt will improve gait velocity to at least 3.0 ft/sec for improved gait efficiency and performance at SBA level  Baseline: 2.7 ft/sec SBA no AD (6/6), 3.36 ft/sec mod I no AD (6/30) Goal status: MET  3.  Pt will ambulate greater than or equal to 350 feet on with LRAD and SBA for  improved cardiovascular endurance and BLE strength.  Baseline: 283 ft no AD SBA RPE 10/10 (6/6), 366 ft no AD mod I RPE 10/10 (6/30) Goal status: MET    LONG TERM GOALS: Target date: 08/29/2023   Pt will be independent with final HEP for improved strength, balance, transfers and gait. Baseline:  Goal status: IN PROGRESS  2.  Pt will improve gait velocity to at least 3.5 ft/sec for improved gait efficiency and performance at mod I level  Baseline: 2.7 ft/sec SBA no AD (6/6), 3.36 ft/sec mod I no AD (6/30), 2.56 ft/sec mod I no AD (8/6) Goal status: NOT MET  3.  Pt will ambulate greater than or equal to 450 feet on with LRAD and SBA for improved cardiovascular endurance and BLE strength.  Baseline: 283 ft no AD SBA RPE 10/10 (6/6), 366 ft no AD mod I RPE 10/10 (6/30), 323 ft no AD mod I RPE 20/10 (8/6) Goal status: NOT MET  4.  Pt will demonstrate a good understanding of her diagnosis (FND/FMD) and resources available to her. Baseline: received a referral to [facility] movement disorder specialist - appt 9/24 Goal status: NOT MET   NEW SHORT TERM GOALS:   Target date: 10/13/2023   Pt will be independent with progressive HEP for improved strength, balance, transfers and gait. Baseline:  Goal status: IN PROGRESS  2.  Pt will improve gait velocity to at least 3.0 ft/sec for improved gait efficiency and performance at mod I level  Baseline: 2.7 ft/sec SBA no AD (6/6), 3.36 ft/sec mod I no AD (6/30), 2.56 ft/sec mod I no AD (8/6) Goal status: REVISED/DOWNGRADED  3.  Pt will ambulate greater than or equal to 400 feet on with LRAD and SBA for improved cardiovascular endurance and BLE strength.  Baseline: 283 ft no AD SBA RPE 10/10 (6/6), 366 ft no AD mod I RPE 10/10 (6/30), 323 ft no AD mod I RPE 20/10 (8/6) Goal status: REVISED/DOWNGRADED  4.  Pt will demonstrate good understanding of at least 3 stress relief/stress management techniques in order to better manage her  anxiety and lower her blood pressure. Baseline:  Goal status: INITIAL   NEW LONG TERM GOALS:  Target date: 11/10/2023   Pt will be independent with final HEP for improved strength, balance, transfers and gait. Baseline:  Goal status: IN PROGRESS  2.  Pt will improve gait velocity to at least 3.5 ft/sec for improved gait efficiency and performance at mod I level  Baseline: 2.7 ft/sec SBA no AD (6/6), 3.36 ft/sec mod I no AD (6/30), 2.56 ft/sec mod I no AD (8/6) Goal status: IN PROGRESS  3.  Pt will ambulate greater than or equal to 450 feet on with LRAD and SBA for improved cardiovascular endurance and BLE strength.  Baseline: 283 ft no AD SBA RPE 10/10 (6/6), 366 ft no AD mod I RPE 10/10 (6/30), 323  ft no AD mod I RPE 20/10 (8/6) Goal status: IN PROGRESS  4.  Pt will demonstrate a good understanding of her diagnosis (FND/FMD) and resources available to her. Baseline: received a referral to [facility] movement disorder specialist - appt 9/24 Goal status: IN PROGRESS     ASSESSMENT:  CLINICAL IMPRESSION: Emphasis of skilled PT session on continuing to monitor vitals, working on proximal strengthening in quadruped position, working on keeping eyes open during functional tasks, and working on normalizing gait pattern. Pt continues to present initially with significantly elevated BP (though improved as compared to previous session) that does improve with seated rest break in a quiet environment. She also continues to struggle to keep her eyes open while engaging in conversation or engaging in physical tasks though this is improving. She continues to benefit from skilled PT services to work towards improving her function and normalizing her movement patterns. Continue POC.   OBJECTIVE IMPAIRMENTS: Abnormal gait, cardiopulmonary status limiting activity, decreased activity tolerance, decreased balance, decreased coordination, decreased endurance, decreased knowledge of condition,  decreased knowledge of use of DME, decreased mobility, difficulty walking, impaired perceived functional ability, impaired sensation, improper body mechanics, and postural dysfunction.   ACTIVITY LIMITATIONS: carrying, lifting, bending, sitting, standing, squatting, sleeping, stairs, transfers, and bed mobility  PARTICIPATION LIMITATIONS: meal prep, cleaning, laundry, interpersonal relationship, driving, shopping, community activity, and occupation  PERSONAL FACTORS: Time since onset of injury/illness/exacerbation and 1-2 comorbidities: anxiety and focal dystonia are also affecting patient's functional outcome.   REHAB POTENTIAL: Good  CLINICAL DECISION MAKING: Evolving/moderate complexity  EVALUATION COMPLEXITY: High  PLAN:  PT FREQUENCY: 2x/week  PT DURATION: 6 weeks  PLANNED INTERVENTIONS: 02835- PT Re-evaluation, 97750- Physical Performance Testing, 97110-Therapeutic exercises, 97530- Therapeutic activity, 97112- Neuromuscular re-education, 97535- Self Care, 02859- Manual therapy, [phone]- Gait training, [phone]- Aquatic Therapy, [phone]- Electrical stimulation (manual), [phone] (1-2 muscles), 20561 (3+ muscles)- Dry Needling, Patient/Family education, Balance training, Stair training, Taping, Joint mobilization, Spinal mobilization, DME instructions, Cryotherapy, and Moist heat  PLAN FOR NEXT SESSION: assess seated BP, quadruped and tall kneel to work on proximal stability, assess hip abd strength, work on core strengthening, functional movements, resisted gait, blaze pods, cognitive dual tasking (particularly having to speak while doing a physical task), Yoga for functional stretching- warrior pose?   Lawarence Meek, PT Waddell Southgate, PT, DPT, CSRS   09/16/2023, 12:56 PM

## 2023-09-19 ENCOUNTER — Ambulatory Visit: Payer: Self-pay | Admitting: Physical Therapy

## 2023-09-19 NOTE — Therapy (Incomplete)
 OUTPATIENT PHYSICAL THERAPY NEURO TREATMENT   Patient Name: Lauren Levy MRN: [id number] DOB:[birth year], 53 y.o., female Today's Date: 09/19/2023   PCP: Vernon Velna SAUNDERS, MD REFERRING PROVIDER: Hans Cinderella Duck, MD/ recert sent to PCP  END OF SESSION:           Past Medical History:  Diagnosis Date   Ovarian cyst    Past Surgical History:  Procedure Laterality Date   BREAST BIOPSY Right 02/16/2021   papilloma   BREAST LUMPECTOMY Right 04/14/2021   BREAST LUMPECTOMY WITH RADIOACTIVE SEED LOCALIZATION Right 04/14/2021   Procedure: RIGHT BREAST LUMPECTOMY WITH RADIOACTIVE SEED LOCALIZATION;  Surgeon: Vanderbilt Ned, MD;  Location: [facility];  Service: General;  Laterality: Right;   KNEE SURGERY Right    MINOR CARPAL TUNNEL Right    Patient Active Problem List   Diagnosis Date Noted   Anxiety 01/12/2022   Foot pain, right 01/12/2022   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  Rationale for Evaluation and Treatment: Rehabilitation  SUBJECTIVE:                                                                                                                                                                                             SUBJECTIVE STATEMENT:  Pt denies any acute changes since last visit, she has been monitoring her BP at home and numbers have been good. She is able to demonstrate running as she enters her appointment today. ***  Pt was also referred to [facility], has an appt 9/24, with a physician who specializes in movement disorders.  Pt accompanied by: self  PERTINENT HISTORY: PMH: anxiety, focal dystonia  PAIN:  Are you having pain? No  PRECAUTIONS: None  PATIENT GOALS: see if we can help me  talk without closing my eyes, strengthen my back so that I can sit like normal people (slides out of chair when sitting normally), help with my walking  TREATMENT  Self-Care/Home Management There were no vitals filed for this visit.   Assessed BP in LUE in sitting at rest, 2 min break in between readings: 150/118, HR 98 139/108, HR 91   Vitals determined to be within safe limits for participation in physical therapy session this date after seated rest break. Her BP does remain elevated but she has taken BP medication and anti-anxiety medication and her PCP is aware of elevated readings. This therapist comfortable with proceeding with session after 2nd reading obtained.  NMR 5 Blaze pods on random setting for improved proximal strengthening and forcing patient to keep eyes open while performing a physical task.  Performed on 1 minute intervals with 30 rest periods.  Pods placed in semi-circle configuration on mat table with patient in quadruped. Round 1:   44 hits. Round 2:   42 hits. Round 3:   45 hits. Notable errors/deficits:  difficulty keeping eyes open during physical tasks Distracting color with focus on hitting blue Blaze pod x 2:30 min Gait x 230 ft performing visual scanning L/R to name the color of Jumbo-sized cards Pt has to take several standing rest breaks due to fatigue and R quad cramping Gait x 230 ft performing ball toss to self Pt exhibits mild pelvic rotation but overall improved gait mechanics during this ***    PATIENT EDUCATION: Education details: ongoing education regarding BP readings and despite patient's PCP sending letter to continue PT the therapist will determine if they are comfortable proceeding with session based on BP readings*** Education method: Explanation and Demonstration Education comprehension: verbalized  understanding, returned demonstration, and needs further education  HOME EXERCISE PROGRAM: -sit at dinning table for 1 meal with upright posture picturing self in mirror  Start with trying to sit upright x 15 sec without changing position, add 15 sec as this gets easier -work on sit to stands in front of full-length mirror at home, keep eyes open Add in counting up by 1's as this gets easier  -sidelying quad stretch 3 x 30 sec each B (verbally added 6/30, pt declines handout)  Access Code: [email or web address] URL: [email or web address] Date: 08/17/2023 Prepared by: Waddell Southgate  Exercises - Supine Bridge  - 1 x daily - 7 x weekly - 3 sets - 10 reps - Supine Quadricep Sets  - 1 x daily - 7 x weekly - 3 sets - 10 reps - Seated Hip Adduction Isometrics with Ball  - 1 x daily - 7 x weekly - 3 sets - 10 reps - 5 sec hold  GOALS: Goals reviewed with patient? Yes  SHORT TERM GOALS: Target date: 08/08/2023  Pt will be independent with initial HEP for improved strength, balance, transfers and gait. Baseline: Goal status: MET  2.  Pt will improve gait velocity to at least 3.0 ft/sec for improved gait efficiency and performance at SBA level  Baseline: 2.7 ft/sec SBA no AD (6/6), 3.36 ft/sec mod I no AD (6/30) Goal status: MET  3.  Pt will ambulate greater than or equal to 350 feet on with LRAD and SBA for improved cardiovascular endurance and BLE strength.  Baseline: 283 ft no AD SBA RPE 10/10 (6/6), 366 ft no AD mod I RPE 10/10 (6/30) Goal status: MET    LONG TERM GOALS: Target date: 08/29/2023   Pt will be independent with final HEP for improved strength, balance, transfers and gait. Baseline:  Goal status: IN PROGRESS  2.  Pt will improve gait velocity to at least 3.5 ft/sec  for improved gait efficiency and performance at mod I level  Baseline: 2.7 ft/sec SBA no AD (6/6), 3.36 ft/sec mod I no AD (6/30), 2.56 ft/sec mod I no AD (8/6) Goal status: NOT MET  3.  Pt will  ambulate greater than or equal to 450 feet on with LRAD and SBA for improved cardiovascular endurance and BLE strength.  Baseline: 283 ft no AD SBA RPE 10/10 (6/6), 366 ft no AD mod I RPE 10/10 (6/30), 323 ft no AD mod I RPE 20/10 (8/6) Goal status: NOT MET  4.  Pt will demonstrate a good understanding of her diagnosis (FND/FMD) and resources available to her. Baseline: received a referral to [facility] movement disorder specialist - appt 9/24 Goal status: NOT MET   NEW SHORT TERM GOALS:   Target date: 10/13/2023   Pt will be independent with progressive HEP for improved strength, balance, transfers and gait. Baseline:  Goal status: IN PROGRESS  2.  Pt will improve gait velocity to at least 3.0 ft/sec for improved gait efficiency and performance at mod I level  Baseline: 2.7 ft/sec SBA no AD (6/6), 3.36 ft/sec mod I no AD (6/30), 2.56 ft/sec mod I no AD (8/6) Goal status: REVISED/DOWNGRADED  3.  Pt will ambulate greater than or equal to 400 feet on with LRAD and SBA for improved cardiovascular endurance and BLE strength.  Baseline: 283 ft no AD SBA RPE 10/10 (6/6), 366 ft no AD mod I RPE 10/10 (6/30), 323 ft no AD mod I RPE 20/10 (8/6) Goal status: REVISED/DOWNGRADED  4.  Pt will demonstrate good understanding of at least 3 stress relief/stress management techniques in order to better manage her anxiety and lower her blood pressure. Baseline:  Goal status: INITIAL   NEW LONG TERM GOALS:  Target date: 11/10/2023   Pt will be independent with final HEP for improved strength, balance, transfers and gait. Baseline:  Goal status: IN PROGRESS  2.  Pt will improve gait velocity to at least 3.5 ft/sec for improved gait efficiency and performance at mod I level  Baseline: 2.7 ft/sec SBA no AD (6/6), 3.36 ft/sec mod I no AD (6/30), 2.56 ft/sec mod I no AD (8/6) Goal status: IN PROGRESS  3.  Pt will ambulate greater than or equal to 450 feet on with LRAD and SBA for  improved cardiovascular endurance and BLE strength.  Baseline: 283 ft no AD SBA RPE 10/10 (6/6), 366 ft no AD mod I RPE 10/10 (6/30), 323 ft no AD mod I RPE 20/10 (8/6) Goal status: IN PROGRESS  4.  Pt will demonstrate a good understanding of her diagnosis (FND/FMD) and resources available to her. Baseline: received a referral to [facility] movement disorder specialist - appt 9/24 Goal status: IN PROGRESS     ASSESSMENT:  CLINICAL IMPRESSION: Emphasis of skilled PT session on*** continuing to monitor vitals, working on proximal strengthening in quadruped position, working on keeping eyes open during functional tasks, and working on normalizing gait pattern. Pt continues to present initially with significantly elevated BP (though improved as compared to previous session) that does improve with seated rest break in a quiet environment. She also continues to struggle to keep her eyes open while engaging in conversation or engaging in physical tasks though this is improving. She continues to benefit from skilled PT services to work towards improving her function and normalizing her movement patterns. Continue POC.   OBJECTIVE IMPAIRMENTS: Abnormal gait, cardiopulmonary status limiting activity, decreased activity tolerance, decreased  balance, decreased coordination, decreased endurance, decreased knowledge of condition, decreased knowledge of use of DME, decreased mobility, difficulty walking, impaired perceived functional ability, impaired sensation, improper body mechanics, and postural dysfunction.   ACTIVITY LIMITATIONS: carrying, lifting, bending, sitting, standing, squatting, sleeping, stairs, transfers, and bed mobility  PARTICIPATION LIMITATIONS: meal prep, cleaning, laundry, interpersonal relationship, driving, shopping, community activity, and occupation  PERSONAL FACTORS: Time since onset of injury/illness/exacerbation and 1-2 comorbidities: anxiety and focal dystonia are also  affecting patient's functional outcome.   REHAB POTENTIAL: Good  CLINICAL DECISION MAKING: Evolving/moderate complexity  EVALUATION COMPLEXITY: High  PLAN:  PT FREQUENCY: 2x/week  PT DURATION: 6 weeks  PLANNED INTERVENTIONS: 02835- PT Re-evaluation, 97750- Physical Performance Testing, 97110-Therapeutic exercises, 97530- Therapeutic activity, 97112- Neuromuscular re-education, 97535- Self Care, 02859- Manual therapy, [phone]- Gait training, [phone]- Aquatic Therapy, [phone]- Electrical stimulation (manual), [phone] (1-2 muscles), [phone] (3+ muscles)- Dry Needling, Patient/Family education, Balance training, Stair training, Taping, Joint mobilization, Spinal mobilization, DME instructions, Cryotherapy, and Moist heat  PLAN FOR NEXT SESSION: assess seated BP, quadruped and tall kneel to work on proximal stability, assess hip abd strength, work on core strengthening, functional movements, resisted gait, blaze pods, cognitive dual tasking (particularly having to speak while doing a physical task), Yoga for functional stretching- warrior pose?***   Waddell Southgate, PT Waddell Southgate, PT, DPT, CSRS   09/19/2023, 7:54 AM

## 2023-09-20 ENCOUNTER — Encounter: Admitting: Speech Pathology

## 2023-09-20 DIAGNOSIS — F411 Generalized anxiety disorder: Secondary | ICD-10-CM | POA: Diagnosis not present

## 2023-09-21 ENCOUNTER — Ambulatory Visit: Admitting: Speech Pathology

## 2023-09-21 ENCOUNTER — Encounter: Payer: Self-pay | Admitting: Speech Pathology

## 2023-09-21 DIAGNOSIS — R4789 Other speech disturbances: Secondary | ICD-10-CM

## 2023-09-21 DIAGNOSIS — R2689 Other abnormalities of gait and mobility: Secondary | ICD-10-CM | POA: Diagnosis not present

## 2023-09-21 DIAGNOSIS — R2681 Unsteadiness on feet: Secondary | ICD-10-CM | POA: Diagnosis not present

## 2023-09-21 NOTE — Therapy (Signed)
 OUTPATIENT SPEECH LANGUAGE PATHOLOGY TREATMENT NOTE   Patient Name: Lauren Levy MRN: [id number] DOB:1970-03-29, 53 y.o., female Today's Date: 09/21/2023  PCP: Lauren Velna SAUNDERS, MD REFERRING PROVIDER: Vernon Velna SAUNDERS, MD  END OF SESSION:  End of Session - 09/21/23 1118     Visit Number 4    Number of Visits 10    Date for SLP Re-Evaluation 10/26/23    SLP Start Time 1103    SLP Stop Time  1145    SLP Time Calculation (min) 42 min    Activity Tolerance Patient tolerated treatment well            Past Medical History:  Diagnosis Date   Ovarian cyst    Past Surgical History:  Procedure Laterality Date   BREAST BIOPSY Right 02/16/2021   papilloma   BREAST LUMPECTOMY Right 04/14/2021   BREAST LUMPECTOMY WITH RADIOACTIVE SEED LOCALIZATION Right 04/14/2021   Procedure: RIGHT BREAST LUMPECTOMY WITH RADIOACTIVE SEED LOCALIZATION;  Surgeon: Lauren Ned, MD;  Location: [facility];  Service: General;  Laterality: Right;   KNEE SURGERY Right    MINOR CARPAL TUNNEL Right    Patient Active Problem List   Diagnosis Date Noted   Anxiety 01/12/2022   Foot pain, right 01/12/2022   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 She reports that her R foot used to turn in and she would have to walk on her toes because her toes would curl under due to this but she had knee surgery to correct it. She reports that recently though her movements have  gotten worse, her whole body will tense up and she is unable to open her eyes and speak at the same time. She reports that speaking is very difficult, she finds herself holding her breath and feels like there is a weight sitting on her chest.   She also reports getting fatigued very easily, even just walking up the stairs at home to get to her bedroom/bathroom she will find herself out of breath. Additionally, when she wakes up first thing in the the morning she does not have any of these symptoms, they come on gradually throughout the day. Consult with movement disorder specialist was negative.   PAIN:  Are you having pain? No  FALLS: Has patient fallen in last 6 months? No, Number of falls: See PT eval  LIVING ENVIRONMENT: Lives with: lives with their family and lives with an adult companion Lives in: House/apartment  PLOF:Level of assistance: Independent with ADLs, Independent with IADLs Employment: Part-time employment  PATIENT GOALS: To speak with ease  OBJECTIVE:  Note: Objective measures were completed at Evaluation unless otherwise noted.  DIAGNOSTIC FINDINGS: 2023 MRI noirmal  COGNITION: Overall cognitive status: Within functional limits for tasks assessed  SOCIAL HISTORY: Occupation: realtor part time Water intake: optimal Caffeine/alcohol intake: minimal Daily voice use: moderate  PERCEPTUAL VOICE ASSESSMENT: Voice quality: harsh, strained, and vocal fatigue Vocal abuse: n/a Resonance: normal Respiratory function: thoracic breathing and clavicular breathing  OBJECTIVE  VOICE ASSESSMENT: Maximum phonation time for sustained ah: 5.14 Conversational loudness average: 73 dB Conversational loudness range: 72-74 dB S/z ratio: 1.09 (Suggestive of dysfunction >1.4)  PATIENT REPORTED OUTCOME MEASURES (PROM): VRQOL 32 Rated a 5 or as bad as it can be running out of air when talking, being less outgoing due to voice. A 4 , or a lot of problem speaking loudly, doing her  profession because of her voice and avoiding going out socially because of voice                                                                                                                            TREATMENT DATE:   09/21/23: Lauren Levy enters with WNL gait, speech and facial expression. When I pointed this out, dyskinesias and eye closure began. Lauren Levy states she has been having episodes of normal speech and facial posture when I don't think about it but when this is called out, her dyskinesia returns. I reinforced this improvement, reviewing strategies to allow her brain to control her body and speech. Targeted walking and talking with tossing binder clip hand to hand - she was successful for 30 -60 second increments,again reinforcing successful moments as progress. As she is having success at home speaking to family, this week she will make a phone call to a trusted person, focusing on keeping eyes open by scanning the room throughout the call - short call 3-5 minutes twice. She is to practice humming and prolonged syllables while walking to promote coordination of gait, phonation and some articulation daily at home for 3-5 minutes.   8/6/25[name] reports she has a letter allowing her to do PT with elevated blood pressure - she does some mindfulness but this is not helping with symptoms. Utilized cutting out aphasia ID cards which did suppress her dystonia and dyskinesia while she was cutting - when she started talking and stopped cutting, mild dyskinesia returned. Targeted sustaining eyes open with hum/sustained phonation for average of 10 seconds over 4 trials, progressed to sustained vowels /I/, /u/for average of 6 seconds with eyes open 3 trials for each vowel. Progressed to cv syllables ma, may, me , my sustaining both phonemes with eyes open 5/5 trials each. Combined nasal syllables to combine 3  - repeating 3 syllalbes 3x with eyes open and occasional min verbal cues. Pt then initiated trial of  taking 4 turns in conversation with eyes open for short 5-7 word utterances with occasional min A. Of note, dyskinesias reduced when focus on eyes open during non speech and speech tasks. HEP provided of tasks completed today.   08/24/23: Completed PROM - see above. Lauren Levy reports success with SOVTE. Completed parts of SOVTE with instruction to watch the clock to time 10 seconds in duration to train eyes open with phonation. Cues to complete accents, 1 per second watching the clock for eyes open. Initiated flow phonation stretch syllables watching amplitude of tissue displacement with airflow during phonation with syllables  who, shoe, sue, phrases - she required occasional cues to maintain eyes open for visual feed back of tissue. Flow phrases focusing on  gently exaggerating flow phonemes to reduce tense pressed voice with rare min A and visual cues of flow sounds highlighted. After exercises, Lauren Levy conversed with eyes open 70% of utterances discussing her family and talking while showing me texts on her phone with success. Add stretch flow phonation with tissue to HEP.   08/17/23: Evaluation completed - Initiated HEP for muscle tension dysphonia - Trained in gargle 2 minutes prior to HEP. She completed Semi-occluded Vocal Tract Exercises (SOVTE) in water with straw - 10 second hum completed 7x with occasional min verbal cues and modeling. Pitch glides and accents completed with verbal cues and modeling. Song with usual mod A - Instructed her to watch clock and time 10 seconds with hum to successfully facilitate eyes open with phonation. Education re: tension in throat results in pressed voice.     PATIENT EDUCATION: Education details: HEP, pressed v s flow voice, mindfulness Person educated: Patient Education method: Explanation, Demonstration, and Verbal cues Education comprehension: verbalized understanding, returned demonstration, verbal cues required, and needs further education  HOME EXERCISE  PROGRAM: Gargle, SOVTE in water  GOALS: Goals reviewed with patient? Yes  SHORT TERM GOALS: Target date: 09/20/13  Pt will complete HEP with rare min A Baseline: Goal status: ONGOING  2.  Pt will achieve clear phonation in structured speech exercises (resonant and flow) Baseline:  Goal status: ONGOING  3.  Pt will speak with eyes open in structured speech tasks Baseline:  Goal status: ONGOING  4.  Pt will ID 2 triggers for tension dysphonia Baseline:  Goal status: ONGOING  5.  Pt will maintain clear phonation for 3 short turns in conversation with occasional min A Baseline:  Goal status: ONGOING    LONG TERM GOALS: Target date: 10/26/23  Pt will complete HEP for speech/voice with mod I Baseline:  Goal status: ONGOING  2.  Pt will maintain clear phonation and eyes open over 5 minute simple converseation Baseline:  Goal status: ONGOING  3.  Pt will eliminate extraneous oral, neck and body movements during 5 minute conversation Baseline:  Goal status: ONGOING  4.  Pt will improve score on PROM Baseline:  Goal status: ONGOING   ASSESSMENT:  CLINICAL IMPRESSION: Patient is a 53 y.o. 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 She presents with excess tensio and extraneous movements of body while speaking. She reports she is unable to keep her eyes open when she talks. She is able to speak over the phone better because she keeps her eyes closed and doesn't feel pressure to use eye contact. 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 Dyskinesias of face and body noted throughout evaluation. She was able to keep eyes open with phonation in HEP training. Speech dysfluent apporximately 25% of utterances with aytpical stutter and short 1-2 second blocks. Voice is strained with intermittent breaks/aphonia. I recommend skilled ST to maximize voice quality, intelligibility for safety, success at work and return to [facility]. SABRA    OBJECTIVE IMPAIRMENTS: include voice disorder and speech disorder. These impairments are limiting patient from return to work and effectively communicating at home and in community. Factors affecting potential to achieve goals and functional outcome are co-morbidities.. Patient will benefit from skilled SLP services to address above impairments and improve overall function.  REHAB POTENTIAL: Good  PLAN:  SLP FREQUENCY: 1-2x/week  SLP DURATION: 10 weeks  PLANNED INTERVENTIONS: Environmental controls, Cueing hierachy, Cognitive reorganization, Internal/external aids, Functional tasks, Multimodal communication approach, SLP instruction and feedback, Compensatory strategies, Patient/family education, [phone] Treatment of speech (30 or 45 min) , and 07477- Speech Eval Sound Prod, Articulate, Phonological    Lauren Levy, Leita Caldron, CCC-SLP 09/21/2023, 12:00 PM

## 2023-09-21 NOTE — Patient Instructions (Addendum)
  Great job!! You are doing much better  Keep practicing with the hums, and other exercise  Love that you are having moments when you are speaking normally and not thinking about it - this is our goal  This week - plan a phone call with someone you know and trust   Let them know that they may hear some strain but you are OK - practice scanning the room, up, down, 4 corners, trace furniture with your eyes - short 3-5 minutes  Try this 2-3x   Also, practice walking and  (by your self) doing the hums, syllables etc - with eyes open scanning the room daily  Reach out to your PCP calling insurance and requesting transportation  SCAT

## 2023-09-23 ENCOUNTER — Ambulatory Visit: Payer: Self-pay | Admitting: Physical Therapy

## 2023-09-23 VITALS — BP 136/113 | HR 100

## 2023-09-23 DIAGNOSIS — R2681 Unsteadiness on feet: Secondary | ICD-10-CM | POA: Diagnosis not present

## 2023-09-23 DIAGNOSIS — R4789 Other speech disturbances: Secondary | ICD-10-CM | POA: Diagnosis not present

## 2023-09-23 DIAGNOSIS — R2689 Other abnormalities of gait and mobility: Secondary | ICD-10-CM

## 2023-09-23 NOTE — Therapy (Signed)
 OUTPATIENT PHYSICAL THERAPY NEURO TREATMENT   Patient Name: Lauren KINNAMON MRN: [id number] DOB:12-05-1970, 53 y.o., female Today's Date: 09/23/2023   PCP: Vernon Velna SAUNDERS, MD REFERRING PROVIDER: Hans Cinderella Duck, MD/ recert sent to PCP  END OF SESSION:  PT End of Session - 09/23/23 1317     Visit Number 14    Number of Visits 28   recert   Date for PT Re-Evaluation 11/24/23   recert   Authorization Type [employer]    Authorization Time Period VL:MN    PT Start Time 1315    PT Stop Time 1355    PT Time Calculation (min) 40 min    Equipment Utilized During Treatment Gait belt    Activity Tolerance Other (comment);Patient tolerated treatment well   HTN, improves as session progresses   Behavior During Therapy [facility] for tasks assessed/performed                  Past Medical History:  Diagnosis Date   Ovarian cyst    Past Surgical History:  Procedure Laterality Date   BREAST BIOPSY Right 02/16/2021   papilloma   BREAST LUMPECTOMY Right 04/14/2021   BREAST LUMPECTOMY WITH RADIOACTIVE SEED LOCALIZATION Right 04/14/2021   Procedure: RIGHT BREAST LUMPECTOMY WITH RADIOACTIVE SEED LOCALIZATION;  Surgeon: Vanderbilt Ned, MD;  Location: [facility];  Service: General;  Laterality: Right;   KNEE SURGERY Right    MINOR CARPAL TUNNEL Right    Patient Active Problem List   Diagnosis Date Noted   Anxiety 01/12/2022   Foot pain, right 01/12/2022   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 No complaints of pain today.  Pt was also referred to [facility] Neurology, has an appt 9/24, with a physician who specializes in movement disorders.  Pt accompanied by: self, husband Burnard  PERTINENT HISTORY: PMH: anxiety, focal dystonia  PAIN:  Are you having pain? No  PRECAUTIONS: None  PATIENT GOALS: see if we can help me talk without closing my eyes, strengthen my back so that I  can sit like normal people (slides out of chair when sitting normally), help with my walking                                                                                                                               TREATMENT  Self-Care/Home Management Vitals:   09/23/23 1321  BP: (!) 136/113  Pulse: 100     Assessed BP in LUE in sitting at rest, 2 min break in between readings: 136/113, HR 100 127/99, HR 97  Vitals determined to be within safe limits for participation in physical therapy session this date after seated rest break. Her BP does remain elevated but she has taken BP medication and anti-anxiety medication and her PCP is aware of elevated readings. This therapist comfortable with proceeding with session after 2nd reading obtained.  NMR To work on cognitive dual tasking with physical task: Static stance on airex pulling squigz while naming items color of squigz, transition to naming animals With onset of fatigue more difficulty keeping eyes open while naming things To work on normalizing gait pattern and keeping eyes open during gait: Resisted gait with purple resistance band 3 x 200 ft with focus on keeping eyes open Several standing rest breaks due to R quads tightening Seated rest break in between each round of gait   Encouraged her to sign up for [employer] membership to try some walking in water (forwards, backwards, sidestepping).    PATIENT  EDUCATION: Education details: look into [employer] for walking in water Education method: Medical illustrator Education comprehension: verbalized understanding, returned demonstration, and needs further education  HOME EXERCISE PROGRAM: -sit at dinning table for 1 meal with upright posture picturing self in mirror  Start with trying to sit upright x 15 sec without changing position, add 15 sec as this gets easier -work on sit to stands in front of full-length mirror at home, keep eyes open Add in counting up by 1's as this gets easier  -sidelying quad stretch 3 x 30 sec each B (verbally added 6/30, pt declines handout)  Access Code: [email or web address] URL: [email or web address] Date: 08/17/2023 Prepared by: Waddell Southgate  Exercises - Supine Bridge  - 1 x daily - 7 x weekly - 3 sets - 10 reps - Supine Quadricep Sets  - 1 x daily - 7 x weekly - 3 sets - 10 reps - Seated Hip Adduction Isometrics with Ball  - 1 x daily - 7 x weekly - 3 sets - 10 reps - 5 sec hold  GOALS: Goals reviewed with patient? Yes  SHORT TERM GOALS: Target date: 08/08/2023  Pt will be independent with initial HEP for improved strength, balance, transfers and gait. Baseline: Goal status: MET  2.  Pt will improve gait velocity to at least 3.0 ft/sec for improved gait efficiency and performance at SBA level  Baseline: 2.7 ft/sec SBA no AD (6/6), 3.36 ft/sec mod I no AD (6/30) Goal status: MET  3.  Pt will ambulate greater than or equal to 350 feet on with LRAD and SBA for improved cardiovascular endurance and BLE strength.  Baseline: 283 ft no AD SBA RPE 10/10 (6/6), 366 ft no AD mod I RPE 10/10 (6/30) Goal status: MET    LONG TERM GOALS: Target date: 08/29/2023   Pt will be independent with final HEP for improved strength, balance, transfers and gait. Baseline:  Goal status: IN PROGRESS  2.  Pt will improve gait velocity to at least 3.5 ft/sec for improved gait efficiency and  performance at mod I level  Baseline: 2.7 ft/sec SBA no AD (6/6), 3.36 ft/sec mod I no AD (6/30), 2.56 ft/sec mod I no AD (8/6) Goal status: NOT MET  3.  Pt will ambulate greater than or equal to 450 feet on with LRAD and SBA for improved cardiovascular endurance and BLE strength.  Baseline: 283 ft no AD SBA RPE 10/10 (6/6), 366 ft no AD mod I RPE 10/10 (6/30), 323 ft no AD mod I RPE 20/10 (8/6) Goal status: NOT MET  4.  Pt will demonstrate a good understanding of her diagnosis (FND/FMD) and resources available to her. Baseline: received a referral to [facility] Neurology movement disorder specialist - appt 9/24 Goal status: NOT MET   NEW SHORT TERM GOALS:   Target date: 10/13/2023   Pt will be independent with progressive HEP for improved strength, balance, transfers and gait. Baseline:  Goal status: IN PROGRESS  2.  Pt will improve gait velocity to at least 3.0 ft/sec for improved gait efficiency and performance at mod I level  Baseline: 2.7 ft/sec SBA no AD (6/6), 3.36 ft/sec mod I no AD (6/30), 2.56 ft/sec mod I no AD (8/6) Goal status: REVISED/DOWNGRADED  3.  Pt will ambulate greater than or equal to 400 feet on with LRAD and SBA for improved cardiovascular endurance and BLE strength.  Baseline: 283 ft no AD SBA RPE 10/10 (6/6), 366 ft no AD mod I RPE 10/10 (6/30), 323 ft no AD mod I RPE 20/10 (8/6) Goal status: REVISED/DOWNGRADED  4.  Pt will demonstrate good understanding of at least 3 stress relief/stress management techniques in order to better manage her anxiety and lower her blood pressure. Baseline:  Goal status: INITIAL   NEW LONG TERM GOALS:  Target date: 11/10/2023   Pt will be independent with final HEP for improved strength, balance, transfers and gait. Baseline:  Goal status: IN PROGRESS  2.  Pt will improve gait velocity to at least 3.5 ft/sec for improved gait efficiency and performance at mod I level  Baseline: 2.7 ft/sec SBA no AD (6/6), 3.36 ft/sec  mod I no AD (6/30), 2.56 ft/sec mod I no AD (8/6) Goal status: IN PROGRESS  3.  Pt will ambulate greater than or equal to 450 feet on with LRAD and SBA for improved cardiovascular endurance and BLE strength.  Baseline: 283 ft no AD SBA RPE 10/10 (6/6), 366 ft no AD mod I RPE 10/10 (6/30), 323 ft no AD mod I RPE 20/10 (8/6) Goal status: IN PROGRESS  4.  Pt will demonstrate a good understanding of her diagnosis (FND/FMD) and resources available to her. Baseline: received a referral to [facility] Neurology movement disorder specialist - appt 9/24 Goal status: IN PROGRESS     ASSESSMENT:  CLINICAL IMPRESSION: Emphasis of skilled PT session on continuing to monitor vitals, working on keeping eyes open during functional tasks, and working on normalizing gait pattern. Pt  continues to present initially with elevated BP (though improved as compared to previous session) that does improve with seated rest break in a quiet environment. She also continues to struggle to keep her eyes open while engaging in conversation or engaging in physical tasks though this is improving. She also continues to exhibit increased pelvic and trunk rotation during gait and onset of R quad cramping with forwards gait. She continues to benefit from skilled PT services to work towards improving her function and normalizing her movement patterns. Continue POC.   OBJECTIVE IMPAIRMENTS: Abnormal gait, cardiopulmonary status limiting activity, decreased activity tolerance, decreased balance, decreased coordination, decreased endurance, decreased knowledge of condition, decreased knowledge of use of DME, decreased mobility, difficulty walking, impaired perceived functional ability, impaired sensation, improper body mechanics, and postural dysfunction.   ACTIVITY LIMITATIONS: carrying, lifting, bending, sitting, standing, squatting, sleeping, stairs, transfers, and bed mobility  PARTICIPATION LIMITATIONS: meal prep, cleaning,  laundry, interpersonal relationship, driving, shopping, community activity, and occupation  PERSONAL FACTORS: Time since onset of injury/illness/exacerbation and 1-2 comorbidities: anxiety and focal dystonia are also affecting patient's functional outcome.   REHAB POTENTIAL: Good  CLINICAL DECISION MAKING: Evolving/moderate complexity  EVALUATION COMPLEXITY: High  PLAN:  PT FREQUENCY: 2x/week  PT DURATION: 6 weeks  PLANNED INTERVENTIONS: 02835- PT Re-evaluation, 97750- Physical Performance Testing, 97110-Therapeutic exercises, 97530- Therapeutic activity, 97112- Neuromuscular re-education, 97535- Self Care, 02859- Manual therapy, [phone]- Gait training, [phone]- Aquatic Therapy, [phone]- Electrical stimulation (manual), [phone] (1-2 muscles), [phone] (3+ muscles)- Dry Needling, Patient/Family education, Balance training, Stair training, Taping, Joint mobilization, Spinal mobilization, DME instructions, Cryotherapy, and Moist heat  PLAN FOR NEXT SESSION: assess seated BP, quadruped and tall kneel to work on proximal stability, assess hip abd strength, work on core strengthening, functional movements, resisted gait, blaze pods, cognitive dual tasking (particularly having to speak while doing a physical task), Yoga for functional stretching- warrior pose?, did she look into [employer]?   Bowman Higbie, PT Waddell Southgate, PT, DPT, CSRS   09/23/2023, 2:00 PM

## 2023-09-27 ENCOUNTER — Ambulatory Visit: Payer: Self-pay | Admitting: Physical Therapy

## 2023-09-27 ENCOUNTER — Ambulatory Visit: Admitting: Speech Pathology

## 2023-09-27 ENCOUNTER — Encounter: Payer: Self-pay | Admitting: Speech Pathology

## 2023-09-27 VITALS — BP 130/101 | HR 87

## 2023-09-27 DIAGNOSIS — R2689 Other abnormalities of gait and mobility: Secondary | ICD-10-CM | POA: Diagnosis not present

## 2023-09-27 DIAGNOSIS — R4789 Other speech disturbances: Secondary | ICD-10-CM

## 2023-09-27 DIAGNOSIS — R2681 Unsteadiness on feet: Secondary | ICD-10-CM

## 2023-09-27 NOTE — Therapy (Signed)
 OUTPATIENT PHYSICAL THERAPY NEURO TREATMENT   Patient Name: Lauren Levy MRN: [id number] DOB:03/09/1970, 53 y.o., female Today's Date: 09/27/2023   PCP: Vernon Velna SAUNDERS, MD REFERRING PROVIDER: Hans Cinderella Duck, MD/ recert sent to PCP  END OF SESSION:  PT End of Session - 09/27/23 1153     Visit Number 15    Number of Visits 28   recert   Date for PT Re-Evaluation 11/24/23   recert   Authorization Type [employer]    Authorization Time Period VL:MN    PT Start Time 1150    PT Stop Time 1230    PT Time Calculation (min) 40 min    Equipment Utilized During Treatment Gait belt    Activity Tolerance Other (comment);Patient tolerated treatment well   HTN, improves as session progresses   Behavior During Therapy [facility] for tasks assessed/performed                   Past Medical History:  Diagnosis Date   Ovarian cyst    Past Surgical History:  Procedure Laterality Date   BREAST BIOPSY Right 02/16/2021   papilloma   BREAST LUMPECTOMY Right 04/14/2021   BREAST LUMPECTOMY WITH RADIOACTIVE SEED LOCALIZATION Right 04/14/2021   Procedure: RIGHT BREAST LUMPECTOMY WITH RADIOACTIVE SEED LOCALIZATION;  Surgeon: Vanderbilt Ned, MD;  Location: [facility];  Service: General;  Laterality: Right;   KNEE SURGERY Right    MINOR CARPAL TUNNEL Right    Patient Active Problem List   Diagnosis Date Noted   Anxiety 01/12/2022   Foot pain, right 01/12/2022   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   Dystonia 06/03/2021   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 She has not been able to look into [facility] membership yet due to all this going on and moving her daughter back into school.  In terms of moving and walking it has its up and downs, got up to use the bathroom overnight and walked normally but walking back to bed she started twisting her hips/pelvis again.  Pt was tired after last visit, her legs were tight.  Pt was also referred to [facility] Neurology, has an appt 9/24, with a physician who specializes in movement disorders.  Pt accompanied by: self  PERTINENT HISTORY: PMH: anxiety, focal dystonia  PAIN:  Are you having pain? No  PRECAUTIONS: None  PATIENT GOALS: see if we can help me talk without closing my eyes, strengthen my back so that I can sit like normal people (slides out of chair when sitting normally), help with my walking                                                                                                                               TREATMENT  Self-Care/Home Management Vitals:   09/27/23 1244 09/27/23 1245  BP: (!) 136/106 (!) 130/101  Pulse: 91 87   Assessed BP in LUE in sitting at rest, 2 min break in between readings: 136/106, HR 91 148/109, HR 93 130/101, HR 87  Vitals determined to be within safe limits for participation in physical therapy session this date after seated rest break. Her BP does remain elevated but she has taken BP medication and anti-anxiety medication and her PCP is aware of elevated readings. This therapist comfortable with proceeding with session after 3rd reading obtained.  Discussed mindfulness and meditation, and performing progressive muscle relaxation as patient has worked on this with her therapist. Encouraged her to work on this on her own as well several times a week if not daily.  Encouraged her to sign up  for [facility] membership to try some walking in water (forwards, backwards, sidestepping).  NMR To work on cognitive dual tasking with physical task: Forwards lunges 2 x 25 ft Added in unweighted ball twist 2 x 25 ft Decreased balance with added in ball twist Added in naming foods, naming animals with ball twist 2 x 25 ft Significant difficulty with this, several standing rest breaks and decrease in balance   To work on core strengthening while keeping eyes open: Standard planks x 5 reps with 10-15 sec hold Side planks x 4 reps B with 10-15 sec hold     PATIENT EDUCATION: Education details: look into [employer] for walking in water, work on meditation and progressive muscle relaxation at home Education method: Medical illustrator Education comprehension: verbalized understanding, returned demonstration, and needs further education  HOME EXERCISE PROGRAM: -sit at dinning table for 1 meal with upright posture picturing self in mirror  Start with trying to sit upright x 15 sec without changing position, add 15 sec as this gets easier -work on sit to stands in front of full-length mirror at home, keep eyes open Add in counting up by 1's as this gets easier  -sidelying quad stretch 3 x 30 sec each B (verbally added 6/30, pt declines handout)  Access Code: [email or web address] URL: [email or web address] Date: 08/17/2023 Prepared by: Waddell Southgate  Exercises - Supine Bridge  - 1 x daily -  7 x weekly - 3 sets - 10 reps - Supine Quadricep Sets  - 1 x daily - 7 x weekly - 3 sets - 10 reps - Seated Hip Adduction Isometrics with Ball  - 1 x daily - 7 x weekly - 3 sets - 10 reps - 5 sec hold  GOALS: Goals reviewed with patient? Yes  SHORT TERM GOALS: Target date: 08/08/2023  Pt will be independent with initial HEP for improved strength, balance, transfers and gait. Baseline: Goal status: MET  2.  Pt will improve gait velocity to at least 3.0 ft/sec for improved gait  efficiency and performance at SBA level  Baseline: 2.7 ft/sec SBA no AD (6/6), 3.36 ft/sec mod I no AD (6/30) Goal status: MET  3.  Pt will ambulate greater than or equal to 350 feet on with LRAD and SBA for improved cardiovascular endurance and BLE strength.  Baseline: 283 ft no AD SBA RPE 10/10 (6/6), 366 ft no AD mod I RPE 10/10 (6/30) Goal status: MET    LONG TERM GOALS: Target date: 08/29/2023   Pt will be independent with final HEP for improved strength, balance, transfers and gait. Baseline:  Goal status: IN PROGRESS  2.  Pt will improve gait velocity to at least 3.5 ft/sec for improved gait efficiency and performance at mod I level  Baseline: 2.7 ft/sec SBA no AD (6/6), 3.36 ft/sec mod I no AD (6/30), 2.56 ft/sec mod I no AD (8/6) Goal status: NOT MET  3.  Pt will ambulate greater than or equal to 450 feet on with LRAD and SBA for improved cardiovascular endurance and BLE strength.  Baseline: 283 ft no AD SBA RPE 10/10 (6/6), 366 ft no AD mod I RPE 10/10 (6/30), 323 ft no AD mod I RPE 20/10 (8/6) Goal status: NOT MET  4.  Pt will demonstrate a good understanding of her diagnosis (FND/FMD) and resources available to her. Baseline: received a referral to [facility] Neurology movement disorder specialist - appt 9/24 Goal status: NOT MET   NEW SHORT TERM GOALS:   Target date: 10/13/2023   Pt will be independent with progressive HEP for improved strength, balance, transfers and gait. Baseline:  Goal status: IN PROGRESS  2.  Pt will improve gait velocity to at least 3.0 ft/sec for improved gait efficiency and performance at mod I level  Baseline: 2.7 ft/sec SBA no AD (6/6), 3.36 ft/sec mod I no AD (6/30), 2.56 ft/sec mod I no AD (8/6) Goal status: REVISED/DOWNGRADED  3.  Pt will ambulate greater than or equal to 400 feet on with LRAD and SBA for improved cardiovascular endurance and BLE strength.  Baseline: 283 ft no AD SBA RPE 10/10 (6/6), 366 ft no AD mod I RPE  10/10 (6/30), 323 ft no AD mod I RPE 20/10 (8/6) Goal status: REVISED/DOWNGRADED  4.  Pt will demonstrate good understanding of at least 3 stress relief/stress management techniques in order to better manage her anxiety and lower her blood pressure. Baseline:  Goal status: INITIAL   NEW LONG TERM GOALS:  Target date: 11/10/2023   Pt will be independent with final HEP for improved strength, balance, transfers and gait. Baseline:  Goal status: IN PROGRESS  2.  Pt will improve gait velocity to at least 3.5 ft/sec for improved gait efficiency and performance at mod I level  Baseline: 2.7 ft/sec SBA no AD (6/6), 3.36 ft/sec mod I no AD (6/30), 2.56 ft/sec mod I no AD (8/6) Goal status:  IN PROGRESS  3.  Pt will ambulate greater than or equal to 450 feet on with LRAD and SBA for improved cardiovascular endurance and BLE strength.  Baseline: 283 ft no AD SBA RPE 10/10 (6/6), 366 ft no AD mod I RPE 10/10 (6/30), 323 ft no AD mod I RPE 20/10 (8/6) Goal status: IN PROGRESS  4.  Pt will demonstrate a good understanding of her diagnosis (FND/FMD) and resources available to her. Baseline: received a referral to [facility] Neurology movement disorder specialist - appt 9/24 Goal status: IN PROGRESS     ASSESSMENT:  CLINICAL IMPRESSION: Emphasis of skilled PT session on continuing to monitor vitals and working on keeping eyes open during cognitive dual tasks and core strengthening. Pt continues to present initially with elevated BP (though improved as compared to previous session) that does improve with seated rest break in a quiet environment. She also continues to struggle to keep her eyes open while engaging in conversation or engaging in physical tasks though this is improving. She also continues to exhibit increased pelvic and trunk rotation during gait and onset of R quad cramping with forwards gait. She continues to benefit from skilled PT services to work towards improving her function and  normalizing her movement patterns. Continue POC.   OBJECTIVE IMPAIRMENTS: Abnormal gait, cardiopulmonary status limiting activity, decreased activity tolerance, decreased balance, decreased coordination, decreased endurance, decreased knowledge of condition, decreased knowledge of use of DME, decreased mobility, difficulty walking, impaired perceived functional ability, impaired sensation, improper body mechanics, and postural dysfunction.   ACTIVITY LIMITATIONS: carrying, lifting, bending, sitting, standing, squatting, sleeping, stairs, transfers, and bed mobility  PARTICIPATION LIMITATIONS: meal prep, cleaning, laundry, interpersonal relationship, driving, shopping, community activity, and occupation  PERSONAL FACTORS: Time since onset of injury/illness/exacerbation and 1-2 comorbidities: anxiety and focal dystonia are also affecting patient's functional outcome.   REHAB POTENTIAL: Good  CLINICAL DECISION MAKING: Evolving/moderate complexity  EVALUATION COMPLEXITY: High  PLAN:  PT FREQUENCY: 2x/week  PT DURATION: 6 weeks  PLANNED INTERVENTIONS: 02835- PT Re-evaluation, 97750- Physical Performance Testing, 97110-Therapeutic exercises, 97530- Therapeutic activity, 97112- Neuromuscular re-education, 97535- Self Care, 02859- Manual therapy, [phone]- Gait training, [phone]- Aquatic Therapy, [phone]- Electrical stimulation (manual), [phone] (1-2 muscles), [phone] (3+ muscles)- Dry Needling, Patient/Family education, Balance training, Stair training, Taping, Joint mobilization, Spinal mobilization, DME instructions, Cryotherapy, and Moist heat  PLAN FOR NEXT SESSION: assess seated BP, quadruped and tall kneel to work on proximal stability, assess hip abd strength, work on core strengthening, functional movements, resisted gait, blaze pods, cognitive dual tasking (particularly having to speak while doing a physical task), Yoga for functional stretching- warrior pose?, did she look into [employer]?,  elliptical?   Waddell Southgate, PT Waddell Southgate, PT, DPT, CSRS   09/27/2023, 12:45 PM

## 2023-09-27 NOTE — Therapy (Signed)
 OUTPATIENT SPEECH LANGUAGE PATHOLOGY TREATMENT NOTE   Patient Name: Lauren Levy MRN: [id number] DOB:Jun 26, 1970, 53 y.o., female Today's Date: 09/27/2023  PCP: Vernon Velna SAUNDERS, MD REFERRING PROVIDER: Vernon Velna SAUNDERS, MD  END OF SESSION:  End of Session - 09/27/23 1102     Visit Number 5    Number of Visits 10    Date for SLP Re-Evaluation 10/26/23    SLP Start Time 1104    SLP Stop Time  1145    SLP Time Calculation (min) 41 min    Activity Tolerance Patient tolerated treatment well            Past Medical History:  Diagnosis Date   Ovarian cyst    Past Surgical History:  Procedure Laterality Date   BREAST BIOPSY Right 02/16/2021   papilloma   BREAST LUMPECTOMY Right 04/14/2021   BREAST LUMPECTOMY WITH RADIOACTIVE SEED LOCALIZATION Right 04/14/2021   Procedure: RIGHT BREAST LUMPECTOMY WITH RADIOACTIVE SEED LOCALIZATION;  Surgeon: Vanderbilt Ned, MD;  Location: [facility];  Service: General;  Laterality: Right;   KNEE SURGERY Right    MINOR CARPAL TUNNEL Right    Patient Active Problem List   Diagnosis Date Noted   Anxiety 01/12/2022   Foot pain, right 01/12/2022   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 She reports that her R foot used to turn in and she would have to walk on her toes because her toes would curl under due to this but she had knee surgery to correct it. She reports that recently though her movements have  gotten worse, her whole body will tense up and she is unable to open her eyes and speak at the same time. She reports that speaking is very difficult, she finds herself holding her breath and feels like there is a weight sitting on her chest.   She also reports getting fatigued very easily, even just walking up the stairs at home to get to her bedroom/bathroom she will find herself out of breath. Additionally, when she wakes up first thing in the the morning she does not have any of these symptoms, they come on gradually throughout the day. Consult with movement disorder specialist was negative.   PAIN:  Are you having pain? No  FALLS: Has patient fallen in last 6 months? No, Number of falls: See PT eval  LIVING ENVIRONMENT: Lives with: lives with their family and lives with an adult companion Lives in: House/apartment  PLOF:Level of assistance: Independent with ADLs, Independent with IADLs Employment: Part-time employment  PATIENT GOALS: To speak with ease  OBJECTIVE:  Note: Objective measures were completed at Evaluation unless otherwise noted.  DIAGNOSTIC FINDINGS: 2023 MRI noirmal  COGNITION: Overall cognitive status: Within functional limits for tasks assessed  SOCIAL HISTORY: Occupation: realtor part time Water intake: optimal Caffeine/alcohol intake: minimal Daily voice use: moderate  PERCEPTUAL VOICE ASSESSMENT: Voice quality: harsh, strained, and vocal fatigue Vocal abuse: n/a Resonance: normal Respiratory function: thoracic breathing and clavicular breathing  OBJECTIVE VOICE  ASSESSMENT: Maximum phonation time for sustained ah: 5.14 Conversational loudness average: 73 dB Conversational loudness range: 72-74 dB S/z ratio: 1.09 (Suggestive of dysfunction >1.4)  PATIENT REPORTED OUTCOME MEASURES (PROM): VRQOL 32 Rated a 5 or as bad as it can be running out of air when talking, being less outgoing due to voice. A 4 , or a lot of problem speaking loudly, doing her  profession because of her voice and avoiding going out socially because of voice                                                                                                                            TREATMENT DATE:   09/27/23: She was unable to practice her HEP due to spouse breaking his ankle and moving her daughter into her college dorm. In initial conversation, with verbal cues 3x over 5 minutes, she was able to correct closed eyes and continue conversation with eyes open. Structured speech task counting by 5's to 100 forward and backward, saying months backward, she kept eyes open 85% of words with min verbal cues. Progressed to standing, coordinating eyes open, relaxed body humming, syllables, counting and song in nasal cv syllables (ma) - with occasional verbal cues, Keyonta completed phonation and speech tasks with eyes open in standing position. HEP see Patient Instructions  09/21/23: Tonjua enters with WNL gait, speech and facial expression. When I pointed this out, dyskinesias and eye closure began. Yaeko states she has been having episodes of normal speech and facial posture when I don't think about it but when this is called out, her dyskinesia returns. I reinforced this improvement, reviewing strategies to allow her brain to control her body and speech. Targeted walking and talking with tossing binder clip hand to hand - she was successful for 30 -60 second increments,again reinforcing successful moments as progress. As she is having success at home speaking to family, this week she will make a phone call to a trusted person, focusing on keeping eyes open by scanning the room throughout the call - short call 3-5 minutes twice. She is to practice humming and prolonged syllables while walking to promote coordination of gait, phonation and some articulation daily at home for 3-5 minutes.   8/6/25BETHA [name] reports she has a letter allowing her to do PT with elevated blood pressure - she does  some mindfulness but this is not helping with symptoms. Utilized cutting out aphasia ID cards which did suppress her dystonia and dyskinesia while she was cutting - when she started talking and stopped cutting, mild dyskinesia returned. Targeted sustaining eyes open with hum/sustained phonation for average of 10 seconds over 4 trials, progressed to sustained vowels /I/, /u/for average of 6 seconds with eyes open 3 trials for each vowel. Progressed to cv syllables ma, may, me , my sustaining both phonemes with eyes open 5/5 trials each. Combined nasal syllables to combine 3  - repeating 3 syllalbes 3x with eyes open and occasional min  verbal cues. Pt then initiated trial of taking 4 turns in conversation with eyes open for short 5-7 word utterances with occasional min A. Of note, dyskinesias reduced when focus on eyes open during non speech and speech tasks. HEP provided of tasks completed today.   08/24/23: Completed PROM - see above. Charon reports success with SOVTE. Completed parts of SOVTE with instruction to watch the clock to time 10 seconds in duration to train eyes open with phonation. Cues to complete accents, 1 per second watching the clock for eyes open. Initiated flow phonation stretch syllables watching amplitude of tissue displacement with airflow during phonation with syllables who, shoe, sue, phrases - she required occasional cues to maintain eyes open for visual feed back of tissue. Flow phrases focusing on  gently exaggerating flow phonemes to reduce tense pressed voice with rare min A and visual cues of flow sounds highlighted. After exercises, Beda conversed with eyes open 70% of utterances discussing her family and talking while showing me texts on her phone with success. Add stretch flow phonation with tissue to HEP.   08/17/23: Evaluation completed - Initiated HEP for muscle tension dysphonia - Trained in gargle 2 minutes prior to HEP. She completed Semi-occluded Vocal Tract Exercises  (SOVTE) in water with straw - 10 second hum completed 7x with occasional min verbal cues and modeling. Pitch glides and accents completed with verbal cues and modeling. Song with usual mod A - Instructed her to watch clock and time 10 seconds with hum to successfully facilitate eyes open with phonation. Education re: tension in throat results in pressed voice.     PATIENT EDUCATION: Education details: HEP, pressed v s flow voice, mindfulness Person educated: Patient Education method: Explanation, Demonstration, and Verbal cues Education comprehension: verbalized understanding, returned demonstration, verbal cues required, and needs further education  HOME EXERCISE PROGRAM: Gargle, SOVTE in water  GOALS: Goals reviewed with patient? Yes  SHORT TERM GOALS: Target date: 09/20/13  Pt will complete HEP with rare min A Baseline: Goal status: ONGOING  2.  Pt will achieve clear phonation in structured speech exercises (resonant and flow) Baseline:  Goal status: ONGOING  3.  Pt will speak with eyes open in structured speech tasks Baseline:  Goal status: ONGOING  4.  Pt will ID 2 triggers for tension dysphonia Baseline:  Goal status: ONGOING  5.  Pt will maintain clear phonation for 3 short turns in conversation with occasional min A Baseline:  Goal status: ONGOING    LONG TERM GOALS: Target date: 10/26/23  Pt will complete HEP for speech/voice with mod I Baseline:  Goal status: ONGOING  2.  Pt will maintain clear phonation and eyes open over 5 minute simple converseation Baseline:  Goal status: ONGOING  3.  Pt will eliminate extraneous oral, neck and body movements during 5 minute conversation Baseline:  Goal status: ONGOING  4.  Pt will improve score on PROM Baseline:  Goal status: ONGOING   ASSESSMENT:  CLINICAL IMPRESSION: Patient is a 53 y.o. 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 She presents with excess tensio and  extraneous movements of body while speaking. She reports she is unable to keep her eyes open when she talks. She is able to speak over the phone better because she keeps her eyes closed and doesn't feel pressure to use eye contact. 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 Dyskinesias of face and body noted throughout evaluation. She was able to keep  eyes open with phonation in HEP training. Speech dysfluent apporximately 25% of utterances with aytpical stutter and short 1-2 second blocks. Voice is strained with intermittent breaks/aphonia. I recommend skilled ST to maximize voice quality, intelligibility for safety, success at work and return to [facility] [employer] [facility]. SABRA   OBJECTIVE IMPAIRMENTS: include voice disorder and speech disorder. These impairments are limiting patient from return to work and effectively communicating at home and in community. Factors affecting potential to achieve goals and functional outcome are co-morbidities.. Patient will benefit from skilled SLP services to address above impairments and improve overall function.  REHAB POTENTIAL: Good  PLAN:  SLP FREQUENCY: 1-2x/week  SLP DURATION: 10 weeks  PLANNED INTERVENTIONS: Environmental controls, Cueing hierachy, Cognitive reorganization, Internal/external aids, Functional tasks, Multimodal communication approach, SLP instruction and feedback, Compensatory strategies, Patient/family education, [phone] Treatment of speech (30 or 45 min) , and [id number]- Speech Eval Sound Prod, Articulate, Phonological    Durand Wittmeyer, Leita Caldron, CCC-SLP 09/27/2023, 12:04 PM

## 2023-09-27 NOTE — Patient Instructions (Signed)
  3x a day to establish the motor pattern  Stand and relax face and eyes - scanning the room corner to corner hum song, then ma song, then me song   Turn to next wall, count by 5's and 2's with relaxed body, scanning with your eyes  Then do walking and humming (around the room) short - then increase the time - so maybe walk 3x around the room  If you have success looking past or out the window talking with relaxed eyes open, practice it  The more you can successfully practice the motor patterns of eyes open, relaxed speaking, humming, counting etc, the better that motor pattern will be established and will be easier to transfer to more challenging speaking situations.

## 2023-09-29 DIAGNOSIS — F411 Generalized anxiety disorder: Secondary | ICD-10-CM | POA: Diagnosis not present

## 2023-09-30 ENCOUNTER — Ambulatory Visit: Payer: Self-pay | Admitting: Physical Therapy

## 2023-09-30 VITALS — BP 141/105 | HR 94

## 2023-09-30 DIAGNOSIS — R2681 Unsteadiness on feet: Secondary | ICD-10-CM

## 2023-09-30 DIAGNOSIS — R2689 Other abnormalities of gait and mobility: Secondary | ICD-10-CM

## 2023-09-30 DIAGNOSIS — R4789 Other speech disturbances: Secondary | ICD-10-CM | POA: Diagnosis not present

## 2023-09-30 NOTE — Therapy (Signed)
 OUTPATIENT PHYSICAL THERAPY NEURO TREATMENT   Patient Name: Lauren Levy MRN: [id number] DOB:[birth year], 53 y.o., female Today's Date: 09/30/2023   PCP: Vernon Velna SAUNDERS, MD REFERRING PROVIDER: Hans Cinderella Duck, MD/ recert sent to PCP  END OF SESSION:  PT End of Session - 09/30/23 1321     Visit Number 16    Number of Visits 28   recert   Date for PT Re-Evaluation 11/24/23   recert   Authorization Type [employer]    Authorization Time Period [id number]    PT Start Time 1320   pt arrived late   PT Stop Time 1400    PT Time Calculation (min) 40 min    Equipment Utilized During Treatment Gait belt    Activity Tolerance Other (comment);Patient tolerated treatment well   HTN, improves as session progresses   Behavior During Therapy [facility] for tasks assessed/performed                    Past Medical History:  Diagnosis Date   Ovarian cyst    Past Surgical History:  Procedure Laterality Date   BREAST BIOPSY Right 02/16/2021   papilloma   BREAST LUMPECTOMY Right 04/14/2021   BREAST LUMPECTOMY WITH RADIOACTIVE SEED LOCALIZATION Right 04/14/2021   Procedure: RIGHT BREAST LUMPECTOMY WITH RADIOACTIVE SEED LOCALIZATION;  Surgeon: Vanderbilt Ned, MD;  Location: [facility];  Service: General;  Laterality: Right;   KNEE SURGERY Right    MINOR CARPAL TUNNEL Right    Patient Active Problem List   Diagnosis Date Noted   Anxiety 01/12/2022   Foot pain, right 01/12/2022   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   Dystonia 06/03/2021   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   Gait disturbance 10/09/2020    ONSET DATE: 06/29/2023  REFERRING DIAG: R26.9 (ICD-10-CM) - Unspecified abnormalities of gait and mobility  THERAPY DIAG:  Unsteadiness on feet  Other abnormalities of gait and mobility  Rationale for Evaluation and Treatment: Rehabilitation  SUBJECTIVE:                                                                                                                                                                                              SUBJECTIVE STATEMENT:  Pt reports that she is really struggling today with keeping her eyes open while walking, speaking, etc. She was doing better overall but is having a rough day today. Her gait is more impaired/abnormal today as well walking in to her appointment. Pt had pain in her R knee after lunges last session.  Pt was also referred to [facility], has an appt 9/24, with a physician who specializes in movement  disorders.  Pt accompanied by: self  PERTINENT HISTORY: PMH: anxiety, focal dystonia  PAIN:  Are you having pain? No  PRECAUTIONS: None  PATIENT GOALS: see if we can help me talk without closing my eyes, strengthen my back so that I can sit like normal people (slides out of chair when sitting normally), help with my walking                                                                                                                               TREATMENT  Self-Care/Home Management Vitals:   09/30/23 1327  BP: (!) 141/105  Pulse: 94    Assessed BP in LUE in sitting at rest: 141/105, HR 94  Vitals determined to be within safe limits for participation in physical therapy session this date . Her BP does remain elevated but she has taken BP medication and anti-anxiety medication and her PCP is aware of elevated readings. This therapist comfortable with proceeding with session after reading obtained.   NMR To work on reciprocal movement patterns: Elliptical level 2 x 2.5 min; level 1 x 2.5 min with 3 standing rest breaks due to fatigue  To address ongoing muscle spasms in R quad with gait NMES level 7.5 x 10 min to R quads Focus on contracting muscle when muscle being stimulated during ON cycle and relaxing muscle during OFF cycle Followed by gait x 115 ft with decreased R quad cramping/spasms however pt does have increased cramping in her B glutess Seated B glute squeezes x 10  reps Followed by gait x 115 ft, improved gait mechanics initially but by the end increased tightness in glutes and quads    PATIENT EDUCATION: Education details: look into [employer] for walking in water, work on meditation and progressive muscle relaxation at home, continue to work on muscle contraction and relaxation prior to walking Education method: Medical illustrator Education comprehension: verbalized understanding, returned demonstration, and needs further education  HOME EXERCISE PROGRAM: -sit at dinning table for 1 meal with upright posture picturing self in mirror  Start with trying to sit upright x 15 sec without changing position, add 15 sec as this gets easier -work on sit to stands in front of full-length mirror at home, keep eyes open Add in counting up by 1's as this gets easier  -sidelying quad stretch 3 x 30 sec each B (verbally added 6/30, pt declines handout)  Access Code: [email or web address] URL: [email or web address] Date: 08/17/2023 Prepared by: Waddell Southgate  Exercises - Supine Bridge  - 1 x daily - 7 x weekly - 3 sets - 10 reps - Supine Quadricep Sets  - 1 x daily - 7 x weekly - 3 sets - 10 reps - Seated Hip Adduction Isometrics with Ball  - 1 x daily - 7 x weekly - 3 sets - 10 reps - 5 sec hold  GOALS: Goals reviewed with patient? Yes  SHORT TERM GOALS: Target date: 08/08/2023  Pt will be independent with initial HEP for improved strength, balance, transfers and gait. Baseline: Goal status: MET  2.  Pt will improve gait velocity to at least 3.0 ft/sec for improved gait efficiency and performance at SBA level  Baseline: 2.7 ft/sec SBA no AD (6/6), 3.36 ft/sec mod I no AD (6/30) Goal status: MET  3.  Pt will ambulate greater than or equal to 350 feet on with LRAD and SBA for improved cardiovascular endurance and BLE strength.  Baseline: 283 ft no AD SBA RPE 10/10 (6/6), 366 ft no AD mod I RPE 10/10 (6/30) Goal status:  MET    LONG TERM GOALS: Target date: 08/29/2023   Pt will be independent with final HEP for improved strength, balance, transfers and gait. Baseline:  Goal status: IN PROGRESS  2.  Pt will improve gait velocity to at least 3.5 ft/sec for improved gait efficiency and performance at mod I level  Baseline: 2.7 ft/sec SBA no AD (6/6), 3.36 ft/sec mod I no AD (6/30), 2.56 ft/sec mod I no AD (8/6) Goal status: NOT MET  3.  Pt will ambulate greater than or equal to 450 feet on with LRAD and SBA for improved cardiovascular endurance and BLE strength.  Baseline: 283 ft no AD SBA RPE 10/10 (6/6), 366 ft no AD mod I RPE 10/10 (6/30), 323 ft no AD mod I RPE 20/10 (8/6) Goal status: NOT MET  4.  Pt will demonstrate a good understanding of her diagnosis (FND/FMD) and resources available to her. Baseline: received a referral to [facility] movement disorder specialist - appt 9/24 Goal status: NOT MET   NEW SHORT TERM GOALS:   Target date: 10/13/2023   Pt will be independent with progressive HEP for improved strength, balance, transfers and gait. Baseline:  Goal status: IN PROGRESS  2.  Pt will improve gait velocity to at least 3.0 ft/sec for improved gait efficiency and performance at mod I level  Baseline: 2.7 ft/sec SBA no AD (6/6), 3.36 ft/sec mod I no AD (6/30), 2.56 ft/sec mod I no AD (8/6) Goal status: REVISED/DOWNGRADED  3.  Pt will ambulate greater than or equal to 400 feet on with LRAD and SBA for improved cardiovascular endurance and BLE strength.  Baseline: 283 ft no AD SBA RPE 10/10 (6/6), 366 ft no AD mod I RPE 10/10 (6/30), 323 ft no AD mod I RPE 20/10 (8/6) Goal status: REVISED/DOWNGRADED  4.  Pt will demonstrate good understanding of at least 3 stress relief/stress management techniques in order to better manage her anxiety and lower her blood pressure. Baseline:  Goal status: INITIAL   NEW LONG TERM GOALS:  Target date: 11/10/2023   Pt will be independent  with final HEP for improved strength, balance, transfers and gait. Baseline:  Goal status: IN PROGRESS  2.  Pt will improve gait velocity to at least 3.5 ft/sec for improved gait efficiency and performance at mod I level  Baseline: 2.7 ft/sec SBA no AD (6/6), 3.36 ft/sec mod I no AD (6/30), 2.56 ft/sec mod I no AD (8/6) Goal status: IN PROGRESS  3.  Pt will ambulate greater than or equal to 450 feet on with LRAD and SBA for improved cardiovascular endurance and BLE strength.  Baseline: 283 ft no AD SBA RPE 10/10 (6/6), 366 ft no AD mod I RPE 10/10 (6/30), 323 ft no AD mod I RPE 20/10 (8/6) Goal status: IN PROGRESS  4.  Pt will demonstrate a good understanding  of her diagnosis (FND/FMD) and resources available to her. Baseline: received a referral to [facility] movement disorder specialist - appt 9/24 Goal status: IN PROGRESS     ASSESSMENT:  CLINICAL IMPRESSION: Emphasis of skilled PT session on continuing to monitor vitals and working on normalizing movement patterns, reciprocal movement patterns, and trial of estim for quad contraction to see if it affects ongoing muscle tightness/spasms that patient gets in muscles during ambulation. Pt continues to present with elevated BP but she is on medication for anxiety and to address her elevated BP. She also continues to struggle to keep her eyes open while engaging in conversation or engaging in physical tasks though this is improving. She also continues to exhibit increased pelvic and trunk rotation during gait and onset of R quad cramping with forwards gait, does slightly decrease following use of estim. She could benefit from further trials of estim in future sessions. She continues to benefit from skilled PT services to work towards improving her function and normalizing her movement patterns. Continue POC.   OBJECTIVE IMPAIRMENTS: Abnormal gait, cardiopulmonary status limiting activity, decreased activity tolerance, decreased  balance, decreased coordination, decreased endurance, decreased knowledge of condition, decreased knowledge of use of DME, decreased mobility, difficulty walking, impaired perceived functional ability, impaired sensation, improper body mechanics, and postural dysfunction.   ACTIVITY LIMITATIONS: carrying, lifting, bending, sitting, standing, squatting, sleeping, stairs, transfers, and bed mobility  PARTICIPATION LIMITATIONS: meal prep, cleaning, laundry, interpersonal relationship, driving, shopping, community activity, and occupation  PERSONAL FACTORS: Time since onset of injury/illness/exacerbation and 1-2 comorbidities: anxiety and focal dystonia are also affecting patient's functional outcome.   REHAB POTENTIAL: Good  CLINICAL DECISION MAKING: Evolving/moderate complexity  EVALUATION COMPLEXITY: High  PLAN:  PT FREQUENCY: 2x/week  PT DURATION: 6 weeks  PLANNED INTERVENTIONS: 02835- PT Re-evaluation, 97750- Physical Performance Testing, 97110-Therapeutic exercises, 97530- Therapeutic activity, 97112- Neuromuscular re-education, 97535- Self Care, 02859- Manual therapy, [phone]- Gait training, [phone]- Aquatic Therapy, [phone]- Electrical stimulation (manual), [phone] (1-2 muscles), 20561 (3+ muscles)- Dry Needling, Patient/Family education, Balance training, Stair training, Taping, Joint mobilization, Spinal mobilization, DME instructions, Cryotherapy, and Moist heat  PLAN FOR NEXT SESSION: assess seated BP, quadruped and tall kneel to work on proximal stability, assess hip abd strength, work on core strengthening, functional movements, resisted gait, blaze pods, cognitive dual tasking (particularly having to speak while doing a physical task), Yoga for functional stretching- warrior pose?, did she look into [employer] for aquatics??, elliptical, can try estim again for R quads and/or R glutes?   Waddell Southgate, PT Waddell Southgate, PT, DPT, CSRS   09/30/2023, 2:01 PM

## 2023-10-03 ENCOUNTER — Ambulatory Visit: Admitting: Speech Pathology

## 2023-10-03 ENCOUNTER — Encounter: Payer: Self-pay | Admitting: Speech Pathology

## 2023-10-03 ENCOUNTER — Ambulatory Visit: Payer: Self-pay | Admitting: Physical Therapy

## 2023-10-03 VITALS — BP 137/101 | HR 91

## 2023-10-03 DIAGNOSIS — R2689 Other abnormalities of gait and mobility: Secondary | ICD-10-CM

## 2023-10-03 DIAGNOSIS — R2681 Unsteadiness on feet: Secondary | ICD-10-CM

## 2023-10-03 DIAGNOSIS — R4789 Other speech disturbances: Secondary | ICD-10-CM | POA: Diagnosis not present

## 2023-10-03 NOTE — Therapy (Signed)
 OUTPATIENT SPEECH LANGUAGE PATHOLOGY TREATMENT NOTE   Patient Name: Lauren Levy MRN: [id number] DOB:Apr 18, 1970, 53 y.o., female Today's Date: 10/03/2023  PCP: Vernon Velna SAUNDERS, MD REFERRING PROVIDER: Vernon Velna SAUNDERS, MD  END OF SESSION:  End of Session - 10/03/23 1324     Visit Number 6    Number of Visits 10    Date for SLP Re-Evaluation 10/26/23    SLP Start Time 1322   arrived late   SLP Stop Time  1400    SLP Time Calculation (min) 38 min    Activity Tolerance Patient tolerated treatment well            Past Medical History:  Diagnosis Date   Ovarian cyst    Past Surgical History:  Procedure Laterality Date   BREAST BIOPSY Right 02/16/2021   papilloma   BREAST LUMPECTOMY Right 04/14/2021   BREAST LUMPECTOMY WITH RADIOACTIVE SEED LOCALIZATION Right 04/14/2021   Procedure: RIGHT BREAST LUMPECTOMY WITH RADIOACTIVE SEED LOCALIZATION;  Surgeon: Vanderbilt Ned, MD;  Location: [facility];  Service: General;  Laterality: Right;   KNEE SURGERY Right    MINOR CARPAL TUNNEL Right    Patient Active Problem List   Diagnosis Date Noted   Anxiety 01/12/2022   Foot pain, right 01/12/2022   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 She reports that her R foot used to turn in and she would have to walk on her toes because her toes would curl under due to this but she had knee surgery to correct it. She reports that recently though her  movements have gotten worse, her whole body will tense up and she is unable to open her eyes and speak at the same time. She reports that speaking is very difficult, she finds herself holding her breath and feels like there is a weight sitting on her chest.   She also reports getting fatigued very easily, even just walking up the stairs at home to get to her bedroom/bathroom she will find herself out of breath. Additionally, when she wakes up first thing in the the morning she does not have any of these symptoms, they come on gradually throughout the day. Consult with movement disorder specialist was negative.   PAIN:  Are you having pain? No  FALLS: Has patient fallen in last 6 months? No, Number of falls: See PT eval  LIVING ENVIRONMENT: Lives with: lives with their family and lives with an adult companion Lives in: House/apartment  PLOF:Level of assistance: Independent with ADLs, Independent with IADLs Employment: Part-time employment  PATIENT GOALS: To speak with ease  OBJECTIVE:  Note: Objective measures were completed at Evaluation unless otherwise noted.  DIAGNOSTIC FINDINGS: 2023 MRI noirmal  COGNITION: Overall cognitive status: Within functional limits for tasks assessed  SOCIAL HISTORY: Occupation: realtor part time Water intake: optimal Caffeine/alcohol intake: minimal Daily voice use: moderate  PERCEPTUAL VOICE ASSESSMENT: Voice quality: harsh, strained, and vocal fatigue Vocal abuse: n/a Resonance: normal Respiratory function: thoracic breathing and clavicular breathing  OBJECTIVE VOICE ASSESSMENT: Maximum phonation time for sustained ah: 5.14 Conversational loudness average: 73 dB Conversational loudness range: 72-74 dB S/z ratio: 1.09 (Suggestive of dysfunction >1.4)  PATIENT REPORTED OUTCOME MEASURES (PROM): VRQOL 32 Rated a 5 or as bad as it can be running out of air when talking, being less outgoing due to voice. A 4 , or a lot of problem speaking  loudly, doing her profession because of her voice and avoiding going out socially because of voice                                                                                                                            TREATMENT DATE:   10/03/23: Pt reports she had a good day Saturday, but has returned to baseline. She enters with dyskinesias, eye closure and facial dyskinesias. She reports she was unable to maintain eye opening over phone calls - review strategy of eyes scanning the room while counting, saying days, months, counting by 5's. Targeted conversational sentences focusing on relaxed body, face - most beneficial to maintain fluent speech with eyes open was to use scanning in various patterns while speaking in simple repeated phrases and sentences. She maintained eyes open 7/10 sentences and in conversation for 4 turns after structured practice.   09/27/23: She was unable to practice her HEP due to spouse breaking his ankle and moving her daughter into her college dorm. In initial conversation, with verbal cues 3x over 5 minutes, she was able to correct closed eyes and continue conversation with eyes open. Structured speech task counting by 5's to 100 forward and backward, saying months backward, she kept eyes open 85% of words with min verbal cues. Progressed to standing, coordinating eyes open, relaxed body humming, syllables, counting and song in nasal cv syllables (ma) - with occasional verbal cues, Lauren Levy completed phonation and speech tasks with eyes open in standing position. HEP see Patient Instructions  09/21/23: Lauren Levy enters with WNL gait, speech and facial expression. When I pointed this out, dyskinesias and eye closure began. Lauren Levy states she has been having episodes of normal speech and facial posture when I don't think about it but when this is called out, her dyskinesia returns. I reinforced this improvement, reviewing strategies to allow her brain to control her body and  speech. Targeted walking and talking with tossing binder clip hand to hand - she was successful for 30 -60 second increments,again reinforcing successful moments as progress. As she is having success at home speaking to family, this week she will make a phone call to a trusted person, focusing on keeping eyes open by scanning the room throughout the call - short call 3-5 minutes twice. She is to practice humming and prolonged syllables while walking to promote coordination of gait, phonation and some articulation daily at home for 3-5 minutes.   8/6/25BETHA Levy reports she has a letter allowing her to do PT with elevated blood pressure - she does some mindfulness but  this is not helping with symptoms. Utilized cutting out aphasia ID cards which did suppress her dystonia and dyskinesia while she was cutting - when she started talking and stopped cutting, mild dyskinesia returned. Targeted sustaining eyes open with hum/sustained phonation for average of 10 seconds over 4 trials, progressed to sustained vowels /I/, /u/for average of 6 seconds with eyes open 3 trials for each vowel. Progressed to cv syllables ma, may, me , my sustaining both phonemes with eyes open 5/5 trials each. Combined nasal syllables to combine 3  - repeating 3 syllalbes 3x with eyes open and occasional min verbal cues. Pt then initiated trial of taking 4 turns in conversation with eyes open for short 5-7 word utterances with occasional min A. Of note, dyskinesias reduced when focus on eyes open during non speech and speech tasks. HEP provided of tasks completed today.   08/24/23: Completed PROM - see above. Lauren Levy reports success with SOVTE. Completed parts of SOVTE with instruction to watch the clock to time 10 seconds in duration to train eyes open with phonation. Cues to complete accents, 1 per second watching the clock for eyes open. Initiated flow phonation stretch syllables watching amplitude of tissue displacement with airflow during  phonation with syllables who, shoe, sue, phrases - she required occasional cues to maintain eyes open for visual feed back of tissue. Flow phrases focusing on  gently exaggerating flow phonemes to reduce tense pressed voice with rare min A and visual cues of flow sounds highlighted. After exercises, Lauren Levy conversed with eyes open 70% of utterances discussing her family and talking while showing me texts on her phone with success. Add stretch flow phonation with tissue to HEP.   08/17/23: Evaluation completed - Initiated HEP for muscle tension dysphonia - Trained in gargle 2 minutes prior to HEP. She completed Semi-occluded Vocal Tract Exercises (SOVTE) in water with straw - 10 second hum completed 7x with occasional min verbal cues and modeling. Pitch glides and accents completed with verbal cues and modeling. Song with usual mod A - Instructed her to watch clock and time 10 seconds with hum to successfully facilitate eyes open with phonation. Education re: tension in throat results in pressed voice.     PATIENT EDUCATION: Education details: HEP, pressed v s flow voice, mindfulness Person educated: Patient Education method: Explanation, Demonstration, and Verbal cues Education comprehension: verbalized understanding, returned demonstration, verbal cues required, and needs further education  HOME EXERCISE PROGRAM: Gargle, SOVTE in water  GOALS: Goals reviewed with patient? Yes  SHORT TERM GOALS: Target date: 09/20/13  Pt will complete HEP with rare min A Baseline: Goal status: ONGOING  2.  Pt will achieve clear phonation in structured speech exercises (resonant and flow) Baseline:  Goal status: ONGOING  3.  Pt will speak with eyes open in structured speech tasks Baseline:  Goal status: ONGOING  4.  Pt will ID 2 triggers for tension dysphonia Baseline:  Goal status: ONGOING  5.  Pt will maintain clear phonation for 3 short turns in conversation with occasional min A Baseline:   Goal status: ONGOING    LONG TERM GOALS: Target date: 10/26/23  Pt will complete HEP for speech/voice with mod I Baseline:  Goal status: ONGOING  2.  Pt will maintain clear phonation and eyes open over 5 minute simple converseation Baseline:  Goal status: ONGOING  3.  Pt will eliminate extraneous oral, neck and body movements during 5 minute conversation Baseline:  Goal status: ONGOING  4.  Pt will improve  score on PROM Baseline:  Goal status: ONGOING   ASSESSMENT:  CLINICAL IMPRESSION: Patient is a 53 y.o. 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 She presents with excess tensio and extraneous movements of body while speaking. She reports she is unable to keep her eyes open when she talks. She is able to speak over the phone better because she keeps her eyes closed and doesn't feel pressure to use eye contact. 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 Dyskinesias of face and body noted throughout evaluation. She was able to keep eyes open with phonation in HEP training. Speech dysfluent apporximately 25% of utterances with aytpical stutter and short 1-2 second blocks. Voice is strained with intermittent breaks/aphonia. I recommend skilled ST to maximize voice quality, intelligibility for safety, success at work and return to [facility]. Lauren Levy   OBJECTIVE IMPAIRMENTS: include voice disorder and speech disorder. These impairments are limiting patient from return to work and effectively communicating at home and in community. Factors affecting potential to achieve goals and functional outcome are co-morbidities.. Patient will benefit from skilled SLP services to address above impairments and improve overall function.  REHAB POTENTIAL: Good  PLAN:  SLP FREQUENCY: 1-2x/week  SLP DURATION: 10 weeks  PLANNED INTERVENTIONS: Environmental controls, Cueing hierachy, Cognitive reorganization, Internal/external aids, Functional tasks,  Multimodal communication approach, SLP instruction and feedback, Compensatory strategies, Patient/family education, [phone] Treatment of speech (30 or 45 min) , and [id number]- Speech Eval Sound Prod, Articulate, Phonological    Luanna Weesner, Leita Caldron, CCC-SLP 10/03/2023, 2:01 PM

## 2023-10-03 NOTE — Therapy (Signed)
 OUTPATIENT PHYSICAL THERAPY NEURO TREATMENT   Patient Name: Lauren Levy MRN: [id number] DOB:06/18/1970, 53 y.o., female Today's Date: 10/03/2023   PCP: Vernon Velna SAUNDERS, MD REFERRING PROVIDER: Hans Cinderella Duck, MD/ recert sent to PCP  END OF SESSION:  PT End of Session - 10/03/23 1402     Visit Number 17    Number of Visits 28   recert   Date for PT Re-Evaluation 11/24/23   recert   Authorization Type [employer]    Authorization Time Period VL:MN    PT Start Time 1400    PT Stop Time 1444    PT Time Calculation (min) 44 min    Equipment Utilized During Treatment Gait belt    Activity Tolerance Other (comment);Patient tolerated treatment well   HTN, improves as session progresses   Behavior During Therapy [facility] for tasks assessed/performed                     Past Medical History:  Diagnosis Date   Ovarian cyst    Past Surgical History:  Procedure Laterality Date   BREAST BIOPSY Right 02/16/2021   papilloma   BREAST LUMPECTOMY Right 04/14/2021   BREAST LUMPECTOMY WITH RADIOACTIVE SEED LOCALIZATION Right 04/14/2021   Procedure: RIGHT BREAST LUMPECTOMY WITH RADIOACTIVE SEED LOCALIZATION;  Surgeon: Vanderbilt Ned, MD;  Location: [facility];  Service: General;  Laterality: Right;   KNEE SURGERY Right    MINOR CARPAL TUNNEL Right    Patient Active Problem List   Diagnosis Date Noted   Anxiety 01/12/2022   Foot pain, right 01/12/2022   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   Dystonia 06/03/2021   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   Gait disturbance 10/09/2020    ONSET DATE: 06/29/2023  REFERRING DIAG: R26.9 (ICD-10-CM) - Unspecified abnormalities of gait and mobility  THERAPY DIAG:  Unsteadiness on feet  Other abnormalities of gait and mobility  Rationale for Evaluation and Treatment: Rehabilitation  SUBJECTIVE:                                                                                                                                                                                              SUBJECTIVE STATEMENT:  Pt reports things are going okay today, Leita with speech therapy really kicked her dianah today but she was doing well by the end of the session, just needs to keep it up. Her R knee is starting to feel better after the lunges.  Asking about using dynadisc at home, provided information on where to purchase online.  Pt was also referred to [facility], has an appt 9/24, with a physician who specializes  in movement disorders.  Pt accompanied by: self  PERTINENT HISTORY: PMH: anxiety, focal dystonia  PAIN:  Are you having pain? No  PRECAUTIONS: None  PATIENT GOALS: see if we can help me talk without closing my eyes, strengthen my back so that I can sit like normal people (slides out of chair when sitting normally), help with my walking                                                                                                                               TREATMENT  Self-Care/Home Management Vitals:   10/03/23 1456 10/03/23 1457  BP: (!) 151/112 (!) 137/101  Pulse: 92 91    Assessed BP in LUE in sitting at rest: 151/112, HR 92  137/101, HR 91  Vitals determined to be within safe limits for participation in physical therapy session this date . Her BP does remain elevated but she has taken BP medication and anti-anxiety medication and her PCP is aware of elevated readings. This therapist comfortable with proceeding with session after second reading obtained.   NMR To work on reciprocal movement patterns: Gait x 230 ft with B walking sticks/trekking poles Increased normalcy of her gait pattern noted though she does have ongoing R quad and HS cramping during gait and has to take several standing rest breaks Resisted gait with purple resistance band: Lateral gait 2 x 25 ft L/R No difficulty, just fatiguing Backwards gait 4 x 25 ft No difficulty, just fatiguing  To work on functional  strengthening of RLE: Resisted 6 and 12 step taps with purple band 3 x 15 reps  To work on gait with narrow BOS on compliant surface and SLS: In // bars with progression to no UE support Tandem gait along blue foam beam 6 x 10 ft Added in alt L/R gumdrop taps 6 x 10 ft    PATIENT EDUCATION: Education details: look into [employer] for walking in water, work on meditation and progressive muscle relaxation at home, continue to work on muscle contraction and relaxation prior to walking, where to Production designer, theatre/television/film Education method: Medical illustrator Education comprehension: verbalized understanding, returned demonstration, and needs further education  HOME EXERCISE PROGRAM: -sit at dinning table for 1 meal with upright posture picturing self in mirror  Start with trying to sit upright x 15 sec without changing position, add 15 sec as this gets easier -work on sit to stands in front of full-length mirror at home, keep eyes open Add in counting up by 1's as this gets easier  -sidelying quad stretch 3 x 30 sec each B (verbally added 6/30, pt declines handout)  Access Code: [email or web address] URL: [email or web address] Date: 08/17/2023 Prepared by: Waddell Southgate  Exercises - Supine Bridge  - 1 x daily - 7 x weekly - 3 sets - 10 reps - Supine Quadricep Sets  - 1 x daily - 7 x weekly - 3 sets - 10 reps - Seated Hip  Adduction Isometrics with Ball  - 1 x daily - 7 x weekly - 3 sets - 10 reps - 5 sec hold  GOALS: Goals reviewed with patient? Yes  SHORT TERM GOALS: Target date: 08/08/2023  Pt will be independent with initial HEP for improved strength, balance, transfers and gait. Baseline: Goal status: MET  2.  Pt will improve gait velocity to at least 3.0 ft/sec for improved gait efficiency and performance at SBA level  Baseline: 2.7 ft/sec SBA no AD (6/6), 3.36 ft/sec mod I no AD (6/30) Goal status: MET  3.  Pt will ambulate greater than or equal to 350 feet on  with LRAD and SBA for improved cardiovascular endurance and BLE strength.  Baseline: 283 ft no AD SBA RPE 10/10 (6/6), 366 ft no AD mod I RPE 10/10 (6/30) Goal status: MET    LONG TERM GOALS: Target date: 08/29/2023   Pt will be independent with final HEP for improved strength, balance, transfers and gait. Baseline:  Goal status: IN PROGRESS  2.  Pt will improve gait velocity to at least 3.5 ft/sec for improved gait efficiency and performance at mod I level  Baseline: 2.7 ft/sec SBA no AD (6/6), 3.36 ft/sec mod I no AD (6/30), 2.56 ft/sec mod I no AD (8/6) Goal status: NOT MET  3.  Pt will ambulate greater than or equal to 450 feet on with LRAD and SBA for improved cardiovascular endurance and BLE strength.  Baseline: 283 ft no AD SBA RPE 10/10 (6/6), 366 ft no AD mod I RPE 10/10 (6/30), 323 ft no AD mod I RPE 20/10 (8/6) Goal status: NOT MET  4.  Pt will demonstrate a good understanding of her diagnosis (FND/FMD) and resources available to her. Baseline: received a referral to [facility] movement disorder specialist - appt 9/24 Goal status: NOT MET   NEW SHORT TERM GOALS:   Target date: 10/13/2023   Pt will be independent with progressive HEP for improved strength, balance, transfers and gait. Baseline:  Goal status: IN PROGRESS  2.  Pt will improve gait velocity to at least 3.0 ft/sec for improved gait efficiency and performance at mod I level  Baseline: 2.7 ft/sec SBA no AD (6/6), 3.36 ft/sec mod I no AD (6/30), 2.56 ft/sec mod I no AD (8/6) Goal status: REVISED/DOWNGRADED  3.  Pt will ambulate greater than or equal to 400 feet on with LRAD and SBA for improved cardiovascular endurance and BLE strength.  Baseline: 283 ft no AD SBA RPE 10/10 (6/6), 366 ft no AD mod I RPE 10/10 (6/30), 323 ft no AD mod I RPE 20/10 (8/6) Goal status: REVISED/DOWNGRADED  4.  Pt will demonstrate good understanding of at least 3 stress relief/stress management techniques in  order to better manage her anxiety and lower her blood pressure. Baseline:  Goal status: INITIAL   NEW LONG TERM GOALS:  Target date: 11/10/2023   Pt will be independent with final HEP for improved strength, balance, transfers and gait. Baseline:  Goal status: IN PROGRESS  2.  Pt will improve gait velocity to at least 3.5 ft/sec for improved gait efficiency and performance at mod I level  Baseline: 2.7 ft/sec SBA no AD (6/6), 3.36 ft/sec mod I no AD (6/30), 2.56 ft/sec mod I no AD (8/6) Goal status: IN PROGRESS  3.  Pt will ambulate greater than or equal to 450 feet on with LRAD and SBA for improved cardiovascular endurance and BLE strength.  Baseline: 283  ft no AD SBA RPE 10/10 (6/6), 366 ft no AD mod I RPE 10/10 (6/30), 323 ft no AD mod I RPE 20/10 (8/6) Goal status: IN PROGRESS  4.  Pt will demonstrate a good understanding of her diagnosis (FND/FMD) and resources available to her. Baseline: received a referral to [facility] movement disorder specialist - appt 9/24 Goal status: IN PROGRESS     ASSESSMENT:  CLINICAL IMPRESSION: Emphasis of skilled PT session on continuing to monitor vitals and working on normalizing movement patterns, reciprocal movement patterns, functional strengthening, and SLS. Pt continues to present with elevated BP but she is on medication for anxiety and to address her elevated BP. She also continues to struggle to keep her eyes open while engaging in conversation or engaging in physical tasks though this is improving. She also continues to exhibit increased pelvic and trunk rotation during gait and onset of R quad cramping with forwards gait, does slightly decrease with use of trekking poles during gait. She could benefit from further trials of gait with trekking poles as well as continued trials of estim in future sessions. She continues to benefit from skilled PT services to work towards improving her function and normalizing her movement patterns.  Continue POC.   OBJECTIVE IMPAIRMENTS: Abnormal gait, cardiopulmonary status limiting activity, decreased activity tolerance, decreased balance, decreased coordination, decreased endurance, decreased knowledge of condition, decreased knowledge of use of DME, decreased mobility, difficulty walking, impaired perceived functional ability, impaired sensation, improper body mechanics, and postural dysfunction.   ACTIVITY LIMITATIONS: carrying, lifting, bending, sitting, standing, squatting, sleeping, stairs, transfers, and bed mobility  PARTICIPATION LIMITATIONS: meal prep, cleaning, laundry, interpersonal relationship, driving, shopping, community activity, and occupation  PERSONAL FACTORS: Time since onset of injury/illness/exacerbation and 1-2 comorbidities: anxiety and focal dystonia are also affecting patient's functional outcome.   REHAB POTENTIAL: Good  CLINICAL DECISION MAKING: Evolving/moderate complexity  EVALUATION COMPLEXITY: High  PLAN:  PT FREQUENCY: 2x/week  PT DURATION: 6 weeks  PLANNED INTERVENTIONS: 02835- PT Re-evaluation, 97750- Physical Performance Testing, 97110-Therapeutic exercises, 97530- Therapeutic activity, 97112- Neuromuscular re-education, 97535- Self Care, 02859- Manual therapy, [phone]- Gait training, [phone]- Aquatic Therapy, [phone]- Electrical stimulation (manual), [phone] (1-2 muscles), [phone] (3+ muscles)- Dry Needling, Patient/Family education, Balance training, Stair training, Taping, Joint mobilization, Spinal mobilization, DME instructions, Cryotherapy, and Moist heat  PLAN FOR NEXT SESSION: assess seated BP, quadruped and tall kneel to work on proximal stability, assess hip abd strength, work on core strengthening, functional movements, resisted gait, blaze pods, cognitive dual tasking (particularly having to speak while doing a physical task), Yoga for functional stretching- warrior pose?, did she look into [employer] for aquatics??, elliptical, can try  estim again for R quads and/or R glutes? Trekking poles   Waddell Southgate, PT Waddell Southgate, PT, DPT, CSRS   10/03/2023, 2:57 PM

## 2023-10-05 ENCOUNTER — Ambulatory Visit: Payer: Self-pay

## 2023-10-05 ENCOUNTER — Ambulatory Visit: Admitting: Speech Pathology

## 2023-10-05 ENCOUNTER — Telehealth: Payer: Self-pay | Admitting: Speech Pathology

## 2023-10-05 NOTE — Telephone Encounter (Signed)
 Left vm re: missed ST appointment today - reminded her of PT appointment today and her next PT appointment 9/2. Instructed her to call front office per our attendance policy

## 2023-10-11 ENCOUNTER — Ambulatory Visit: Payer: Self-pay | Attending: Neurology | Admitting: Physical Therapy

## 2023-10-11 VITALS — BP 151/108 | HR 93

## 2023-10-11 DIAGNOSIS — R258 Other abnormal involuntary movements: Secondary | ICD-10-CM | POA: Insufficient documentation

## 2023-10-11 DIAGNOSIS — R2681 Unsteadiness on feet: Secondary | ICD-10-CM | POA: Insufficient documentation

## 2023-10-11 DIAGNOSIS — R2689 Other abnormalities of gait and mobility: Secondary | ICD-10-CM | POA: Insufficient documentation

## 2023-10-11 NOTE — Therapy (Signed)
 OUTPATIENT PHYSICAL THERAPY NEURO TREATMENT   Patient Name: LAURIEANNE GALLOWAY MRN: [id number] DOB:05-Dec-1970, 53 y.o., female Today's Date: 10/11/2023   PCP: Vernon Velna SAUNDERS, MD REFERRING PROVIDER: Hans Cinderella Duck, MD/ recert sent to PCP  END OF SESSION:  PT End of Session - 10/11/23 1149     Visit Number 18    Number of Visits 28   recert   Date for PT Re-Evaluation 11/24/23   recert   Authorization Type Aetna    Authorization Time Period VL:MN    PT Start Time 1148   pt arrived late   PT Stop Time 1230    PT Time Calculation (min) 42 min    Equipment Utilized During Treatment Gait belt    Activity Tolerance Other (comment);Patient tolerated treatment well   HTN, improves as session progresses   Behavior During Therapy [facility] for tasks assessed/performed                      Past Medical History:  Diagnosis Date   Ovarian cyst    Past Surgical History:  Procedure Laterality Date   BREAST BIOPSY Right 02/16/2021   papilloma   BREAST LUMPECTOMY Right 04/14/2021   BREAST LUMPECTOMY WITH RADIOACTIVE SEED LOCALIZATION Right 04/14/2021   Procedure: RIGHT BREAST LUMPECTOMY WITH RADIOACTIVE SEED LOCALIZATION;  Surgeon: Vanderbilt Ned, MD;  Location: [facility];  Service: General;  Laterality: Right;   KNEE SURGERY Right    MINOR CARPAL TUNNEL Right    Patient Active Problem List   Diagnosis Date Noted   Anxiety 01/12/2022   Foot pain, right 01/12/2022   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   Dystonia 06/03/2021   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   Gait disturbance 10/09/2020    ONSET DATE: 06/29/2023  REFERRING DIAG: R26.9 (ICD-10-CM) - Unspecified abnormalities of gait and mobility  THERAPY DIAG:  Unsteadiness on feet  Other abnormalities of gait and mobility  Rationale for Evaluation and Treatment: Rehabilitation  SUBJECTIVE:                                                                                                                                                                                              SUBJECTIVE STATEMENT:  Pt had a really rough day last week where she was so tired and had no energy and decided at the last minute that she couldn't do therapy, was too embarrassed to call to cancel. She just didn't have it in her. Pt denies any falls since last visit, no pain today.  Pt reports that she continues to struggle with her HEP (keeping eyes open and speaking). She was  doing better with it then it got worse, she is working on getting better with it.  Pt was also referred to [facility] Neurology, has an appt 9/24, with a physician who specializes in movement disorders.  Pt accompanied by: self  PERTINENT HISTORY: PMH: anxiety, focal dystonia  PAIN:  Are you having pain? No  PRECAUTIONS: None  PATIENT GOALS: see if we can help me talk without closing my eyes, strengthen my back so that I can sit like normal people (slides out of chair when sitting normally), help with my walking                                                                                                                               TREATMENT  Self-Care/Home Management Vitals:   10/11/23 1204  BP: (!) 151/108  Pulse: 93    Assessed BP in LUE in sitting at rest: 151/108, HR 93  Vitals determined to be within safe limits for participation in physical therapy session this date . Her BP does remain elevated but she has taken BP medication and anti-anxiety medication and her PCP is aware of elevated readings. This therapist comfortable with proceeding with session after reading obtained.   NMR Prior to to address muscle tightness and cramping that she gets during ambulation: While seated on green dynadisc: Seated B LAQ x 10 reps B with 5 sec hold with focus on quad contraction Seated hip add squeeze on pillow x 10 reps with 5 sec hold Seated B glute squeezes x 10 reps with 5 sec hold  To work on functional and proximal  strengthening: In quadruped on mat table: Alt LE lifts x 10 reps B Added in UE/LE lifts x 10 reps B In half kneel on red mat on floor L/R half kneel 3 x 30 sec each no UE support More difficulty stabilizing on RLE as compared to LLE In tall kneel on red mat on floor: Lateral sidestepping Added in eyes open Added in counting up by 2, then up by 3 Significant difficulty counting up by 3  Discussed stress management techniques: Current for stress management pt does the following: gardening in evening, listens to music from talk therapist, spends along time in her room on her computer; mostly gets stress when she starts speaking so feels very relaxed when by herself.  Physical Performance For STG assessment: : 233 ft, 3 standing rest breaks   [facility] PT Assessment - 10/11/23 1157       Ambulation/Gait   Gait velocity 32.8 ft over 10.16 sec = 3.23 ft/sec            PATIENT EDUCATION: Education details: results of OM and functional implications, continue to work on HEP and trying to keep eyes open during a functional task at home while counting or naming something Education method: Explanation and Demonstration Education comprehension: verbalized understanding, returned demonstration, and needs further education  HOME EXERCISE PROGRAM: -sit at dinning table  for 1 meal with upright posture picturing self in mirror  Start with trying to sit upright x 15 sec without changing position, add 15 sec as this gets easier -work on sit to stands in front of full-length mirror at home, keep eyes open Add in counting up by 1's as this gets easier  -sidelying quad stretch 3 x 30 sec each B (verbally added 6/30, pt declines handout)  Access Code: [email or web address] URL: [email or web address] Date: 08/17/2023 Prepared by: Waddell Southgate  Exercises - Supine Bridge  - 1 x daily - 7 x weekly - 3 sets - 10 reps - Supine Quadricep Sets  - 1 x daily - 7 x weekly - 3 sets - 10 reps -  Seated Hip Adduction Isometrics with Ball  - 1 x daily - 7 x weekly - 3 sets - 10 reps - 5 sec hold   GOALS: Goals reviewed with patient? Yes  SHORT TERM GOALS: Target date: 08/08/2023  Pt will be independent with initial HEP for improved strength, balance, transfers and gait. Baseline: Goal status: MET  2.  Pt will improve gait velocity to at least 3.0 ft/sec for improved gait efficiency and performance at SBA level  Baseline: 2.7 ft/sec SBA no AD (6/6), 3.36 ft/sec mod I no AD (6/30) Goal status: MET  3.  Pt will ambulate greater than or equal to 350 feet on with LRAD and SBA for improved cardiovascular endurance and BLE strength.  Baseline: 283 ft no AD SBA RPE 10/10 (6/6), 366 ft no AD mod I RPE 10/10 (6/30) Goal status: MET    LONG TERM GOALS: Target date: 08/29/2023   Pt will be independent with final HEP for improved strength, balance, transfers and gait. Baseline:  Goal status: IN PROGRESS  2.  Pt will improve gait velocity to at least 3.5 ft/sec for improved gait efficiency and performance at mod I level  Baseline: 2.7 ft/sec SBA no AD (6/6), 3.36 ft/sec mod I no AD (6/30), 2.56 ft/sec mod I no AD (8/6) Goal status: NOT MET  3.  Pt will ambulate greater than or equal to 450 feet on with LRAD and SBA for improved cardiovascular endurance and BLE strength.  Baseline: 283 ft no AD SBA RPE 10/10 (6/6), 366 ft no AD mod I RPE 10/10 (6/30), 323 ft no AD mod I RPE 20/10 (8/6) Goal status: NOT MET  4.  Pt will demonstrate a good understanding of her diagnosis (FND/FMD) and resources available to her. Baseline: received a referral to [facility] Neurology movement disorder specialist - appt 9/24 Goal status: NOT MET   NEW SHORT TERM GOALS:   Target date: 10/13/2023   Pt will be independent with progressive HEP for improved strength, balance, transfers and gait. Baseline:  Goal status: MET  2.  Pt will improve gait velocity to at least 3.0 ft/sec for improved gait  efficiency and performance at mod I level  Baseline: 2.7 ft/sec SBA no AD (6/6), 3.36 ft/sec mod I no AD (6/30), 2.56 ft/sec mod I no AD (8/6), 3.23 ft/sec mod I no AD (9/2) Goal status: MET  3.  Pt will ambulate greater than or equal to 400 feet on with LRAD and SBA for improved cardiovascular endurance and BLE strength.  Baseline: 283 ft no AD SBA RPE 10/10 (6/6), 366 ft no AD mod I RPE 10/10 (6/30), 323 ft no AD mod I RPE 20/10 (8/6), 233 ft no AD mod I (9/2) Goal status: NOT MET  4.  Pt will demonstrate good understanding of at least 3 stress relief/stress management techniques in order to better manage her anxiety and lower her blood pressure. Baseline: gardening in evening, listens to music from talk therapist, spends along time in her room on her computer; mostly gets stress when she starts speaking so feels very relaxed when by herself. Goal status: MET   NEW LONG TERM GOALS:  Target date: 11/10/2023   Pt will be independent with final HEP for improved strength, balance, transfers and gait. Baseline:  Goal status: IN PROGRESS  2.  Pt will improve gait velocity to at least 3.5 ft/sec for improved gait efficiency and performance at mod I level  Baseline: 2.7 ft/sec SBA no AD (6/6), 3.36 ft/sec mod I no AD (6/30), 2.56 ft/sec mod I no AD (8/6), 3.23 ft/sec mod I no AD (9/2) Goal status: IN PROGRESS  3.  Pt will ambulate greater than or equal to 450 feet on with LRAD and SBA for improved cardiovascular endurance and BLE strength.  Baseline: 283 ft no AD SBA RPE 10/10 (6/6), 366 ft no AD mod I RPE 10/10 (6/30), 323 ft no AD mod I RPE 20/10 (8/6), 233 ft no AD mod I (9/2) Goal status: IN PROGRESS  4.  Pt will demonstrate a good understanding of her diagnosis (FND/FMD) and resources available to her. Baseline: received a referral to [facility] Neurology movement disorder specialist - appt 9/24 Goal status: IN PROGRESS     ASSESSMENT:  CLINICAL IMPRESSION: Emphasis of  skilled PT session on continuing to monitor vitals and working on functional strengthening as well as STG assessment. Pt's BP remains elevated during PT sessions but she is being medically managed by her PCP and her readings at home remain within safe limits (108/88 this AM). She also continues to struggle to keep her eyes open while engaging in conversation or engaging in physical tasks though this is improving. She has met 3/4 STG due to being independent with her initial HEP, improving her gait speed to 3.23 ft/sec, and being able to name at least 3 methods for stress management that she utilizes at home. She exhibits decreased distance covered during the as compared to previous assessments even with focus on muscle contract/relax prior to performance of test. She continues to be limited by her R quads tightening up during gait and continues to feel like she has to fight her muscles. She continues to benefit from skilled PT services to work towards improving her function and normalizing her movement patterns. Continue POC.   OBJECTIVE IMPAIRMENTS: Abnormal gait, cardiopulmonary status limiting activity, decreased activity tolerance, decreased balance, decreased coordination, decreased endurance, decreased knowledge of condition, decreased knowledge of use of DME, decreased mobility, difficulty walking, impaired perceived functional ability, impaired sensation, improper body mechanics, and postural dysfunction.   ACTIVITY LIMITATIONS: carrying, lifting, bending, sitting, standing, squatting, sleeping, stairs, transfers, and bed mobility  PARTICIPATION LIMITATIONS: meal prep, cleaning, laundry, interpersonal relationship, driving, shopping, community activity, and occupation  PERSONAL FACTORS: Time since onset of injury/illness/exacerbation and 1-2 comorbidities: anxiety and focal dystonia are also affecting patient's functional outcome.   REHAB POTENTIAL: Good  CLINICAL DECISION MAKING:  Evolving/moderate complexity  EVALUATION COMPLEXITY: High  PLAN:  PT FREQUENCY: 2x/week  PT DURATION: 6 weeks  PLANNED INTERVENTIONS: 97164- PT Re-evaluation, 97750- Physical Performance Testing, 97110-Therapeutic exercises, 97530- Therapeutic activity, [id number]- Neuromuscular re-education, 97535- Self Care, [id number]- Manual therapy, [id number]- Gait training, [id number]- Aquatic Therapy, [phone]- Electrical stimulation (manual), [id number] (1-2 muscles), 20561 (3+ muscles)- Dry  Needling, Patient/Family education, Balance training, Stair training, Taping, Joint mobilization, Spinal mobilization, DME instructions, Cryotherapy, and Moist heat  PLAN FOR NEXT SESSION: assess seated BP, quadruped and tall kneel to work on proximal stability, hip abd strength, work on core strengthening, functional movements, resisted gait, blaze pods, cognitive dual tasking (particularly having to speak while doing a physical task), Yoga for functional stretching- warrior pose?, did she look into [employer] for aquatics??, elliptical, can try estim again for R quads and/or R glutes? Trekking poles, RLE SLS?   Johnmatthew Solorio, PT Waddell Southgate, PT, DPT, CSRS   10/11/2023, 12:35 PM

## 2023-10-14 ENCOUNTER — Ambulatory Visit: Payer: Self-pay

## 2023-10-14 VITALS — BP 145/107 | HR 92

## 2023-10-14 DIAGNOSIS — R2689 Other abnormalities of gait and mobility: Secondary | ICD-10-CM | POA: Diagnosis not present

## 2023-10-14 DIAGNOSIS — R258 Other abnormal involuntary movements: Secondary | ICD-10-CM | POA: Diagnosis not present

## 2023-10-14 DIAGNOSIS — R2681 Unsteadiness on feet: Secondary | ICD-10-CM

## 2023-10-14 NOTE — Therapy (Signed)
 OUTPATIENT PHYSICAL THERAPY NEURO TREATMENT   Patient Name: Lauren Levy MRN: [id number] DOB:1970-12-09, 53 y.o., female Today's Date: 10/14/2023   PCP: Vernon Velna SAUNDERS, MD REFERRING PROVIDER: Hans Cinderella Duck, MD/ recert sent to PCP  END OF SESSION:  PT End of Session - 10/14/23 1404     Visit Number 19    Number of Visits 28    Date for PT Re-Evaluation 11/24/23    Authorization Type Aetna    Authorization Time Period VL:MN    PT Start Time 1402    PT Stop Time 1445    PT Time Calculation (min) 43 min    Equipment Utilized During Treatment Gait belt    Activity Tolerance Patient tolerated treatment well    Behavior During Therapy WFL for tasks assessed/performed           Past Medical History:  Diagnosis Date   Ovarian cyst    Past Surgical History:  Procedure Laterality Date   BREAST BIOPSY Right 02/16/2021   papilloma   BREAST LUMPECTOMY Right 04/14/2021   BREAST LUMPECTOMY WITH RADIOACTIVE SEED LOCALIZATION Right 04/14/2021   Procedure: RIGHT BREAST LUMPECTOMY WITH RADIOACTIVE SEED LOCALIZATION;  Surgeon: Vanderbilt Ned, MD;  Location: [facility];  Service: General;  Laterality: Right;   KNEE SURGERY Right    MINOR CARPAL TUNNEL Right    Patient Active Problem List   Diagnosis Date Noted   Anxiety 01/12/2022   Foot pain, right 01/12/2022   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 Hopeful they will scan her brain. Endorses frustration that now she can no longer speak with her eyes open. States she was able to until she had a stressful week. Denies falls.   Pt accompanied by: self  PERTINENT HISTORY: PMH: anxiety, focal dystonia  PAIN:  Are you having pain? No  PRECAUTIONS: None  PATIENT GOALS: see if we can help me talk without closing my eyes, strengthen my back so that I can sit like normal people (slides out of chair when sitting normally), help with my  walking                                                                                                                               TREATMENT  Self-Care/Home Management Vitals:   10/14/23 1409  BP: (!) 145/107  Pulse: 92   Assessed BP in LUE in sitting at rest:  NMR: -Treadmill 0.2mph with 5lb ankle weights BLE for error augmentation   -green TB for external cues of forward LE advancement and step height   -10/10 RPE afterward; tolerated a total of 4:45s  -gait pattern: decreased B foot clearance, shortened B step length, excessive L anterior hip rotation, flat foot contact -prolonged ITB stretch in sidelying   -patient unable to tolerate supine stretch due to need to arch back   PATIENT EDUCATION: Education details: additions to HEP Education method: Explanation and Demonstration Education comprehension: verbalized understanding, returned demonstration, and needs further education  HOME EXERCISE PROGRAM: -sit at dinning table for 1 meal with upright posture picturing self in mirror  Start with trying to sit upright x 15 sec without changing position, add 15 sec as this gets easier -work on sit to stands in front of full-length mirror at home, keep eyes open Add in counting up by 1's as this gets easier  -sidelying quad stretch 3 x 30 sec each B (verbally added 6/30, pt declines handout)  Access Code: [email or web address] URL:  [email or web address] Date: 08/17/2023 Prepared by: Waddell Southgate  Exercises - Supine Bridge  - 1 x daily - 7 x weekly - 3 sets - 10 reps - Supine Quadricep Sets  - 1 x daily - 7 x weekly - 3 sets - 10 reps - Seated Hip Adduction Isometrics with Ball  - 1 x daily - 7 x weekly - 3 sets - 10 reps - 5 sec hold - Sidelying ITB Stretch off Table  - 1 x daily - 7 x weekly - 3 sets - 30s hold - Supine ITB Stretch with Strap  - 1 x daily - 7 x weekly - 3 sets - 30s hold   GOALS: Goals reviewed with patient? Yes  SHORT TERM GOALS: Target date: 08/08/2023  Pt will be independent with initial HEP for improved strength, balance, transfers and gait. Baseline: Goal status: MET  2.  Pt will improve gait velocity to at least 3.0 ft/sec for improved gait efficiency and performance at SBA level  Baseline: 2.7 ft/sec SBA no AD (6/6), 3.36 ft/sec mod I no AD (6/30) Goal status: MET  3.  Pt will ambulate greater than or equal to 350 feet on with LRAD and SBA for improved cardiovascular endurance and BLE strength.  Baseline: 283 ft no AD SBA RPE 10/10 (6/6), 366 ft no AD mod I RPE 10/10 (6/30) Goal status: MET    LONG TERM GOALS: Target date: 08/29/2023   Pt will be independent with final HEP for improved strength, balance, transfers and gait. Baseline:  Goal  status: IN PROGRESS  2.  Pt will improve gait velocity to at least 3.5 ft/sec for improved gait efficiency and performance at mod I level  Baseline: 2.7 ft/sec SBA no AD (6/6), 3.36 ft/sec mod I no AD (6/30), 2.56 ft/sec mod I no AD (8/6) Goal status: NOT MET  3.  Pt will ambulate greater than or equal to 450 feet on with LRAD and SBA for improved cardiovascular endurance and BLE strength.  Baseline: 283 ft no AD SBA RPE 10/10 (6/6), 366 ft no AD mod I RPE 10/10 (6/30), 323 ft no AD mod I RPE 20/10 (8/6) Goal status: NOT MET  4.  Pt will demonstrate a good understanding of her diagnosis (FND/FMD) and resources  available to her. Baseline: received a referral to [facility] Neurology movement disorder specialist - appt 9/24 Goal status: NOT MET   NEW SHORT TERM GOALS:   Target date: 10/13/2023   Pt will be independent with progressive HEP for improved strength, balance, transfers and gait. Baseline:  Goal status: MET  2.  Pt will improve gait velocity to at least 3.0 ft/sec for improved gait efficiency and performance at mod I level  Baseline: 2.7 ft/sec SBA no AD (6/6), 3.36 ft/sec mod I no AD (6/30), 2.56 ft/sec mod I no AD (8/6), 3.23 ft/sec mod I no AD (9/2) Goal status: MET  3.  Pt will ambulate greater than or equal to 400 feet on with LRAD and SBA for improved cardiovascular endurance and BLE strength.  Baseline: 283 ft no AD SBA RPE 10/10 (6/6), 366 ft no AD mod I RPE 10/10 (6/30), 323 ft no AD mod I RPE 20/10 (8/6), 233 ft no AD mod I (9/2) Goal status: NOT MET  4.  Pt will demonstrate good understanding of at least 3 stress relief/stress management techniques in order to better manage her anxiety and lower her blood pressure. Baseline: gardening in evening, listens to music from talk therapist, spends along time in her room on her computer; mostly gets stress when she starts speaking so feels very relaxed when by herself. Goal status: MET   NEW LONG TERM GOALS:  Target date: 11/10/2023   Pt will be independent with final HEP for improved strength, balance, transfers and gait. Baseline:  Goal status: IN PROGRESS  2.  Pt will improve gait velocity to at least 3.5 ft/sec for improved gait efficiency and performance at mod I level  Baseline: 2.7 ft/sec SBA no AD (6/6), 3.36 ft/sec mod I no AD (6/30), 2.56 ft/sec mod I no AD (8/6), 3.23 ft/sec mod I no AD (9/2) Goal status: IN PROGRESS  3.  Pt will ambulate greater than or equal to 450 feet on with LRAD and SBA for improved cardiovascular endurance and BLE strength.  Baseline: 283 ft no AD SBA RPE 10/10 (6/6), 366 ft no AD mod I  RPE 10/10 (6/30), 323 ft no AD mod I RPE 20/10 (8/6), 233 ft no AD mod I (9/2) Goal status: IN PROGRESS  4.  Pt will demonstrate a good understanding of her diagnosis (FND/FMD) and resources available to her. Baseline: received a referral to [facility] Neurology movement disorder specialist - appt 9/24 Goal status: IN PROGRESS     ASSESSMENT:  CLINICAL IMPRESSION: Patient seen for skilled PT session with emphasis on gross NMR. Able to tolerate treadmill gait with ankle weights for error augmentation for ~45s-1:30 at a time before reporting increased R ITB tightness, requesting to stop. PT palpating minimal R ITB  tone. Observable increase in gait deviations in R LE> L LE. Tolerated sidelying ITB much better than supine. Continue POC.    OBJECTIVE IMPAIRMENTS: Abnormal gait, cardiopulmonary status limiting activity, decreased activity tolerance, decreased balance, decreased coordination, decreased endurance, decreased knowledge of condition, decreased knowledge of use of DME, decreased mobility, difficulty walking, impaired perceived functional ability, impaired sensation, improper body mechanics, and postural dysfunction.   ACTIVITY LIMITATIONS: carrying, lifting, bending, sitting, standing, squatting, sleeping, stairs, transfers, and bed mobility  PARTICIPATION LIMITATIONS: meal prep, cleaning, laundry, interpersonal relationship, driving, shopping, community activity, and occupation  PERSONAL FACTORS: Time since onset of injury/illness/exacerbation and 1-2 comorbidities: anxiety and focal dystonia are also affecting patient's functional outcome.   REHAB POTENTIAL: Good  CLINICAL DECISION MAKING: Evolving/moderate complexity  EVALUATION COMPLEXITY: High  PLAN:  PT FREQUENCY: 2x/week  PT DURATION: 6 weeks  PLANNED INTERVENTIONS: 02835- PT Re-evaluation, 97750- Physical Performance Testing, 97110-Therapeutic exercises, 97530- Therapeutic activity, 97112- Neuromuscular re-education,  97535- Self Care, 02859- Manual therapy, [phone]- Gait training, [phone]- Aquatic Therapy, [phone]- Electrical stimulation (manual), [phone] (1-2 muscles), 20561 (3+ muscles)- Dry Needling, Patient/Family education, Balance training, Stair training, Taping, Joint mobilization, Spinal mobilization, DME instructions, Cryotherapy, and Moist heat  PLAN FOR NEXT SESSION: assess seated BP, quadruped and tall kneel to work on proximal stability, hip abd strength, work on core strengthening, functional movements, resisted gait, blaze pods, cognitive dual tasking (particularly having to speak while doing a physical task), Yoga for functional stretching- warrior pose?, did she look into [employer] for aquatics??, elliptical, can try estim again for R quads and/or R glutes? Trekking poles, RLE SLS?resisted EVA walker   Delon DELENA Pop, PT Delon DELENA Pop, PT, DPT, CBIS   10/14/2023, 3:39 PM

## 2023-10-17 ENCOUNTER — Ambulatory Visit: Payer: Self-pay

## 2023-10-19 ENCOUNTER — Ambulatory Visit: Payer: Self-pay

## 2023-10-20 DIAGNOSIS — F411 Generalized anxiety disorder: Secondary | ICD-10-CM | POA: Diagnosis not present

## 2023-10-24 ENCOUNTER — Ambulatory Visit: Payer: Self-pay

## 2023-10-24 VITALS — BP 147/112 | HR 96

## 2023-10-24 DIAGNOSIS — R2681 Unsteadiness on feet: Secondary | ICD-10-CM

## 2023-10-24 DIAGNOSIS — M25552 Pain in left hip: Secondary | ICD-10-CM | POA: Diagnosis not present

## 2023-10-24 DIAGNOSIS — R2689 Other abnormalities of gait and mobility: Secondary | ICD-10-CM

## 2023-10-24 DIAGNOSIS — R258 Other abnormal involuntary movements: Secondary | ICD-10-CM

## 2023-10-24 NOTE — Therapy (Signed)
 OUTPATIENT PHYSICAL THERAPY NEURO TREATMENT/ ARRIVED NO CHARGE   Patient Name: Lauren Levy MRN: [id number] DOB:[birth year], 53 y.o., female Today's Date: 10/24/2023   PCP: Vernon Velna SAUNDERS, MD REFERRING PROVIDER: Hans Cinderella Duck, MD/ recert sent to PCP  END OF SESSION:  PT End of Session - 10/24/23 1142     Visit Number 19    Number of Visits 28    Date for PT Re-Evaluation 11/24/23    Authorization Type [employer]    Authorization Time Period [id number]    PT Start Time 1140    PT Stop Time 1152   arrived no charge   PT Time Calculation (min) 12 min    Activity Tolerance Treatment limited secondary to medical complications (Comment)   HTN   Behavior During Therapy Flat affect;Lability           Past Medical History:  Diagnosis Date   Ovarian cyst    Past Surgical History:  Procedure Laterality Date   BREAST BIOPSY Right 02/16/2021   papilloma   BREAST LUMPECTOMY Right 04/14/2021   BREAST LUMPECTOMY WITH RADIOACTIVE SEED LOCALIZATION Right 04/14/2021   Procedure: RIGHT BREAST LUMPECTOMY WITH RADIOACTIVE SEED LOCALIZATION;  Surgeon: Vanderbilt Ned, MD;  Location: [facility];  Service: General;  Laterality: Right;   KNEE SURGERY Right    MINOR CARPAL TUNNEL Right    Patient Active Problem List   Diagnosis Date Noted   Anxiety 01/12/2022   Foot pain, right 01/12/2022   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   Dystonia 06/03/2021   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 Reports still not feeling well and increased difficulty with daily mobility as she hasn't been able to do much while sick. Denies falls.   Pt accompanied by: self  PERTINENT HISTORY: PMH: anxiety, focal dystonia  PAIN:  Are you having pain? No  PRECAUTIONS: None  PATIENT GOALS: see if we can help me talk without closing my eyes, strengthen my back so that I can sit like normal people (slides out of chair when sitting normally), help with  my walking                                                                                                                               TREATMENT  Self-Care/Home Management Vitals:   10/24/23 1144 10/24/23 1159  BP: (!) 138/112 (!) 147/112  Pulse: 96 96  Arrived no charge   PATIENT EDUCATION: Education details: continue HEP, PT POC Education method: Explanation and Demonstration Education comprehension: verbalized understanding, returned demonstration, and needs further education  HOME EXERCISE PROGRAM: -sit at dinning table for 1 meal with upright posture picturing self in mirror  Start with trying to sit upright x 15 sec without changing position, add 15 sec as this gets easier -work on sit to stands in front of full-length mirror at home, keep eyes open Add in counting up by 1's as this gets easier  -sidelying quad stretch 3 x 30 sec each B (verbally added 6/30, pt declines handout)  Access Code: [email or web address] URL: [email or web address] Date: 08/17/2023 Prepared by: Waddell Southgate  Exercises - Supine Bridge  - 1 x daily - 7 x weekly - 3 sets - 10 reps - Supine Quadricep Sets  - 1 x daily - 7 x weekly - 3 sets - 10 reps - Seated Hip Adduction Isometrics with Ball  - 1 x daily - 7 x weekly - 3 sets - 10 reps - 5 sec hold - Sidelying ITB Stretch off Table  - 1 x daily - 7 x weekly - 3 sets - 30s hold - Supine ITB Stretch  with Strap  - 1 x daily - 7 x weekly - 3 sets - 30s hold   GOALS: Goals reviewed with patient? Yes  SHORT TERM GOALS: Target date: 08/08/2023  Pt will be independent with initial HEP for improved strength, balance, transfers and gait. Baseline: Goal status: MET  2.  Pt will improve gait velocity to at least 3.0 ft/sec for improved gait efficiency and performance at SBA level  Baseline: 2.7 ft/sec SBA no AD (6/6), 3.36 ft/sec mod I no AD (6/30) Goal status: MET  3.  Pt will ambulate greater than or equal to 350 feet on with LRAD and SBA for improved cardiovascular endurance and BLE strength.  Baseline: 283 ft no AD SBA RPE 10/10 (6/6), 366 ft no AD mod I RPE 10/10 (6/30) Goal status: MET    LONG TERM GOALS: Target date: 08/29/2023   Pt will be independent with final HEP for improved strength, balance, transfers and gait. Baseline:  Goal status: IN PROGRESS  2.  Pt will improve gait velocity to at least 3.5 ft/sec for improved gait efficiency and performance at mod I level  Baseline: 2.7 ft/sec SBA no AD (6/6), 3.36 ft/sec mod I no AD (6/30), 2.56 ft/sec mod I no AD (8/6) Goal status: NOT MET  3.  Pt will ambulate greater than or equal to 450 feet on with LRAD and SBA for improved  cardiovascular endurance and BLE strength.  Baseline: 283 ft no AD SBA RPE 10/10 (6/6), 366 ft no AD mod I RPE 10/10 (6/30), 323 ft no AD mod I RPE 20/10 (8/6) Goal status: NOT MET  4.  Pt will demonstrate a good understanding of her diagnosis (FND/FMD) and resources available to her. Baseline: received a referral to [facility] Neurology movement disorder specialist - appt 9/24 Goal status: NOT MET   NEW SHORT TERM GOALS:   Target date: 10/13/2023   Pt will be independent with progressive HEP for improved strength, balance, transfers and gait. Baseline:  Goal status: MET  2.  Pt will improve gait velocity to at least 3.0 ft/sec for improved gait efficiency and performance at mod I level   Baseline: 2.7 ft/sec SBA no AD (6/6), 3.36 ft/sec mod I no AD (6/30), 2.56 ft/sec mod I no AD (8/6), 3.23 ft/sec mod I no AD (9/2) Goal status: MET  3.  Pt will ambulate greater than or equal to 400 feet on with LRAD and SBA for improved cardiovascular endurance and BLE strength.  Baseline: 283 ft no AD SBA RPE 10/10 (6/6), 366 ft no AD mod I RPE 10/10 (6/30), 323 ft no AD mod I RPE 20/10 (8/6), 233 ft no AD mod I (9/2) Goal status: NOT MET  4.  Pt will demonstrate good understanding of at least 3 stress relief/stress management techniques in order to better manage her anxiety and lower her blood pressure. Baseline: gardening in evening, listens to music from talk therapist, spends along time in her room on her computer; mostly gets stress when she starts speaking so feels very relaxed when by herself. Goal status: MET   NEW LONG TERM GOALS:  Target date: 11/10/2023   Pt will be independent with final HEP for improved strength, balance, transfers and gait. Baseline:  Goal status: IN PROGRESS  2.  Pt will improve gait velocity to at least 3.5 ft/sec for improved gait efficiency and performance at mod I level  Baseline: 2.7 ft/sec SBA no AD (6/6), 3.36 ft/sec mod I no AD (6/30), 2.56 ft/sec mod I no AD (8/6), 3.23 ft/sec mod I no AD (9/2) Goal status: IN PROGRESS  3.  Pt will ambulate greater than or equal to 450 feet on with LRAD and SBA for improved cardiovascular endurance and BLE strength.  Baseline: 283 ft no AD SBA RPE 10/10 (6/6), 366 ft no AD mod I RPE 10/10 (6/30), 323 ft no AD mod I RPE 20/10 (8/6), 233 ft no AD mod I (9/2) Goal status: IN PROGRESS  4.  Pt will demonstrate a good understanding of her diagnosis (FND/FMD) and resources available to her. Baseline: received a referral to [facility] Neurology movement disorder specialist - appt 9/24 Goal status: IN PROGRESS     ASSESSMENT:  CLINICAL IMPRESSION: Arrived no charge.    OBJECTIVE IMPAIRMENTS: Abnormal  gait, cardiopulmonary status limiting activity, decreased activity tolerance, decreased balance, decreased coordination, decreased endurance, decreased knowledge of condition, decreased knowledge of use of DME, decreased mobility, difficulty walking, impaired perceived functional ability, impaired sensation, improper body mechanics, and postural dysfunction.   ACTIVITY LIMITATIONS: carrying, lifting, bending, sitting, standing, squatting, sleeping, stairs, transfers, and bed mobility  PARTICIPATION LIMITATIONS: meal prep, cleaning, laundry, interpersonal relationship, driving, shopping, community activity, and occupation  PERSONAL FACTORS: Time since onset of injury/illness/exacerbation and 1-2 comorbidities: anxiety and focal dystonia are also affecting patient's functional outcome.   REHAB POTENTIAL: Good  CLINICAL DECISION MAKING: Evolving/moderate complexity  EVALUATION  COMPLEXITY: High  PLAN:  PT FREQUENCY: 2x/week  PT DURATION: 6 weeks  PLANNED INTERVENTIONS: 02835- PT Re-evaluation, 97750- Physical Performance Testing, 97110-Therapeutic exercises, 97530- Therapeutic activity, 97112- Neuromuscular re-education, 97535- Self Care, 02859- Manual therapy, [phone]- Gait training, [phone]- Aquatic Therapy, [phone]- Electrical stimulation (manual), [phone] (1-2 muscles), [phone] (3+ muscles)- Dry Needling, Patient/Family education, Balance training, Stair training, Taping, Joint mobilization, Spinal mobilization, DME instructions, Cryotherapy, and Moist heat  PLAN FOR NEXT SESSION: assess seated BP, quadruped and tall kneel to work on proximal stability, hip abd strength, work on core strengthening, functional movements, resisted gait, blaze pods, cognitive dual tasking (particularly having to speak while doing a physical task), Yoga for functional stretching- warrior pose?, did she look into [employer] for aquatics??, elliptical, can try estim again for R quads and/or R glutes? Trekking poles, RLE  SLS?resisted EVA walker   Delon DELENA Pop, PT Delon DELENA Pop, PT, DPT, CBIS   10/24/2023, 12:01 PM

## 2023-10-26 ENCOUNTER — Ambulatory Visit: Payer: Self-pay | Admitting: Physical Therapy

## 2023-10-26 NOTE — Therapy (Incomplete)
 OUTPATIENT PHYSICAL THERAPY NEURO TREATMENT   Patient Name: Lauren Levy MRN: [id number] DOB:02/02/1971, 53 y.o., female Today's Date: 10/26/2023   PCP: Vernon Velna SAUNDERS, MD REFERRING PROVIDER: Hans Cinderella Duck, MD/ recert sent to PCP  END OF SESSION:     Past Medical History:  Diagnosis Date   Ovarian cyst    Past Surgical History:  Procedure Laterality Date   BREAST BIOPSY Right 02/16/2021   papilloma   BREAST LUMPECTOMY Right 04/14/2021   BREAST LUMPECTOMY WITH RADIOACTIVE SEED LOCALIZATION Right 04/14/2021   Procedure: RIGHT BREAST LUMPECTOMY WITH RADIOACTIVE SEED LOCALIZATION;  Surgeon: Vanderbilt Ned, MD;  Location: [facility];  Service: General;  Laterality: Right;   KNEE SURGERY Right    MINOR CARPAL TUNNEL Right    Patient Active Problem List   Diagnosis Date Noted   Anxiety 01/12/2022   Foot pain, right 01/12/2022   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  Rationale for Evaluation and Treatment: Rehabilitation  SUBJECTIVE:                                                                                                                                                                                             SUBJECTIVE STATEMENT: Patient arrives to clinic alone after being sick last week. Reports still not feeling well and increased difficulty with daily mobility as she hasn't been able to do much while sick. Denies falls.   ***  Pt accompanied by: self  PERTINENT HISTORY: PMH: anxiety, focal dystonia  PAIN:  Are you having pain? No  PRECAUTIONS: None  PATIENT GOALS: see if we can help me talk without closing my eyes, strengthen my back so that I can sit like normal people (slides out of chair when  sitting normally), help with my walking  TREATMENT  Self-Care/Home Management There were no vitals filed for this visit.   ***   PATIENT EDUCATION: Education details: continue HEP, PT POC*** Education method: Explanation and Demonstration Education comprehension: verbalized understanding, returned demonstration, and needs further education  HOME EXERCISE PROGRAM: -sit at dinning table for 1 meal with upright posture picturing self in mirror  Start with trying to sit upright x 15 sec without changing position, add 15 sec as this gets easier -work on sit to stands in front of full-length mirror at home, keep eyes open Add in counting up by 1's as this gets easier  -sidelying quad stretch 3 x 30 sec each B (verbally added 6/30, pt declines handout)  Access Code: [email or web address] URL: [email or web address] Date: 08/17/2023 Prepared by: Waddell Southgate  Exercises - Supine Bridge  - 1 x daily - 7 x weekly - 3 sets - 10 reps - Supine Quadricep Sets  - 1 x daily - 7 x weekly - 3 sets - 10 reps - Seated Hip Adduction Isometrics with Ball  - 1 x daily - 7 x weekly - 3 sets - 10 reps - 5 sec hold - Sidelying ITB Stretch off Table  - 1 x daily - 7 x weekly - 3 sets - 30s hold - Supine ITB Stretch with Strap  - 1 x daily - 7 x weekly - 3 sets - 30s hold   GOALS: Goals reviewed with patient? Yes  SHORT TERM GOALS: Target date: 08/08/2023  Pt will be independent with initial HEP for improved strength, balance, transfers and gait. Baseline: Goal status: MET  2.  Pt will improve gait velocity to at least 3.0 ft/sec for improved gait efficiency and performance at SBA level  Baseline: 2.7 ft/sec SBA no AD (6/6), 3.36 ft/sec mod I no AD (6/30) Goal status: MET  3.  Pt will ambulate greater than or equal to 350 feet on with LRAD and SBA for improved  cardiovascular endurance and BLE strength.  Baseline: 283 ft no AD SBA RPE 10/10 (6/6), 366 ft no AD mod I RPE 10/10 (6/30) Goal status: MET    LONG TERM GOALS: Target date: 08/29/2023   Pt will be independent with final HEP for improved strength, balance, transfers and gait. Baseline:  Goal status: IN PROGRESS  2.  Pt will improve gait velocity to at least 3.5 ft/sec for improved gait efficiency and performance at mod I level  Baseline: 2.7 ft/sec SBA no AD (6/6), 3.36 ft/sec mod I no AD (6/30), 2.56 ft/sec mod I no AD (8/6) Goal status: NOT MET  3.  Pt will ambulate greater than or equal to 450 feet on with LRAD and SBA for improved cardiovascular endurance and BLE strength.  Baseline: 283 ft no AD SBA RPE 10/10 (6/6), 366 ft no AD mod I RPE 10/10 (6/30), 323 ft no AD mod I RPE 20/10 (8/6) Goal status: NOT MET  4.  Pt will demonstrate a good understanding of her diagnosis (FND/FMD) and resources available to her. Baseline: received a referral to [facility] Neurology movement disorder specialist - appt 9/24 Goal status: NOT MET   NEW SHORT TERM GOALS:   Target date: 10/13/2023   Pt will be independent with progressive HEP for improved strength, balance, transfers and gait. Baseline:  Goal status: MET  2.  Pt will improve gait velocity to at least 3.0 ft/sec for improved gait efficiency and performance at mod I level  Baseline: 2.7 ft/sec SBA no AD (6/6), 3.36 ft/sec mod  I no AD (6/30), 2.56 ft/sec mod I no AD (8/6), 3.23 ft/sec mod I no AD (9/2) Goal status: MET  3.  Pt will ambulate greater than or equal to 400 feet on with LRAD and SBA for improved cardiovascular endurance and BLE strength.  Baseline: 283 ft no AD SBA RPE 10/10 (6/6), 366 ft no AD mod I RPE 10/10 (6/30), 323 ft no AD mod I RPE 20/10 (8/6), 233 ft no AD mod I (9/2) Goal status: NOT MET  4.  Pt will demonstrate good understanding of at least 3 stress relief/stress management techniques in order to  better manage her anxiety and lower her blood pressure. Baseline: gardening in evening, listens to music from talk therapist, spends along time in her room on her computer; mostly gets stress when she starts speaking so feels very relaxed when by herself. Goal status: MET   NEW LONG TERM GOALS:  Target date: 11/10/2023   Pt will be independent with final HEP for improved strength, balance, transfers and gait. Baseline:  Goal status: IN PROGRESS  2.  Pt will improve gait velocity to at least 3.5 ft/sec for improved gait efficiency and performance at mod I level  Baseline: 2.7 ft/sec SBA no AD (6/6), 3.36 ft/sec mod I no AD (6/30), 2.56 ft/sec mod I no AD (8/6), 3.23 ft/sec mod I no AD (9/2) Goal status: IN PROGRESS  3.  Pt will ambulate greater than or equal to 450 feet on with LRAD and SBA for improved cardiovascular endurance and BLE strength.  Baseline: 283 ft no AD SBA RPE 10/10 (6/6), 366 ft no AD mod I RPE 10/10 (6/30), 323 ft no AD mod I RPE 20/10 (8/6), 233 ft no AD mod I (9/2) Goal status: IN PROGRESS  4.  Pt will demonstrate a good understanding of her diagnosis (FND/FMD) and resources available to her. Baseline: received a referral to [facility] Neurology movement disorder specialist - appt 9/24 Goal status: IN PROGRESS     ASSESSMENT:  CLINICAL IMPRESSION: Emphasis of skilled PT session*** Continue POC.    OBJECTIVE IMPAIRMENTS: Abnormal gait, cardiopulmonary status limiting activity, decreased activity tolerance, decreased balance, decreased coordination, decreased endurance, decreased knowledge of condition, decreased knowledge of use of DME, decreased mobility, difficulty walking, impaired perceived functional ability, impaired sensation, improper body mechanics, and postural dysfunction.   ACTIVITY LIMITATIONS: carrying, lifting, bending, sitting, standing, squatting, sleeping, stairs, transfers, and bed mobility  PARTICIPATION LIMITATIONS: meal prep, cleaning,  laundry, interpersonal relationship, driving, shopping, community activity, and occupation  PERSONAL FACTORS: Time since onset of injury/illness/exacerbation and 1-2 comorbidities: anxiety and focal dystonia are also affecting patient's functional outcome.   REHAB POTENTIAL: Good  CLINICAL DECISION MAKING: Evolving/moderate complexity  EVALUATION COMPLEXITY: High  PLAN:  PT FREQUENCY: 2x/week  PT DURATION: 6 weeks  PLANNED INTERVENTIONS: 02835- PT Re-evaluation, 97750- Physical Performance Testing, 97110-Therapeutic exercises, 97530- Therapeutic activity, 97112- Neuromuscular re-education, 97535- Self Care, 02859- Manual therapy, [phone]- Gait training, [phone]- Aquatic Therapy, [phone]- Electrical stimulation (manual), [phone] (1-2 muscles), 20561 (3+ muscles)- Dry Needling, Patient/Family education, Balance training, Stair training, Taping, Joint mobilization, Spinal mobilization, DME instructions, Cryotherapy, and Moist heat  PLAN FOR NEXT SESSION: assess seated BP, quadruped and tall kneel to work on proximal stability, hip abd strength, work on core strengthening, functional movements, resisted gait, blaze pods, cognitive dual tasking (particularly having to speak while doing a physical task), Yoga for functional stretching- warrior pose?, did she look into [employer] for aquatics??, elliptical, can try estim again for  R quads and/or R glutes? Trekking poles, RLE SLS?resisted EVA walker***   Waddell Southgate, PT Waddell Southgate, PT, DPT, CSRS    10/26/2023, 7:49 AM

## 2023-10-31 ENCOUNTER — Ambulatory Visit: Payer: Self-pay

## 2023-11-02 ENCOUNTER — Ambulatory Visit: Payer: Self-pay | Admitting: Physical Therapy

## 2023-11-02 DIAGNOSIS — F447 Conversion disorder with mixed symptom presentation: Secondary | ICD-10-CM | POA: Diagnosis not present

## 2023-11-03 DIAGNOSIS — D72819 Decreased white blood cell count, unspecified: Secondary | ICD-10-CM | POA: Diagnosis not present

## 2023-11-03 DIAGNOSIS — M6289 Other specified disorders of muscle: Secondary | ICD-10-CM | POA: Diagnosis not present

## 2023-11-03 DIAGNOSIS — G2401 Drug induced subacute dyskinesia: Secondary | ICD-10-CM | POA: Diagnosis not present

## 2023-11-03 DIAGNOSIS — G2571 Drug induced akathisia: Secondary | ICD-10-CM | POA: Diagnosis not present

## 2023-11-03 DIAGNOSIS — E7800 Pure hypercholesterolemia, unspecified: Secondary | ICD-10-CM | POA: Diagnosis not present

## 2023-11-03 DIAGNOSIS — E559 Vitamin D deficiency, unspecified: Secondary | ICD-10-CM | POA: Diagnosis not present

## 2023-11-07 ENCOUNTER — Ambulatory Visit: Payer: Self-pay

## 2023-11-07 VITALS — BP 156/112 | HR 88

## 2023-11-07 DIAGNOSIS — R2681 Unsteadiness on feet: Secondary | ICD-10-CM

## 2023-11-07 DIAGNOSIS — R258 Other abnormal involuntary movements: Secondary | ICD-10-CM | POA: Diagnosis not present

## 2023-11-07 DIAGNOSIS — R2689 Other abnormalities of gait and mobility: Secondary | ICD-10-CM

## 2023-11-07 NOTE — Therapy (Signed)
 OUTPATIENT PHYSICAL THERAPY NEURO TREATMENT/ RE-CERT/ 78uy VISIT PROGRESS NOTE   Patient Name: Lauren Levy MRN: [id number] DOB:1970/02/10, 53 y.o., female Today's Date: 11/07/2023   Physical Therapy Progress Note   Dates of Reporting Period:08/26/23-11/07/23  See Note below for Objective Data and Assessment of Progress/Goals.  Thank you for the referral of this patient. Delon DELENA Pop, PT, DPT, CBIS   PCP: Vernon Velna SAUNDERS, MD REFERRING PROVIDER: Hans Cinderella Duck, MD/ recert sent to PCP  END OF SESSION:  PT End of Session - 11/07/23 1136     Visit Number 20    Number of Visits 44   re-cert   Date for Recertification  88/71/74   re-cert   Authorization Type [employer]    Authorization Time Period VL:MN    PT Start Time 1138    PT Stop Time 1225    PT Time Calculation (min) 47 min    Equipment Utilized During Treatment Gait belt    Activity Tolerance Patient tolerated treatment well    Behavior During Therapy WFL for tasks assessed/performed           Past Medical History:  Diagnosis Date   Ovarian cyst    Past Surgical History:  Procedure Laterality Date   BREAST BIOPSY Right 02/16/2021   papilloma   BREAST LUMPECTOMY Right 04/14/2021   BREAST LUMPECTOMY WITH RADIOACTIVE SEED LOCALIZATION Right 04/14/2021   Procedure: RIGHT BREAST LUMPECTOMY WITH RADIOACTIVE SEED LOCALIZATION;  Surgeon: Vanderbilt Ned, MD;  Location: [facility];  Service: General;  Laterality: Right;   KNEE SURGERY Right    MINOR CARPAL TUNNEL Right    Patient Active Problem List   Diagnosis Date Noted   Anxiety 01/12/2022   Foot pain, right 01/12/2022   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   Dystonia 06/03/2021   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   Gait disturbance 10/09/2020    ONSET DATE: 06/29/2023  REFERRING DIAG: R26.9 (ICD-10-CM) - Unspecified abnormalities of gait and mobility  THERAPY DIAG:  Unsteadiness on feet - Plan: PT plan of care cert/re-cert  Other  abnormalities of gait and mobility - Plan: PT plan of care cert/re-cert  Other abnormal involuntary movements - Plan: PT plan of care cert/re-cert  Rationale for Evaluation and Treatment: Rehabilitation  SUBJECTIVE:                                                                                                                                                                                             SUBJECTIVE STATEMENT: Patient arrives to clinic alone. Reports feeling very tight today. Now with steppage gait pattern. Saw [facility] last week. They added pronanolol,  which she started taking on Friday. Per patient, [facility] provider wants her to continue with PT/ST and psych- offered psychiatry and patient declined.   Pt accompanied by: self  PERTINENT HISTORY: PMH: anxiety, focal dystonia  PAIN:  Are you having pain? No  PRECAUTIONS: None  PATIENT GOALS: see if we can help me talk without closing my eyes, strengthen my back so that I can sit like normal people (slides out of chair when sitting normally), help with my walking                                                                                                                               TREATMENT  Self-Care/Home Management Vitals:   11/07/23 1142 11/07/23 1148  BP: (!) 162/116 (!) 156/112  Pulse: 89 88   -PT feels comfortable proceeding given no s/s of CVA or other; MD aware and patient is medication  NMR: -sensory ball in L UE 230'  -physio ball gentle bouncing, pelvic circles, reciprocal punching  -thread the needle quadruped  Theract:  [facility] PT Assessment - 11/07/23 0001       Standardized Balance Assessment   Standardized Balance Assessment 10 meter walk test    10 Meter Walk 1.51ft/s or .69m/s            PATIENT EDUCATION: Education details: PT POC, use of sensory object  Education method: Medical illustrator Education comprehension: verbalized understanding, returned  demonstration, and needs further education  HOME EXERCISE PROGRAM: -sit at dinning table for 1 meal with upright posture picturing self in mirror  Start with trying to sit upright x 15 sec without changing position, add 15 sec as this gets easier -work on sit to stands in front of full-length mirror at home, keep eyes open Add in counting up by 1's as this gets easier  -sidelying quad stretch 3 x 30 sec each B (verbally added 6/30, pt declines handout)  Access Code: [email or web address] URL: [email or web address] Date: 08/17/2023 Prepared by: Waddell Southgate  Exercises - Supine Bridge  - 1 x daily - 7 x weekly - 3 sets - 10 reps - Supine Quadricep Sets  - 1 x daily - 7 x weekly - 3 sets - 10 reps - Seated Hip Adduction Isometrics with Ball  - 1 x daily - 7 x weekly - 3 sets - 10 reps - 5 sec hold - Sidelying ITB Stretch off Table  - 1 x daily - 7 x weekly - 3 sets - 30s hold - Supine ITB Stretch with Strap  - 1 x daily - 7 x weekly - 3 sets - 30s hold   GOALS: Goals reviewed with patient? Yes  SHORT TERM GOALS: Target date: 08/08/2023  Pt will be independent with initial HEP for improved strength, balance, transfers and gait. Baseline: Goal status: MET  2.  Pt will improve gait velocity to at least 3.0 ft/sec for improved gait efficiency and performance at SBA level  Baseline: 2.7 ft/sec SBA no AD (6/6), 3.36 ft/sec mod I no AD (6/30) Goal status: MET  3.  Pt will ambulate greater than or equal to 350 feet on with LRAD and SBA for improved cardiovascular endurance and BLE strength.  Baseline: 283 ft no AD SBA RPE 10/10 (6/6), 366 ft no AD mod I RPE 10/10 (6/30) Goal status: MET    LONG TERM GOALS: Target date: 08/29/2023   Pt will be independent with final HEP for improved strength, balance, transfers and gait. Baseline:  Goal status: IN PROGRESS  2.  Pt will improve gait velocity to at least 3.5 ft/sec for improved gait efficiency and performance at mod I level   Baseline: 2.7 ft/sec SBA no AD (6/6), 3.36 ft/sec mod I no AD (6/30), 2.56 ft/sec mod I no AD (8/6) Goal status: NOT MET  3.  Pt will ambulate greater than or equal to 450 feet on with LRAD and SBA for improved cardiovascular endurance and BLE strength.  Baseline: 283 ft no AD SBA RPE 10/10 (6/6), 366 ft no AD mod I RPE 10/10 (6/30), 323 ft no AD mod I RPE 20/10 (8/6) Goal status: NOT MET  4.  Pt will demonstrate a good understanding of her diagnosis (FND/FMD) and resources available to her. Baseline: received a referral to [facility] movement disorder specialist - appt 9/24 Goal status: NOT MET   NEW SHORT TERM GOALS:   Target date: 10/13/2023   Pt will be independent with progressive HEP for improved strength, balance, transfers and gait. Baseline:  Goal status: MET  2.  Pt will improve gait velocity to at least 3.0 ft/sec for improved gait efficiency and performance at mod I level  Baseline: 2.7 ft/sec SBA no AD (6/6), 3.36 ft/sec mod I no AD (6/30), 2.56 ft/sec mod I no AD (8/6), 3.23 ft/sec mod I no AD (9/2) Goal status: MET  3.  Pt will ambulate greater than or equal to 400 feet on with LRAD and SBA for improved cardiovascular endurance and BLE strength.  Baseline: 283 ft no AD SBA RPE 10/10 (6/6), 366 ft no AD mod I RPE 10/10 (6/30), 323 ft no AD mod I RPE 20/10 (8/6), 233 ft no AD mod I (9/2) Goal status: NOT MET  4.  Pt will demonstrate good understanding of at least 3 stress relief/stress management techniques in order to better manage her anxiety and lower her blood pressure. Baseline: gardening in evening, listens to music from talk therapist, spends along time in her room on her computer; mostly gets stress when she starts speaking so feels very relaxed when by herself. Goal status: MET   NEW LONG TERM GOALS:  Target date: 11/10/2023   Pt will be independent with final HEP for improved strength, balance, transfers and gait. Baseline:  Goal status: IN  PROGRESS  2.  Pt will improve gait velocity to at least 3.5 ft/sec for improved gait efficiency and performance at mod I level  Baseline: 2.7 ft/sec SBA no AD (6/6), 3.36 ft/sec mod I no AD (6/30), 2.56 ft/sec mod I no AD (8/6), 3.23 ft/sec mod I no AD (9/2) Goal status: IN PROGRESS  3.  Pt will ambulate greater than or equal to 450 feet on with LRAD and SBA for improved cardiovascular endurance and BLE strength.  Baseline: 283 ft no AD SBA RPE 10/10 (6/6), 366 ft no AD mod I RPE 10/10 (6/30), 323 ft no AD mod I RPE 20/10 (8/6), 233 ft  no AD mod I (9/2) Goal status: IN PROGRESS  4.  Pt will demonstrate a good understanding of her diagnosis (FND/FMD) and resources available to her. Baseline: received a referral to [facility] movement disorder specialist - appt 9/24 Goal status: IN PROGRESS   NEW SHORT TERM GOALS: 12/09/23  1. Pt will be independent with updated HEP for improved functional strength and endurance    Baseline: to be updated   Goal status: NEW   2. Patient will ambulate >/= 438ft during with LRAD ModI for improved cardiovascular endurance and BLE strength    Baseline: 2102ft no AD, ModI    Goal status: NEW   3. Pt will improve gait speed to >/= .20m/s to demonstrate improved community ambulation   Baseline: .49m/s   Goal status: NEW   NEW LONG TERM GOALS: 01/06/24  1. Patient will demonstrate a good understanding of her diagnosis (FND) and resources available to her    Baseline: benefits from further education   Goal status: NEW    2. Pt will be independent with final aquatic HEP for improved trunk control and return to community exercise Baseline: to be provided Goal status: NEW  3. Pt will improve gait speed to >/= .69m/s to demonstrate improved community ambulation  Baseline: .2m/s  Goal status: NEW  4. Patient will ambulate >/= 534ft during with LRAD and ModI to demonstrate improved cardiovascular endurance and functional LE strength    Baseline: 249ft   Goal status: NEW   ASSESSMENT:  CLINICAL IMPRESSION: Patient seen for skilled PT session with emphasis on re-cert and lumbo-pelvic dissociation. She met 1/4 LTG. Her POC continues to be interrupted by episodes of significant hypertension requiring greater medical oversight. MD is aware and is comfortable with patient being seen for PT. Neuro MD added a new medication to further assist with this as well. Patient responded exceptionally well to sensory input in L UE to allow for more appropriate muscle engagement through functional tasks. Use of physioball to allow for appropriate a dissociated pelvic movement given significant pelvic tension throughout functional mobility. Patient continues to benefit from skilled PT services to allow her to return to PLOF. Continue POC.    OBJECTIVE IMPAIRMENTS: Abnormal gait, cardiopulmonary status limiting activity, decreased activity tolerance, decreased balance, decreased coordination, decreased endurance, decreased knowledge of condition, decreased knowledge of use of DME, decreased mobility, difficulty walking, impaired perceived functional ability, impaired sensation, improper body mechanics, and postural dysfunction.   ACTIVITY LIMITATIONS: carrying, lifting, bending, sitting, standing, squatting, sleeping, stairs, transfers, and bed mobility  PARTICIPATION LIMITATIONS: meal prep, cleaning, laundry, interpersonal relationship, driving, shopping, community activity, and occupation  PERSONAL FACTORS: Time since onset of injury/illness/exacerbation and 1-2 comorbidities: anxiety and focal dystonia are also affecting patient's functional outcome.   REHAB POTENTIAL: Good  CLINICAL DECISION MAKING: Evolving/moderate complexity  EVALUATION COMPLEXITY: High  PLAN:  PT FREQUENCY: 2x/week 2x a week (re-cert)  PT DURATION: 6 weeks 8 weeks (re-cert)  PLANNED INTERVENTIONS: 02835- PT Re-evaluation, 97750- Physical Performance Testing,  97110-Therapeutic exercises, 97530- Therapeutic activity, [id number]- Neuromuscular re-education, 97535- Self Care, 02859- Manual therapy, [id number]- Gait training, [phone]- Aquatic Therapy, [phone]- Electrical stimulation (manual), [phone] (1-2 muscles), [phone] (3+ muscles)- Dry Needling, Patient/Family education, Balance training, Stair training, Taping, Joint mobilization, Spinal mobilization, DME instructions, Cryotherapy, and Moist heat  PLAN FOR NEXT SESSION: assess seated BP, quadruped and tall kneel to work on proximal stability, hip abd strength, work on core strengthening, resisted EVA walker? Lumbo-pelvic dissociation   Aquatic: needs to work  on progressing toward normalizing movement, needs to work on core control, but also lumbo-pelvic dissociation/ rotational movements   Delon DELENA Pop, PT Delon DELENA Pop, PT, DPT, CBIS   11/07/2023, 12:55 PM

## 2023-11-09 ENCOUNTER — Ambulatory Visit: Payer: Self-pay | Admitting: Physical Therapy

## 2023-11-09 NOTE — Therapy (Incomplete)
 OUTPATIENT PHYSICAL THERAPY NEURO TREATMENT   Patient Name: Lauren Levy MRN: [id number] DOB:1970/10/30, 53 y.o., female Today's Date: 11/09/2023      PCP: Vernon Velna SAUNDERS, MD REFERRING PROVIDER: Hans Cinderella Duck, MD/ recert sent to PCP  END OF SESSION:     Past Medical History:  Diagnosis Date   Ovarian cyst    Past Surgical History:  Procedure Laterality Date   BREAST BIOPSY Right 02/16/2021   papilloma   BREAST LUMPECTOMY Right 04/14/2021   BREAST LUMPECTOMY WITH RADIOACTIVE SEED LOCALIZATION Right 04/14/2021   Procedure: RIGHT BREAST LUMPECTOMY WITH RADIOACTIVE SEED LOCALIZATION;  Surgeon: Vanderbilt Ned, MD;  Location: [facility];  Service: General;  Laterality: Right;   KNEE SURGERY Right    MINOR CARPAL TUNNEL Right    Patient Active Problem List   Diagnosis Date Noted   Anxiety 01/12/2022   Foot pain, right 01/12/2022   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  Rationale for Evaluation and Treatment: Rehabilitation  SUBJECTIVE:                                                                                                                                                                                             SUBJECTIVE STATEMENT: Patient arrives to clinic alone. Reports feeling very tight today. Now with steppage gait pattern. Saw [facility] last week. They added pronanolol, which she started taking on Friday. Per patient, [facility] provider wants her to continue with PT/ST and psych- offered psychiatry and patient declined.   ***  Pt accompanied by: self  PERTINENT HISTORY: PMH: anxiety, focal dystonia  PAIN:  Are you having pain? No  PRECAUTIONS: None  PATIENT GOALS: see if we can help me  talk without closing my eyes, strengthen my back so that I can sit like normal people (slides out of chair when sitting normally), help with my walking  TREATMENT  Self-Care/Home Management There were no vitals filed for this visit.  TherAct ***  NMR ***     PATIENT EDUCATION: Education details: PT POC, use of sensory object***  Education method: Explanation and Demonstration Education comprehension: verbalized understanding, returned demonstration, and needs further education  HOME EXERCISE PROGRAM: -sit at dinning table for 1 meal with upright posture picturing self in mirror  Start with trying to sit upright x 15 sec without changing position, add 15 sec as this gets easier -work on sit to stands in front of full-length mirror at home, keep eyes open Add in counting up by 1's as this gets easier  -sidelying quad stretch 3 x 30 sec each B (verbally added 6/30, pt declines handout)  Access Code: [email or web address] URL: [email or web address] Date: 08/17/2023 Prepared by: Waddell Southgate  Exercises - Supine Bridge  - 1 x daily - 7 x weekly - 3 sets - 10 reps - Supine Quadricep Sets  - 1 x daily - 7 x weekly - 3 sets - 10 reps - Seated Hip Adduction Isometrics with Ball  - 1 x daily - 7 x weekly - 3 sets - 10 reps - 5 sec hold - Sidelying ITB Stretch off Table  - 1 x daily - 7 x weekly - 3 sets - 30s hold - Supine ITB Stretch with Strap  - 1 x daily - 7 x weekly - 3 sets - 30s hold   GOALS: Goals reviewed with patient? Yes  NEW SHORT TERM GOALS: 12/09/23  1. Pt will be independent with updated HEP for improved functional strength and endurance    Baseline: to be updated   Goal status: NEW   2. Patient will ambulate >/= 463ft during with LRAD ModI for improved cardiovascular endurance and BLE strength    Baseline: 280ft no AD, ModI     Goal status: NEW   3. Pt will improve gait speed to >/= .53m/s to demonstrate improved community ambulation   Baseline: .17m/s   Goal status: NEW   NEW LONG TERM GOALS: 01/06/24  1. Patient will demonstrate a good understanding of her diagnosis (FND) and resources available to her    Baseline: benefits from further education   Goal status: NEW    2. Pt will be independent with final aquatic HEP for improved trunk control and return to community exercise Baseline: to be provided Goal status: NEW  3. Pt will improve gait speed to >/= .58m/s to demonstrate improved community ambulation  Baseline: .87m/s  Goal status: NEW  4. Patient will ambulate >/= 545ft during with LRAD and ModI to demonstrate improved cardiovascular endurance and functional LE strength   Baseline: 252ft   Goal status: NEW   ASSESSMENT:  CLINICAL IMPRESSION: Patient seen for skilled PT session with emphasis on re-cert and lumbo-pelvic dissociation. She met 1/4 LTG. Her POC continues to be interrupted by episodes of significant hypertension requiring greater medical oversight. MD is aware and is comfortable with patient being seen for PT. Neuro MD added a new medication to further assist with this as well. Patient responded exceptionally well to sensory input in L UE to allow for more appropriate muscle engagement through functional tasks. Use of physioball to allow for appropriate a dissociated pelvic movement given significant pelvic tension throughout functional mobility. Patient continues to benefit from skilled PT services to allow her to return to PLOF. Continue POC.   Emphasis of skilled PT session*** Continue POC.    OBJECTIVE IMPAIRMENTS: Abnormal gait, cardiopulmonary  status limiting activity, decreased activity tolerance, decreased balance, decreased coordination, decreased endurance, decreased knowledge of condition, decreased knowledge of use of DME, decreased mobility, difficulty walking, impaired  perceived functional ability, impaired sensation, improper body mechanics, and postural dysfunction.   ACTIVITY LIMITATIONS: carrying, lifting, bending, sitting, standing, squatting, sleeping, stairs, transfers, and bed mobility  PARTICIPATION LIMITATIONS: meal prep, cleaning, laundry, interpersonal relationship, driving, shopping, community activity, and occupation  PERSONAL FACTORS: Time since onset of injury/illness/exacerbation and 1-2 comorbidities: anxiety and focal dystonia are also affecting patient's functional outcome.   REHAB POTENTIAL: Good  CLINICAL DECISION MAKING: Evolving/moderate complexity  EVALUATION COMPLEXITY: High  PLAN:  PT FREQUENCY: 2x/week 2x a week (re-cert)  PT DURATION: 6 weeks 8 weeks (re-cert)  PLANNED INTERVENTIONS: 02835- PT Re-evaluation, 97750- Physical Performance Testing, 97110-Therapeutic exercises, 97530- Therapeutic activity, [id number]- Neuromuscular re-education, 97535- Self Care, 02859- Manual therapy, [id number]- Gait training, [phone]- Aquatic Therapy, [phone]- Electrical stimulation (manual), [phone] (1-2 muscles), [phone] (3+ muscles)- Dry Needling, Patient/Family education, Balance training, Stair training, Taping, Joint mobilization, Spinal mobilization, DME instructions, Cryotherapy, and Moist heat  PLAN FOR NEXT SESSION: assess seated BP, quadruped and tall kneel to work on proximal stability, hip abd strength, work on core strengthening, resisted EVA walker? Lumbo-pelvic dissociation ***  Aquatic: needs to work on progressing toward normalizing movement, needs to work on core control, but also lumbo-pelvic dissociation/ rotational movements   Waddell Southgate, PT Waddell Southgate, PT, DPT, CSRS    11/09/2023, 7:48 AM

## 2023-11-15 ENCOUNTER — Encounter: Payer: Self-pay | Admitting: Rehabilitation

## 2023-11-15 ENCOUNTER — Ambulatory Visit: Payer: Self-pay | Attending: Neurology | Admitting: Rehabilitation

## 2023-11-15 DIAGNOSIS — R2689 Other abnormalities of gait and mobility: Secondary | ICD-10-CM | POA: Diagnosis present

## 2023-11-15 DIAGNOSIS — R2681 Unsteadiness on feet: Secondary | ICD-10-CM | POA: Insufficient documentation

## 2023-11-15 DIAGNOSIS — R258 Other abnormal involuntary movements: Secondary | ICD-10-CM | POA: Insufficient documentation

## 2023-11-15 NOTE — Therapy (Signed)
 OUTPATIENT PHYSICAL THERAPY NEURO TREATMENT   Patient Name: Lauren Levy MRN: [id number] DOB:09-Apr-1970, 53 y.o., female Today's Date: 11/15/2023    PCP: Vernon Velna SAUNDERS, MD REFERRING PROVIDER: Hans Cinderella Duck, MD/ recert sent to PCP  END OF SESSION:  PT End of Session - 11/15/23 0841     Visit Number 21    Number of Visits 44   re-cert   Date for Recertification  88/71/74   re-cert   Authorization Type Aetna    Authorization Time Period VL:MN    PT Start Time 1140    PT Stop Time 1225    PT Time Calculation (min) 45 min    Equipment Utilized During Treatment Gait belt   floatation devices as needed for safety   Activity Tolerance Patient tolerated treatment well    Behavior During Therapy WFL for tasks assessed/performed           Past Medical History:  Diagnosis Date   Ovarian cyst    Past Surgical History:  Procedure Laterality Date   BREAST BIOPSY Right 02/16/2021   papilloma   BREAST LUMPECTOMY Right 04/14/2021   BREAST LUMPECTOMY WITH RADIOACTIVE SEED LOCALIZATION Right 04/14/2021   Procedure: RIGHT BREAST LUMPECTOMY WITH RADIOACTIVE SEED LOCALIZATION;  Surgeon: Vanderbilt Ned, MD;  Location: [facility];  Service: General;  Laterality: Right;   KNEE SURGERY Right    MINOR CARPAL TUNNEL Right    Patient Active Problem List   Diagnosis Date Noted   Anxiety 01/12/2022   Foot pain, right 01/12/2022   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   Dystonia 06/03/2021   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  Reports no pain and is ready to get in pool.  BP not formally assessed as she had no symptoms and did not want to increase anxiety by informing her of BP prior to session.   Pt accompanied by: self  PERTINENT HISTORY: PMH: anxiety, focal dystonia  PAIN:  Are you having pain? No  PRECAUTIONS: None  PATIENT GOALS: see if we can help me talk without closing  my eyes, strengthen my back so that I can sit like normal people (slides out of chair when sitting normally), help with my walking                                                                                                                               TREATMENT   Patient seen for aquatic therapy today.  Treatment took place in water 3.6-4.8 feet deep depending upon activity.  Pt entered and exited the pool via stairs using B rails for support.  Pool temp approx 92 deg.   Warm Up:  Walking forwards x approx 18' x 4 laps, backwards walking x 4 laps, side stepping x 2 laps all with white barbell then switching side stepping with yellow bar bells into and out of water with abd/add x 2 more laps.    Forward marching with yellow barbells for support x 2 laps, adding in alt arm motions x 2 more laps with slight hand over hand assist from PT for coordination and balance.  Cues for improved forward weight shift over stance leg.  Sit<>stand from pool bench with feet on small step without UE support x 10 reps with light tactile cues for increasing forward weight shift and not extending trunk when in upright stance.  Forward step ups with opposite march with step placed in 4' deep section x 10 reps on each side with light support from wall.  Verbal and tactile cues to avoid over extension of trunk when in march position.    Ant/post pelvic tilts in squat position with back against wall.  Tactile cues for  correct technique and to reduce upper body/trunk movements.  Performed x 10 reps then performing hold in post pelvic tilt while marching x 2 sets of 4 reps.  Ant/post weight shift with feet in staggered stance holding yellow barbells for support x 5 reps then adding in arms (moving forward and back with weight shift) x 10 more reps on each side.  Pt reporting increased tightness in back leg with both exercises, therefore performed stretching:  With back against wall, assisted with hamstring stretch with noodle under lower calf with tactile and verbal cues to keep back against wall x 30 secs each leg then groin stretch to the side x 30 secs each.    Ended with forward walking using noodle this time x 4 more laps with improved movement and less steppage gait noted.     Pt requires buoyancy of water for support for reduced fall risk and for unloading/reduced stress on joints as pt able to tolerate increased standing and ambulation in water compared to that on land; viscosity of water is needed for resistance for strengthening and current of water provides perturbations for challenge for balance training        PATIENT EDUCATION: Education details: aquatic rationale  Education method: Medical illustrator Education comprehension: verbalized understanding, returned demonstration, and needs further education  HOME EXERCISE  PROGRAM: -sit at dinning table for 1 meal with upright posture picturing self in mirror  Start with trying to sit upright x 15 sec without changing position, add 15 sec as this gets easier -work on sit to stands in front of full-length mirror at home, keep eyes open Add in counting up by 1's as this gets easier  -sidelying quad stretch 3 x 30 sec each B (verbally added 6/30, pt declines handout)  Access Code: [email or web address] URL: [email or web address] Date: 08/17/2023 Prepared by: Waddell Southgate  Exercises - Supine Bridge  - 1 x daily - 7 x weekly - 3  sets - 10 reps - Supine Quadricep Sets  - 1 x daily - 7 x weekly - 3 sets - 10 reps - Seated Hip Adduction Isometrics with Ball  - 1 x daily - 7 x weekly - 3 sets - 10 reps - 5 sec hold - Sidelying ITB Stretch off Table  - 1 x daily - 7 x weekly - 3 sets - 30s hold - Supine ITB Stretch with Strap  - 1 x daily - 7 x weekly - 3 sets - 30s hold   GOALS: Goals reviewed with patient? Yes     NEW SHORT TERM GOALS: 12/09/23  1. Pt will be independent with updated HEP for improved functional strength and endurance    Baseline: to be updated   Goal status: NEW   2. Patient will ambulate >/= 458ft during with LRAD ModI for improved cardiovascular endurance and BLE strength    Baseline: 24ft no AD, ModI    Goal status: NEW   3. Pt will improve gait speed to >/= .36m/s to demonstrate improved community ambulation   Baseline: .79m/s   Goal status: NEW   NEW LONG TERM GOALS: 01/06/24  1. Patient will demonstrate a good understanding of her diagnosis (FND) and resources available to her    Baseline: benefits from further education   Goal status: NEW    2. Pt will be independent with final aquatic HEP for improved trunk control and return to community exercise Baseline: to be provided Goal status: NEW  3. Pt will improve gait speed to >/= .37m/s to demonstrate improved community ambulation  Baseline: .73m/s  Goal status: NEW  4. Patient will ambulate >/= 532ft during with LRAD and ModI to demonstrate improved cardiovascular endurance and functional LE strength   Baseline: 287ft   Goal status: NEW   ASSESSMENT:  CLINICAL IMPRESSION: Patient seen for first aquatic session at [facility].  Pt tolerated exercises well with intermittent standing rest breaks or leaning against wall.  Cues for continued breathing throughout exercises.  Pt with difficulty in forward gait with steppage pattern noted but this did improve within session.  She is able to intermittently open eyes with  conversation during session today as well (approx 50-65%)   OBJECTIVE IMPAIRMENTS: Abnormal gait, cardiopulmonary status limiting activity, decreased activity tolerance, decreased balance, decreased coordination, decreased endurance, decreased knowledge of condition, decreased knowledge of use of DME, decreased mobility, difficulty walking, impaired perceived functional ability, impaired sensation, improper body mechanics, and postural dysfunction.   ACTIVITY LIMITATIONS: carrying, lifting, bending, sitting, standing, squatting, sleeping, stairs, transfers, and bed mobility  PARTICIPATION LIMITATIONS: meal prep, cleaning, laundry, interpersonal relationship, driving, shopping, community activity, and occupation  PERSONAL FACTORS: Time since onset of injury/illness/exacerbation and 1-2 comorbidities: anxiety and focal dystonia are also affecting patient's functional outcome.   REHAB POTENTIAL: Good  CLINICAL DECISION MAKING: Evolving/moderate complexity  EVALUATION COMPLEXITY: High  PLAN:  PT FREQUENCY: 2x/week 2x a week (re-cert)  PT DURATION: 6 weeks 8 weeks (re-cert)  PLANNED INTERVENTIONS: 02835- PT Re-evaluation, 97750- Physical Performance Testing, 97110-Therapeutic exercises, 97530- Therapeutic activity, [id number]- Neuromuscular re-education, 97535- Self Care, 02859- Manual therapy, [phone]- Gait training, [phone]- Aquatic Therapy, [phone]- Electrical stimulation (manual), [phone] (1-2 muscles), [phone] (3+ muscles)- Dry Needling, Patient/Family education, Balance training, Stair training, Taping, Joint mobilization, Spinal mobilization, DME instructions, Cryotherapy, and Moist heat  PLAN FOR NEXT SESSION: assess seated BP, quadruped and tall kneel to work on proximal stability, hip abd strength, work on core strengthening, resisted EVA walker? Lumbo-pelvic dissociation   Aquatic: needs to work on progressing toward normalizing movement, needs to work on core control, but also lumbo-pelvic  dissociation/ rotational movements   Damien Fought, PT, MPT [facility] [address] [place], [place], [address] Phone: [phone]   Fax:  [phone] 11/15/23, 12:38 PM

## 2023-11-17 ENCOUNTER — Ambulatory Visit: Payer: Self-pay

## 2023-11-17 VITALS — BP 134/100 | HR 85

## 2023-11-17 DIAGNOSIS — R2689 Other abnormalities of gait and mobility: Secondary | ICD-10-CM

## 2023-11-17 DIAGNOSIS — R2681 Unsteadiness on feet: Secondary | ICD-10-CM

## 2023-11-17 DIAGNOSIS — R258 Other abnormal involuntary movements: Secondary | ICD-10-CM

## 2023-11-17 NOTE — Therapy (Signed)
 OUTPATIENT PHYSICAL THERAPY NEURO TREATMENT   Patient Name: Lauren Levy MRN: [id number] DOB:26-Feb-1970, 53 y.o., female Today's Date: 11/17/2023   PCP: Vernon Velna SAUNDERS, MD REFERRING PROVIDER: Hans Cinderella Duck, MD/ recert sent to PCP  END OF SESSION:  PT End of Session - 11/17/23 0941     Visit Number 22    Number of Visits 44    Date for Recertification  01/06/24    Authorization Type Aetna    Authorization Time Period VL:MN    PT Start Time 0940   patient late   PT Stop Time 1015    PT Time Calculation (min) 35 min    Equipment Utilized During Treatment Gait belt    Activity Tolerance Patient tolerated treatment well    Behavior During Therapy WFL for tasks assessed/performed           Past Medical History:  Diagnosis Date   Ovarian cyst    Past Surgical History:  Procedure Laterality Date   BREAST BIOPSY Right 02/16/2021   papilloma   BREAST LUMPECTOMY Right 04/14/2021   BREAST LUMPECTOMY WITH RADIOACTIVE SEED LOCALIZATION Right 04/14/2021   Procedure: RIGHT BREAST LUMPECTOMY WITH RADIOACTIVE SEED LOCALIZATION;  Surgeon: Vanderbilt Ned, MD;  Location: [facility];  Service: General;  Laterality: Right;   KNEE SURGERY Right    MINOR CARPAL TUNNEL Right    Patient Active Problem List   Diagnosis Date Noted   Anxiety 01/12/2022   Foot pain, right 01/12/2022   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   Dystonia 06/03/2021   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 Had this from her knee surgery. Reports feeling very tense and thus needing to use the knee scooter. Denies falls. Initially able to complete subjective with eyes open, until mentioning drs appt- eyes closed remainder of subjective.   Pt accompanied by: self  PERTINENT HISTORY: PMH: anxiety, focal dystonia  PAIN:  Are you having pain? No  PRECAUTIONS: None  PATIENT GOALS: see if we can help me talk without closing my eyes, strengthen my back so that I can sit like  normal people (slides out of chair when sitting normally), help with my walking                                                                                                                               TREATMENT  Self-Care/Home Management Vitals:   11/17/23 0946  BP: (!) 134/100  Pulse: 85   -PT feels comfortable proceeding given no s/s of CVA or other; MD aware and patient is medication  NMR: -scifit level 1x6 mins B UE/LE for large amplitude reciprocal coordination -lateral stepping ball toss to self against wall -fwd/backward walking ball toss to SPT  -resisted eva walker for heavy work x230'   PATIENT EDUCATION: Education details: continue HEP, use of sensory object Education method: Medical illustrator Education comprehension: verbalized understanding, returned demonstration, and needs further education  HOME EXERCISE PROGRAM: -sit at dinning table for 1 meal with upright posture picturing self in mirror  Start with trying to sit upright x 15 sec without changing position, add 15 sec as this gets easier -work on sit to stands in front of full-length mirror at home, keep eyes open Add in counting up by 1's as this gets easier  -sidelying quad stretch 3 x 30 sec each B (verbally added 6/30, pt declines handout)  Access Code: [email or web address] URL: [email or web address] Date: 08/17/2023 Prepared by: Waddell Southgate  Exercises - Supine Bridge  - 1 x daily - 7 x weekly - 3 sets - 10 reps - Supine Quadricep Sets  - 1 x daily - 7 x weekly - 3 sets - 10 reps - Seated Hip Adduction Isometrics with Ball  - 1 x daily - 7 x weekly - 3 sets - 10 reps - 5 sec hold - Sidelying ITB Stretch off Table  - 1 x daily - 7 x weekly - 3 sets - 30s hold - Supine ITB Stretch with Strap  - 1 x daily - 7 x weekly - 3 sets - 30s hold   GOALS: Goals reviewed with patient? Yes  SHORT TERM GOALS: Target date: 08/08/2023  Pt will be independent with initial HEP for improved strength, balance, transfers and gait. Baseline: Goal status: MET  2.  Pt will improve gait velocity to at least 3.0 ft/sec for improved gait efficiency and performance at SBA level  Baseline: 2.7 ft/sec SBA no AD (6/6), 3.36 ft/sec mod I no AD (6/30) Goal status: MET  3.  Pt will ambulate greater than or equal to 350 feet on with LRAD and SBA for improved cardiovascular endurance and BLE strength.  Baseline: 283 ft no AD SBA RPE 10/10 (6/6), 366 ft no AD mod I RPE 10/10 (6/30) Goal status: MET    LONG TERM GOALS: Target date: 08/29/2023   Pt will be independent with final HEP for improved strength, balance, transfers and gait. Baseline:  Goal status: IN PROGRESS  2.  Pt will improve gait velocity to at least 3.5  ft/sec for improved gait efficiency and performance at mod I level  Baseline: 2.7 ft/sec SBA no AD (6/6), 3.36 ft/sec mod I no AD (6/30), 2.56 ft/sec mod I no AD (8/6) Goal status: NOT MET  3.  Pt will ambulate greater than or equal to 450 feet on with LRAD and SBA for improved cardiovascular endurance and BLE strength.  Baseline: 283 ft no AD SBA RPE 10/10 (6/6), 366 ft no AD mod I RPE 10/10 (6/30), 323 ft no AD mod I RPE 20/10 (8/6) Goal status: NOT MET  4.  Pt will demonstrate a good understanding of her diagnosis (FND/FMD) and resources available to her. Baseline: received a referral to [facility]rology movement  disorder specialist - appt 9/24 Goal status: NOT MET   NEW SHORT TERM GOALS:   Target date: 10/13/2023   Pt will be independent with progressive HEP for improved strength, balance, transfers and gait. Baseline:  Goal status: MET  2.  Pt will improve gait velocity to at least 3.0 ft/sec for improved gait efficiency and performance at mod I level  Baseline: 2.7 ft/sec SBA no AD (6/6), 3.36 ft/sec mod I no AD (6/30), 2.56 ft/sec mod I no AD (8/6), 3.23 ft/sec mod I no AD (9/2) Goal status: MET  3.  Pt will ambulate greater than or equal to 400 feet on with LRAD and SBA for improved cardiovascular endurance and BLE strength.  Baseline: 283 ft no AD SBA RPE 10/10 (6/6), 366 ft no AD mod I RPE 10/10 (6/30), 323 ft no AD mod I RPE 20/10 (8/6), 233 ft no AD mod I (9/2) Goal status: NOT MET  4.  Pt will demonstrate good understanding of at least 3 stress relief/stress management techniques in order to better manage her anxiety and lower her blood pressure. Baseline: gardening in evening, listens to music from talk therapist, spends along time in her room on her computer; mostly gets stress when she starts speaking so feels very relaxed when by herself. Goal status: MET   NEW LONG TERM GOALS:  Target date: 11/10/2023   Pt will be independent with final HEP for improved strength, balance, transfers and gait. Baseline:  Goal status: IN PROGRESS  2.  Pt will improve gait velocity to at least 3.5 ft/sec for improved gait efficiency and performance at mod I level  Baseline: 2.7 ft/sec SBA no AD (6/6), 3.36 ft/sec mod I no AD (6/30), 2.56 ft/sec mod I no AD (8/6), 3.23 ft/sec mod I no AD (9/2) Goal status: IN PROGRESS  3.  Pt will ambulate greater than or equal to 450 feet on with LRAD and SBA for improved cardiovascular endurance and BLE strength.  Baseline: 283 ft no AD SBA RPE 10/10 (6/6), 366 ft no AD mod I RPE 10/10 (6/30), 323 ft no AD mod I RPE 20/10 (8/6), 233 ft no AD mod I  (9/2) Goal status: IN PROGRESS  4.  Pt will demonstrate a good understanding of her diagnosis (FND/FMD) and resources available to her. Baseline: received a referral to [facility]rology movement disorder specialist - appt 9/24 Goal status: IN PROGRESS   NEW SHORT TERM GOALS: 12/09/23  1. Pt will be independent with updated HEP for improved functional strength and endurance    Baseline: to be updated   Goal status: NEW   2. Patient will ambulate >/= 431ft during with LRAD ModI for improved cardiovascular endurance and BLE strength    Baseline: 269ft no AD, ModI  Goal status: NEW   3. Pt will improve gait speed to >/= .83m/s to demonstrate improved community ambulation   Baseline: .76m/s   Goal status: NEW   NEW LONG TERM GOALS: 01/06/24  1. Patient will demonstrate a good understanding of her diagnosis (FND) and resources available to her    Baseline: benefits from further education   Goal status: NEW    2. Pt will be independent with final aquatic HEP for improved trunk control and return to community exercise Baseline: to be provided Goal status: NEW  3. Pt will improve gait speed to >/= .1m/s to demonstrate improved community ambulation  Baseline: .81m/s  Goal status: NEW  4. Patient will ambulate >/= 535ft during with LRAD and ModI to demonstrate improved cardiovascular endurance and functional LE strength   Baseline: 285ft   Goal status: NEW   ASSESSMENT:  CLINICAL IMPRESSION: Patient seen for skilled PT session with emphasis on heavy work and gross proprioceptive input. Initially doing very well with EO while talking and completing motor tasks. As session progression, patient reverting to [facility] and increased facial tics. With increasing challenge in tasks, PT noting increased instances of scissoring- type gait. PT voicing concerns over use of knee scooter to patient and becoming reliant on this. Patient verbalized understanding. Continue POC.    OBJECTIVE  IMPAIRMENTS: Abnormal gait, cardiopulmonary status limiting activity, decreased activity tolerance, decreased balance, decreased coordination, decreased endurance, decreased knowledge of condition, decreased knowledge of use of DME, decreased mobility, difficulty walking, impaired perceived functional ability, impaired sensation, improper body mechanics, and postural dysfunction.   ACTIVITY LIMITATIONS: carrying, lifting, bending, sitting, standing, squatting, sleeping, stairs, transfers, and bed mobility  PARTICIPATION LIMITATIONS: meal prep, cleaning, laundry, interpersonal relationship, driving, shopping, community activity, and occupation  PERSONAL FACTORS: Time since onset of injury/illness/exacerbation and 1-2 comorbidities: anxiety and focal dystonia are also affecting patient's functional outcome.   REHAB POTENTIAL: Good  CLINICAL DECISION MAKING: Evolving/moderate complexity  EVALUATION COMPLEXITY: High  PLAN:  PT FREQUENCY: 2x/week 2x a week (re-cert)  PT DURATION: 6 weeks 8 weeks (re-cert)  PLANNED INTERVENTIONS: 02835- PT Re-evaluation, 97750- Physical Performance Testing, 97110-Therapeutic exercises, 97530- Therapeutic activity, [id number]- Neuromuscular re-education, 97535- Self Care, 02859- Manual therapy, [id number]- Gait training, [phone]- Aquatic Therapy, [phone]- Electrical stimulation (manual), [phone] (1-2 muscles), 20561 (3+ muscles)- Dry Needling, Patient/Family education, Balance training, Stair training, Taping, Joint mobilization, Spinal mobilization, DME instructions, Cryotherapy, and Moist heat  PLAN FOR NEXT SESSION: assess seated BP, quadruped and tall kneel to work on proximal stability, hip abd strength, work on core strengthening, resisted EVA walker? Lumbo-pelvic dissociation   Aquatic: needs to work on progressing toward normalizing movement, needs to work on core control, but also lumbo-pelvic dissociation/ rotational movements   Delon DELENA Pop, PT Delon DELENA Pop,  PT, DPT, CBIS   11/17/2023, 12:04 PM

## 2023-11-21 ENCOUNTER — Ambulatory Visit

## 2023-11-21 VITALS — BP 162/110 | HR 91

## 2023-11-21 DIAGNOSIS — R258 Other abnormal involuntary movements: Secondary | ICD-10-CM

## 2023-11-21 DIAGNOSIS — R2689 Other abnormalities of gait and mobility: Secondary | ICD-10-CM

## 2023-11-21 DIAGNOSIS — R2681 Unsteadiness on feet: Secondary | ICD-10-CM

## 2023-11-21 NOTE — Therapy (Signed)
 OUTPATIENT PHYSICAL THERAPY NEURO TREATMENT/ ARRIVED NO CHARGE   Patient Name: Lauren Levy MRN: [id number] DOB:08-18-1970, 53 y.o., female Today's Date: 11/21/2023   PCP: Vernon Velna SAUNDERS, MD REFERRING PROVIDER: Hans Cinderella Duck, MD/ recert sent to PCP  END OF SESSION:  PT End of Session - 11/21/23 1302     Visit Number 22    Number of Visits 44    Date for Recertification  01/06/24    Authorization Type Aetna    Authorization Time Period VL:MN    PT Start Time 1316    PT Stop Time 1329   LWS   PT Time Calculation (min) 13 min           Past Medical History:  Diagnosis Date   Ovarian cyst    Past Surgical History:  Procedure Laterality Date   BREAST BIOPSY Right 02/16/2021   papilloma   BREAST LUMPECTOMY Right 04/14/2021   BREAST LUMPECTOMY WITH RADIOACTIVE SEED LOCALIZATION Right 04/14/2021   Procedure: RIGHT BREAST LUMPECTOMY WITH RADIOACTIVE SEED LOCALIZATION;  Surgeon: Vanderbilt Ned, MD;  Location: [facility];  Service: General;  Laterality: Right;   KNEE SURGERY Right    MINOR CARPAL TUNNEL Right    Patient Active Problem List   Diagnosis Date Noted   Anxiety 01/12/2022   Foot pain, right 01/12/2022   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   Dystonia 06/03/2021   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   Gait disturbance 10/09/2020    ONSET DATE: 06/29/2023  REFERRING DIAG: R26.9 (ICD-10-CM) - Unspecified abnormalities of gait and mobility  THERAPY DIAG:  Unsteadiness on feet  Other abnormalities of gait and mobility  Other abnormal involuntary movements  Rationale for Evaluation and Treatment: Rehabilitation  SUBJECTIVE:                                                                                                                                                                                             SUBJECTIVE STATEMENT: Patient arrives to clinic stating she's not doing well, has a bad HA. She requests to not participate in  therapy, but wants her BP assessed. Denies falls.   Pt accompanied by: self  PERTINENT HISTORY: PMH: anxiety, focal dystonia  PAIN:  Are you having pain? No  PRECAUTIONS: None  PATIENT GOALS: see if we can help me talk without closing my eyes, strengthen my back so that I can sit like normal people (slides out of chair when sitting normally), help with my walking  TREATMENT  Self-Care/Home Management Vitals:   11/21/23 1337  BP: (!) 162/110  Pulse: 91  Arrived no charge    PATIENT EDUCATION: Education details: s/s of CVA, PT POC Education method: Explanation and Demonstration Education comprehension: verbalized understanding, returned demonstration, and needs further education  HOME EXERCISE PROGRAM: -sit at dinning table for 1 meal with upright posture picturing self in mirror  Start with trying to sit upright x 15 sec without changing position, add 15 sec as this gets easier -work on sit to stands in front of full-length mirror at home, keep eyes open Add in counting up by 1's as this gets easier  -sidelying quad stretch 3 x 30 sec each B (verbally added 6/30, pt declines handout)  Access Code: [email or web address] URL: [email or web address] Date: 08/17/2023 Prepared by: Waddell Southgate  Exercises - Supine Bridge  - 1 x daily - 7 x weekly - 3 sets - 10 reps - Supine Quadricep Sets  - 1 x daily - 7 x weekly - 3 sets - 10 reps - Seated Hip Adduction Isometrics with Ball  - 1 x daily - 7 x weekly - 3 sets - 10 reps - 5 sec hold - Sidelying ITB Stretch off Table  - 1 x daily - 7 x weekly - 3 sets - 30s hold - Supine ITB Stretch with Strap  - 1 x daily - 7 x weekly - 3 sets - 30s hold   GOALS: Goals reviewed with patient? Yes  SHORT TERM GOALS: Target date: 08/08/2023  Pt will be independent with initial HEP for improved strength,  balance, transfers and gait. Baseline: Goal status: MET  2.  Pt will improve gait velocity to at least 3.0 ft/sec for improved gait efficiency and performance at SBA level  Baseline: 2.7 ft/sec SBA no AD (6/6), 3.36 ft/sec mod I no AD (6/30) Goal status: MET  3.  Pt will ambulate greater than or equal to 350 feet on with LRAD and SBA for improved cardiovascular endurance and BLE strength.  Baseline: 283 ft no AD SBA RPE 10/10 (6/6), 366 ft no AD mod I RPE 10/10 (6/30) Goal status: MET    LONG TERM GOALS: Target date: 08/29/2023   Pt will be independent with final HEP for improved strength, balance, transfers and gait. Baseline:  Goal status: IN PROGRESS  2.  Pt will improve gait velocity to at least 3.5 ft/sec for improved gait efficiency and performance at mod I level  Baseline: 2.7 ft/sec SBA no AD (6/6), 3.36 ft/sec mod I no AD (6/30), 2.56 ft/sec mod I no AD (8/6) Goal status: NOT MET  3.  Pt will ambulate greater than or equal to 450 feet on with LRAD and SBA for improved cardiovascular endurance and BLE strength.  Baseline: 283 ft no AD SBA RPE 10/10 (6/6), 366 ft no AD mod I RPE 10/10 (6/30), 323 ft no AD mod I RPE 20/10 (8/6) Goal status: NOT MET  4.  Pt will demonstrate a good understanding of her diagnosis (FND/FMD) and resources available to her. Baseline: received a referral to [facility] Neurology movement disorder specialist - appt 9/24 Goal status: NOT MET   NEW SHORT TERM GOALS:   Target date: 10/13/2023   Pt will be independent with progressive HEP for improved strength, balance, transfers and gait. Baseline:  Goal status: MET  2.  Pt will improve gait velocity to at least 3.0 ft/sec for improved gait efficiency and performance at mod I level  Baseline: 2.7 ft/sec  SBA no AD (6/6), 3.36 ft/sec mod I no AD (6/30), 2.56 ft/sec mod I no AD (8/6), 3.23 ft/sec mod I no AD (9/2) Goal status: MET  3.  Pt will ambulate greater than or equal to 400 feet on  with LRAD and SBA for improved cardiovascular endurance and BLE strength.  Baseline: 283 ft no AD SBA RPE 10/10 (6/6), 366 ft no AD mod I RPE 10/10 (6/30), 323 ft no AD mod I RPE 20/10 (8/6), 233 ft no AD mod I (9/2) Goal status: NOT MET  4.  Pt will demonstrate good understanding of at least 3 stress relief/stress management techniques in order to better manage her anxiety and lower her blood pressure. Baseline: gardening in evening, listens to music from talk therapist, spends along time in her room on her computer; mostly gets stress when she starts speaking so feels very relaxed when by herself. Goal status: MET   NEW LONG TERM GOALS:  Target date: 11/10/2023   Pt will be independent with final HEP for improved strength, balance, transfers and gait. Baseline:  Goal status: IN PROGRESS  2.  Pt will improve gait velocity to at least 3.5 ft/sec for improved gait efficiency and performance at mod I level  Baseline: 2.7 ft/sec SBA no AD (6/6), 3.36 ft/sec mod I no AD (6/30), 2.56 ft/sec mod I no AD (8/6), 3.23 ft/sec mod I no AD (9/2) Goal status: IN PROGRESS  3.  Pt will ambulate greater than or equal to 450 feet on with LRAD and SBA for improved cardiovascular endurance and BLE strength.  Baseline: 283 ft no AD SBA RPE 10/10 (6/6), 366 ft no AD mod I RPE 10/10 (6/30), 323 ft no AD mod I RPE 20/10 (8/6), 233 ft no AD mod I (9/2) Goal status: IN PROGRESS  4.  Pt will demonstrate a good understanding of her diagnosis (FND/FMD) and resources available to her. Baseline: received a referral to [facility] Neurology movement disorder specialist - appt 9/24 Goal status: IN PROGRESS   NEW SHORT TERM GOALS: 12/09/23  1. Pt will be independent with updated HEP for improved functional strength and endurance    Baseline: to be updated   Goal status: NEW   2. Patient will ambulate >/= 444ft during with LRAD ModI for improved cardiovascular endurance and BLE strength    Baseline: 271ft no  AD, ModI    Goal status: NEW   3. Pt will improve gait speed to >/= .62m/s to demonstrate improved community ambulation   Baseline: .37m/s   Goal status: NEW   NEW LONG TERM GOALS: 01/06/24  1. Patient will demonstrate a good understanding of her diagnosis (FND) and resources available to her    Baseline: benefits from further education   Goal status: NEW    2. Pt will be independent with final aquatic HEP for improved trunk control and return to community exercise Baseline: to be provided Goal status: NEW  3. Pt will improve gait speed to >/= .65m/s to demonstrate improved community ambulation  Baseline: .25m/s  Goal status: NEW  4. Patient will ambulate >/= 565ft during with LRAD and ModI to demonstrate improved cardiovascular endurance and functional LE strength   Baseline: 298ft   Goal status: NEW   ASSESSMENT:  CLINICAL IMPRESSION: Arrived no charge.    OBJECTIVE IMPAIRMENTS: Abnormal gait, cardiopulmonary status limiting activity, decreased activity tolerance, decreased balance, decreased coordination, decreased endurance, decreased knowledge of condition, decreased knowledge of use of DME, decreased mobility, difficulty  walking, impaired perceived functional ability, impaired sensation, improper body mechanics, and postural dysfunction.   ACTIVITY LIMITATIONS: carrying, lifting, bending, sitting, standing, squatting, sleeping, stairs, transfers, and bed mobility  PARTICIPATION LIMITATIONS: meal prep, cleaning, laundry, interpersonal relationship, driving, shopping, community activity, and occupation  PERSONAL FACTORS: Time since onset of injury/illness/exacerbation and 1-2 comorbidities: anxiety and focal dystonia are also affecting patient's functional outcome.   REHAB POTENTIAL: Good  CLINICAL DECISION MAKING: Evolving/moderate complexity  EVALUATION COMPLEXITY: High  PLAN:  PT FREQUENCY: 2x/week 2x a week (re-cert)  PT DURATION: 6 weeks 8 weeks  (re-cert)  PLANNED INTERVENTIONS: 02835- PT Re-evaluation, 97750- Physical Performance Testing, 97110-Therapeutic exercises, 97530- Therapeutic activity, [id number]- Neuromuscular re-education, 97535- Self Care, 02859- Manual therapy, [id number]- Gait training, [phone]- Aquatic Therapy, [phone]- Electrical stimulation (manual), [phone] (1-2 muscles), [phone] (3+ muscles)- Dry Needling, Patient/Family education, Balance training, Stair training, Taping, Joint mobilization, Spinal mobilization, DME instructions, Cryotherapy, and Moist heat  PLAN FOR NEXT SESSION: assess seated BP, quadruped and tall kneel to work on proximal stability, hip abd strength, work on core strengthening, resisted EVA walker? Lumbo-pelvic dissociation   Aquatic: needs to work on progressing toward normalizing movement, needs to work on core control, but also lumbo-pelvic dissociation/ rotational movements   Delon DELENA Pop, PT Delon DELENA Pop, PT, DPT, CBIS   11/21/2023, 1:39 PM

## 2023-11-23 ENCOUNTER — Ambulatory Visit: Admitting: Physical Therapy

## 2023-11-25 DIAGNOSIS — F411 Generalized anxiety disorder: Secondary | ICD-10-CM | POA: Diagnosis not present

## 2023-11-28 ENCOUNTER — Ambulatory Visit: Admitting: Physical Therapy

## 2023-11-28 VITALS — BP 151/101 | HR 79

## 2023-11-28 DIAGNOSIS — R2681 Unsteadiness on feet: Secondary | ICD-10-CM | POA: Diagnosis not present

## 2023-11-28 DIAGNOSIS — R258 Other abnormal involuntary movements: Secondary | ICD-10-CM

## 2023-11-28 DIAGNOSIS — R2689 Other abnormalities of gait and mobility: Secondary | ICD-10-CM

## 2023-11-28 NOTE — Therapy (Signed)
 OUTPATIENT PHYSICAL THERAPY NEURO TREATMENT   Patient Name: Lauren Levy MRN: [id number] DOB:Jun 10, 1970, 53 y.o., female Today's Date: 11/28/2023   PCP: Vernon Velna SAUNDERS, MD REFERRING PROVIDER: Hans Cinderella Duck, MD/ recert sent to PCP  END OF SESSION:  PT End of Session - 11/28/23 1147     Visit Number 23    Number of Visits 44    Date for Recertification  01/06/24    Authorization Type Aetna    Authorization Time Period VL:MN    PT Start Time 1145    PT Stop Time 1230    PT Time Calculation (min) 45 min    Equipment Utilized During Treatment Gait belt    Activity Tolerance Patient tolerated treatment well    Behavior During Therapy WFL for tasks assessed/performed            Past Medical History:  Diagnosis Date   Ovarian cyst    Past Surgical History:  Procedure Laterality Date   BREAST BIOPSY Right 02/16/2021   papilloma   BREAST LUMPECTOMY Right 04/14/2021   BREAST LUMPECTOMY WITH RADIOACTIVE SEED LOCALIZATION Right 04/14/2021   Procedure: RIGHT BREAST LUMPECTOMY WITH RADIOACTIVE SEED LOCALIZATION;  Surgeon: Vanderbilt Ned, MD;  Location: [facility];  Service: General;  Laterality: Right;   KNEE SURGERY Right    MINOR CARPAL TUNNEL Right    Patient Active Problem List   Diagnosis Date Noted   Anxiety 01/12/2022   Foot pain, right 01/12/2022   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   Dystonia 06/03/2021   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   Gait disturbance 10/09/2020    ONSET DATE: 06/29/2023  REFERRING DIAG: R26.9 (ICD-10-CM) - Unspecified abnormalities of gait and mobility  THERAPY DIAG:  Unsteadiness on feet  Other abnormalities of gait and mobility  Other abnormal involuntary movements  Rationale for Evaluation and Treatment: Rehabilitation  SUBJECTIVE:                                                                                                                                                                                              SUBJECTIVE STATEMENT:  Pt reports that last week and this week she has been really struggling, she can't even open her eyes when moving. She really feels like she has gotten worse over the past couple of weeks. She finds walking so difficult now that she has been using a knee scooter. She feels like her whole body is tight and has a burning sensation in her chest that won't go away. She also will start burping at times and is unable to stop. She feels pain down her L arm if she  tilts her head to the L but has no pain at rest.  She found aquatic PT very relaxing and helpful, interested in scheduling more appointments.  She has a follow-up telehealth visit with [facility] on 11/26, is going to call them sooner about neuropsych referral. She has one more week of her current propanolol dosage before she doubles it.  Pt accompanied by: self  PERTINENT HISTORY: PMH: anxiety, focal dystonia  PAIN:  Are you having pain? No  PRECAUTIONS: None  PATIENT GOALS: see if we can help me talk without closing my eyes, strengthen my back so that I can sit like normal people (slides out of chair when sitting normally), help with my walking                                                                                                                               TREATMENT  Self-Care/Home Management Vitals:   11/28/23 1212  BP: (!) 151/101  Pulse: 79   Assessed in LUE at rest. BP elevated but improved as compared to previous sessions. Pt on medication for her BP and her medical team is aware of her ongoing hypertension.  TherAct Gait x 230 ft while carrying green spiky ball as distraction Initially has to stop every 5 steps with ataxic gait, scissoring, more tightness in her L leg this date Longest stretch is 100 ft SciFit constant resistance level 3 for 5 minutes using BUE/BLEs for neural priming for reciprocal movement, dynamic cardiovascular warmup and increased amplitude of  stepping. Forwards/backwards gait with v/c to switch directions Improved ability to walk forwards with this but becomes very fatigued   PATIENT EDUCATION: Education details: PT POC with added aquatic visits Education method: Medical illustrator Education comprehension: verbalized understanding, returned demonstration, and needs further education  HOME EXERCISE PROGRAM: -sit at dinning table for 1 meal with upright posture picturing self in mirror  Start with trying to sit upright x 15 sec without changing position, add 15 sec as this gets easier -work on sit to stands in front of full-length mirror at home, keep eyes open Add in counting up by 1's as this gets easier  -sidelying quad stretch 3 x 30 sec each B (verbally added 6/30, pt declines handout)  Access Code: [email or web address] URL: [email or web address] Date: 08/17/2023 Prepared by: Waddell Southgate  Exercises - Supine Bridge  - 1 x daily - 7 x weekly - 3 sets - 10 reps - Supine Quadricep Sets  - 1 x daily - 7 x weekly - 3 sets - 10 reps - Seated Hip Adduction Isometrics with Ball  - 1 x daily - 7 x weekly - 3 sets - 10 reps - 5 sec hold - Sidelying ITB Stretch off Table  - 1 x daily - 7 x weekly - 3 sets - 30s hold - Supine ITB Stretch with Strap  - 1 x daily - 7 x weekly - 3 sets - 30s hold  GOALS: Goals reviewed with patient? Yes   NEW SHORT TERM GOALS: 12/09/23  1. Pt will be independent with updated HEP for improved functional strength and endurance    Baseline: to be updated   Goal status: NEW   2. Patient will ambulate >/= 453ft during with LRAD ModI for improved cardiovascular endurance and BLE strength    Baseline: 24ft no AD, ModI    Goal status: NEW   3. Pt will improve gait speed to >/= .28m/s to demonstrate improved community ambulation   Baseline: .33m/s   Goal status: NEW   NEW LONG TERM GOALS: 01/06/24  1. Patient will demonstrate a good understanding of her diagnosis (FND) and  resources available to her    Baseline: benefits from further education   Goal status: NEW    2. Pt will be independent with final aquatic HEP for improved trunk control and return to community exercise Baseline: to be provided Goal status: NEW  3. Pt will improve gait speed to >/= .75m/s to demonstrate improved community ambulation  Baseline: .44m/s  Goal status: NEW  4. Patient will ambulate >/= 567ft during with LRAD and ModI to demonstrate improved cardiovascular endurance and functional LE strength   Baseline: 213ft   Goal status: NEW   ASSESSMENT:  CLINICAL IMPRESSION: Emphasis of skilled PT session on continuing to assess vitals and working on reciprocal movements and normalizing movement patterns. Pt's BP remains elevated but improved as compared to previous sessions. She does exhibit more impaired movement and increased difficulty with gait as compared to previous sessions. Discussed reaching out to her neurologist at [facility] regarding a referral to neuropsychology in order to address the psychological components of her diagnosis. Pt is open to this and will reach out to her neurologist for a referral. She continues to benefit from skilled PT services to work towards improving her function and normalizing her movement patterns as best as possible. Continue POC.    OBJECTIVE IMPAIRMENTS: Abnormal gait, cardiopulmonary status limiting activity, decreased activity tolerance, decreased balance, decreased coordination, decreased endurance, decreased knowledge of condition, decreased knowledge of use of DME, decreased mobility, difficulty walking, impaired perceived functional ability, impaired sensation, improper body mechanics, and postural dysfunction.   ACTIVITY LIMITATIONS: carrying, lifting, bending, sitting, standing, squatting, sleeping, stairs, transfers, and bed mobility  PARTICIPATION LIMITATIONS: meal prep, cleaning, laundry, interpersonal relationship, driving, shopping,  community activity, and occupation  PERSONAL FACTORS: Time since onset of injury/illness/exacerbation and 1-2 comorbidities: anxiety and focal dystonia are also affecting patient's functional outcome.   REHAB POTENTIAL: Good  CLINICAL DECISION MAKING: Evolving/moderate complexity  EVALUATION COMPLEXITY: High  PLAN:  PT FREQUENCY: 2x/week 2x a week (re-cert)  PT DURATION: 6 weeks 8 weeks (re-cert)  PLANNED INTERVENTIONS: 02835- PT Re-evaluation, 97750- Physical Performance Testing, 97110-Therapeutic exercises, 97530- Therapeutic activity, [id number]- Neuromuscular re-education, 97535- Self Care, 02859- Manual therapy, [id number]- Gait training, [phone]- Aquatic Therapy, [phone]- Electrical stimulation (manual), [phone] (1-2 muscles), [phone] (3+ muscles)- Dry Needling, Patient/Family education, Balance training, Stair training, Taping, Joint mobilization, Spinal mobilization, DME instructions, Cryotherapy, and Moist heat  PLAN FOR NEXT SESSION: assess seated BP, quadruped and tall kneel to work on proximal stability, hip abd strength, work on core strengthening, resisted EVA walker? Lumbo-pelvic dissociation, carrying a distracting object   Aquatic: needs to work on progressing toward normalizing movement, needs to work on core control, but also lumbo-pelvic dissociation/ rotational movements   Waddell Southgate, PT Waddell Southgate, PT, DPT, CSRS    11/28/2023, 12:33 PM

## 2023-11-30 ENCOUNTER — Telehealth: Payer: Self-pay | Admitting: Physical Therapy

## 2023-11-30 ENCOUNTER — Ambulatory Visit: Admitting: Physical Therapy

## 2023-11-30 NOTE — Therapy (Incomplete)
 OUTPATIENT PHYSICAL THERAPY NEURO TREATMENT   Patient Name: Lauren Levy MRN: [id number] DOB:11/12/1970, 53 y.o., female Today's Date: 11/30/2023   PCP: Vernon Velna SAUNDERS, MD REFERRING PROVIDER: Hans Cinderella Duck, MD/ recert sent to PCP  END OF SESSION:      Past Medical History:  Diagnosis Date   Ovarian cyst    Past Surgical History:  Procedure Laterality Date   BREAST BIOPSY Right 02/16/2021   papilloma   BREAST LUMPECTOMY Right 04/14/2021   BREAST LUMPECTOMY WITH RADIOACTIVE SEED LOCALIZATION Right 04/14/2021   Procedure: RIGHT BREAST LUMPECTOMY WITH RADIOACTIVE SEED LOCALIZATION;  Surgeon: Vanderbilt Ned, MD;  Location: [facility];  Service: General;  Laterality: Right;   KNEE SURGERY Right    MINOR CARPAL TUNNEL Right    Patient Active Problem List   Diagnosis Date Noted   Anxiety 01/12/2022   Foot pain, right 01/12/2022   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  Rationale for Evaluation and Treatment: Rehabilitation  SUBJECTIVE:                                                                                                                                                                                             SUBJECTIVE STATEMENT:  Pt reports that last week and this week she has been really struggling, she can't even open her eyes when moving. She really feels like she has gotten worse over the past couple of weeks. She finds walking so difficult now that she has been using a knee scooter. She feels like her whole body is tight and has a burning sensation in her chest that won't go away. She also will start burping at times and is unable to stop. She feels pain down her L arm if she tilts her head to the L but has  no pain at rest.  She found aquatic PT very relaxing and helpful, interested in scheduling more appointments.  She has a follow-up telehealth visit with [facility] on 11/26, is going to call them sooner about neuropsych referral. She has one more week of her current propanolol dosage before she doubles it.   ***  Pt accompanied by: self  PERTINENT HISTORY: PMH: anxiety, focal dystonia  PAIN:  Are you having pain? No  PRECAUTIONS: None  PATIENT GOALS: see if we can help me talk without closing my eyes, strengthen my back so that I can sit like normal people (  slides out of chair when sitting normally), help with my walking                                                                                                                               TREATMENT  Self-Care/Home Management There were no vitals filed for this visit.  Assessed in LUE at rest. BP elevated but improved as compared to previous sessions. Pt on medication for her BP and her medical team is aware of her ongoing hypertension.  TherAct Gait x 230 ft while carrying green spiky ball as distraction Initially has to stop every 5 steps with ataxic gait, scissoring, more tightness in her L leg this date Longest stretch is 100 ft SciFit constant resistance level 3 for 5 minutes using BUE/BLEs for neural priming for reciprocal movement, dynamic cardiovascular warmup and increased amplitude of stepping. Forwards/backwards gait with v/c to switch directions Improved ability to walk forwards with this but becomes very fatigued ***   PATIENT EDUCATION: Education details: PT POC with added aquatic visits*** Education method: Medical illustrator Education comprehension: verbalized understanding, returned demonstration, and needs further education  HOME EXERCISE PROGRAM: -sit at dinning table for 1 meal with upright posture picturing self in mirror  Start with trying to sit upright x 15 sec without  changing position, add 15 sec as this gets easier -work on sit to stands in front of full-length mirror at home, keep eyes open Add in counting up by 1's as this gets easier  -sidelying quad stretch 3 x 30 sec each B (verbally added 6/30, pt declines handout)  Access Code: [email or web address] URL: [email or web address] Date: 08/17/2023 Prepared by: Waddell Southgate  Exercises - Supine Bridge  - 1 x daily - 7 x weekly - 3 sets - 10 reps - Supine Quadricep Sets  - 1 x daily - 7 x weekly - 3 sets - 10 reps - Seated Hip Adduction Isometrics with Ball  - 1 x daily - 7 x weekly - 3 sets - 10 reps - 5 sec hold - Sidelying ITB Stretch off Table  - 1 x daily - 7 x weekly - 3 sets - 30s hold - Supine ITB Stretch with Strap  - 1 x daily - 7 x weekly - 3 sets - 30s hold   GOALS: Goals reviewed with patient? Yes   NEW SHORT TERM GOALS: 12/09/23  1. Pt will be independent with updated HEP for improved functional strength and endurance    Baseline: to be updated   Goal status: NEW   2. Patient will ambulate >/= 483ft during with LRAD ModI for improved cardiovascular endurance and BLE strength    Baseline: 272ft no AD, ModI    Goal status: NEW   3. Pt will improve gait speed to >/= .73m/s to demonstrate improved community ambulation   Baseline: .36m/s   Goal status: NEW   NEW LONG TERM GOALS: 01/06/24  1. Patient will demonstrate a  good understanding of her diagnosis (FND) and resources available to her    Baseline: benefits from further education   Goal status: NEW    2. Pt will be independent with final aquatic HEP for improved trunk control and return to community exercise Baseline: to be provided Goal status: NEW  3. Pt will improve gait speed to >/= .16m/s to demonstrate improved community ambulation  Baseline: .92m/s  Goal status: NEW  4. Patient will ambulate >/= 550ft during with LRAD and ModI to demonstrate improved cardiovascular endurance and functional LE strength    Baseline: 214ft   Goal status: NEW   ASSESSMENT:  CLINICAL IMPRESSION: Emphasis of skilled PT session on*** continuing to assess vitals and working on reciprocal movements and normalizing movement patterns. Pt's BP remains elevated but improved as compared to previous sessions. She does exhibit more impaired movement and increased difficulty with gait as compared to previous sessions. Discussed reaching out to her neurologist at [facility] regarding a referral to neuropsychology in order to address the psychological components of her diagnosis. Pt is open to this and will reach out to her neurologist for a referral. She continues to benefit from skilled PT services to work towards improving her function and normalizing her movement patterns as best as possible. Continue POC.    OBJECTIVE IMPAIRMENTS: Abnormal gait, cardiopulmonary status limiting activity, decreased activity tolerance, decreased balance, decreased coordination, decreased endurance, decreased knowledge of condition, decreased knowledge of use of DME, decreased mobility, difficulty walking, impaired perceived functional ability, impaired sensation, improper body mechanics, and postural dysfunction.   ACTIVITY LIMITATIONS: carrying, lifting, bending, sitting, standing, squatting, sleeping, stairs, transfers, and bed mobility  PARTICIPATION LIMITATIONS: meal prep, cleaning, laundry, interpersonal relationship, driving, shopping, community activity, and occupation  PERSONAL FACTORS: Time since onset of injury/illness/exacerbation and 1-2 comorbidities: anxiety and focal dystonia are also affecting patient's functional outcome.   REHAB POTENTIAL: Good  CLINICAL DECISION MAKING: Evolving/moderate complexity  EVALUATION COMPLEXITY: High  PLAN:  PT FREQUENCY: 2x/week 2x a week (re-cert)  PT DURATION: 6 weeks 8 weeks (re-cert)  PLANNED INTERVENTIONS: 02835- PT Re-evaluation, 97750- Physical Performance Testing, 97110-Therapeutic  exercises, 97530- Therapeutic activity, [id number]- Neuromuscular re-education, 97535- Self Care, 02859- Manual therapy, [id number]- Gait training, [phone]- Aquatic Therapy, [phone]- Electrical stimulation (manual), [phone] (1-2 muscles), [phone] (3+ muscles)- Dry Needling, Patient/Family education, Balance training, Stair training, Taping, Joint mobilization, Spinal mobilization, DME instructions, Cryotherapy, and Moist heat  PLAN FOR NEXT SESSION: assess seated BP, quadruped and tall kneel to work on proximal stability, hip abd strength, work on core strengthening, resisted EVA walker? Lumbo-pelvic dissociation, carrying a distracting object ***  Aquatic: needs to work on progressing toward normalizing movement, needs to work on core control, but also lumbo-pelvic dissociation/ rotational movements   Waddell Southgate, PT Waddell Southgate, PT, DPT, CSRS    11/30/2023, 7:44 AM

## 2023-12-05 ENCOUNTER — Ambulatory Visit

## 2023-12-05 VITALS — BP 139/100 | HR 84

## 2023-12-05 DIAGNOSIS — R258 Other abnormal involuntary movements: Secondary | ICD-10-CM

## 2023-12-05 DIAGNOSIS — R2681 Unsteadiness on feet: Secondary | ICD-10-CM

## 2023-12-05 DIAGNOSIS — R2689 Other abnormalities of gait and mobility: Secondary | ICD-10-CM

## 2023-12-05 NOTE — Therapy (Signed)
 OUTPATIENT PHYSICAL THERAPY NEURO TREATMENT   Patient Name: Lauren Levy MRN: [id number] DOB:02-03-1971, 53 y.o., female Today's Date: 12/05/2023   PCP: Vernon Velna SAUNDERS, MD REFERRING PROVIDER: Hans Cinderella Duck, MD/ recert sent to PCP  END OF SESSION:  PT End of Session - 12/05/23 1407     Visit Number 24    Number of Visits 44    Date for Recertification  01/06/24    Authorization Type [employer]    Authorization Time Period [id number]    PT Start Time 1402    PT Stop Time 1446    PT Time Calculation (min) 44 min    Equipment Utilized During Treatment Gait belt    Activity Tolerance Patient tolerated treatment well    Behavior During Therapy WFL for tasks assessed/performed             Past Medical History:  Diagnosis Date   Ovarian cyst    Past Surgical History:  Procedure Laterality Date   BREAST BIOPSY Right 02/16/2021   papilloma   BREAST LUMPECTOMY Right 04/14/2021   BREAST LUMPECTOMY WITH RADIOACTIVE SEED LOCALIZATION Right 04/14/2021   Procedure: RIGHT BREAST LUMPECTOMY WITH RADIOACTIVE SEED LOCALIZATION;  Surgeon: Vanderbilt Ned, MD;  Location: [facility];  Service: General;  Laterality: Right;   KNEE SURGERY Right    MINOR CARPAL TUNNEL Right    Patient Active Problem List   Diagnosis Date Noted   Anxiety 01/12/2022   Foot pain, right 01/12/2022   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   Dystonia 06/03/2021   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   Gait disturbance 10/09/2020    ONSET DATE: 06/29/2023  REFERRING DIAG: R26.9 (ICD-10-CM) - Unspecified abnormalities of gait and mobility  THERAPY DIAG:  Unsteadiness on feet  Other abnormalities of gait and mobility  Other abnormal involuntary movements  Rationale for Evaluation and Treatment: Rehabilitation  SUBJECTIVE:                                                                                                                                                                                              SUBJECTIVE STATEMENT: 12/05/2023: Patient enters clinic on knee scooter and pt states it helps reduce her fatigue. Patient states they missed last appointment because of sleeping in that day. Patient states they continue to have leg tightness and left hip pain. When turning head to left, still has pain down left arm. Burping is still occurring and chest pain is still present but not getting worse. Patient is going to try not eating as much spicy food to see if that helps. Patient knows to tell physician about this chest pain and  burping and knows that if it gets worse such as signs/symptoms of heart attack, to call emergency medical services.   11/28/23 Subjective: She found aquatic PT very relaxing and helpful, interested in scheduling more appointments.  She has a follow-up telehealth visit with [facility] on 11/26, is going to call them sooner about neuropsych referral. She has one more week of her current propanolol dosage before she doubles it.  Pt accompanied by: self  PERTINENT HISTORY: PMH: anxiety, focal dystonia  PAIN:  Are you having pain? No  PRECAUTIONS: None  PATIENT GOALS: see if we can help me talk without closing my eyes, strengthen my back so that I can sit like normal people (slides out of chair when sitting normally), help with my walking                                                                                                                               TREATMENT  Self-Care/Home Management Vitals:   12/05/23 1326  BP: (!) 139/100  Pulse: 84    BP: Assessed in LUE at rest. BP elevated but improved as compared to previous sessions. Pt on medication for her BP which increased dosage today and her medical team is aware of her ongoing hypertension.  TherAct: Leg press, 2 sets x 10 reps each, 60 pounds, with pink puddy during 1 set Gait x 115 ft with pink puddy play, completed in full, ataxic gait, some scissoring, SBA  NMR: Agility  ladder, down and back 1 foot in x 2 rounds, ladder for distraction, dynamic balance Agility ladder, down and back skip a block (big step) x 2 rounds Agility ladder, down and back side stepping 2 feet in x 3 rounds *Movement was smooth/less ataxic with less UE tension, left side stepping more tolerable than right side stepping. Ladder used as a distraction and target for feet placement and reciprocal patterning. UE/LE hits on Blazepods, 3 on mirror, 2 on floor, 3 rounds x 45 seconds each round *Smooth movement, less ataxic and accurate contact with blaze pods, blaze pods used as distraction and target goal *SBA used throughout and CGA used with ladder and blazepods exercises   PATIENT EDUCATION: Education details:  Education method: Psychiatrist comprehension: verbalized understanding, returned demonstration, and needs further education  HOME EXERCISE PROGRAM: -sit at dinning table for 1 meal with upright posture picturing self in mirror  Start with trying to sit upright x 15 sec without changing position, add 15 sec as this gets easier -work on sit to stands in front of full-length mirror at home, keep eyes open Add in counting up by 1's as this gets easier  -sidelying quad stretch 3 x 30 sec each B (verbally added 6/30, pt declines handout)  Access Code: [email or web address] URL: [email or web address] Date: 08/17/2023 Prepared by: Waddell Southgate  Exercises - Supine Bridge  - 1 x daily - 7 x weekly - 3 sets - 10 reps - Supine  Quadricep Sets  - 1 x daily - 7 x weekly - 3 sets - 10 reps - Seated Hip Adduction Isometrics with Ball  - 1 x daily - 7 x weekly - 3 sets - 10 reps - 5 sec hold - Sidelying ITB Stretch off Table  - 1 x daily - 7 x weekly - 3 sets - 30s hold - Supine ITB Stretch with Strap  - 1 x daily - 7 x weekly - 3 sets - 30s hold   GOALS: Goals reviewed with patient? Yes   NEW SHORT TERM GOALS: 12/09/23  1. Pt will be independent with  updated HEP for improved functional strength and endurance    Baseline: to be updated   Goal status: NEW   2. Patient will ambulate >/= 475ft during with LRAD ModI for improved cardiovascular endurance and BLE strength    Baseline: 211ft no AD, ModI    Goal status: NEW   3. Pt will improve gait speed to >/= .86m/s to demonstrate improved community ambulation   Baseline: .29m/s   Goal status: NEW   NEW LONG TERM GOALS: 01/06/24  1. Patient will demonstrate a good understanding of her diagnosis (FND) and resources available to her    Baseline: benefits from further education   Goal status: NEW    2. Pt will be independent with final aquatic HEP for improved trunk control and return to community exercise Baseline: to be provided Goal status: NEW  3. Pt will improve gait speed to >/= .70m/s to demonstrate improved community ambulation  Baseline: .65m/s  Goal status: NEW  4. Patient will ambulate >/= 572ft during with LRAD and ModI to demonstrate improved cardiovascular endurance and functional LE strength   Baseline: 275ft   Goal status: NEW   ASSESSMENT:  CLINICAL IMPRESSION: Emphasis of skilled PT session on continuing to assess vitals, improve LE strengthening, reciprocal gait patterns and reducing ataxia and guarding during gait. Pt's BP remains elevated but continues to improve as compared to previous sessions. Session was well tolerated with rest needed throughout due to fatigue. Exercises were given with distraction puddy play to encourage relaxation and resulted in improved task completion such as walking and leg pressing. Leg pressing was well tolerated with improved movement patterns and less UE guarding/tension. Agility ladder was used as a distraction and target for feet placement, allowing for reciprocal gait patterning and pt had less tension/guarding throughout. Blazepods were also used for distraction and target goal which resulted in less muscle guarding by pt and  accurate target hits. She continues to benefit from skilled PT services to work towards improving her function and normalizing her movement patterns as best as possible. Continue POC.    OBJECTIVE IMPAIRMENTS: Abnormal gait, cardiopulmonary status limiting activity, decreased activity tolerance, decreased balance, decreased coordination, decreased endurance, decreased knowledge of condition, decreased knowledge of use of DME, decreased mobility, difficulty walking, impaired perceived functional ability, impaired sensation, improper body mechanics, and postural dysfunction.   ACTIVITY LIMITATIONS: carrying, lifting, bending, sitting, standing, squatting, sleeping, stairs, transfers, and bed mobility  PARTICIPATION LIMITATIONS: meal prep, cleaning, laundry, interpersonal relationship, driving, shopping, community activity, and occupation  PERSONAL FACTORS: Time since onset of injury/illness/exacerbation and 1-2 comorbidities: anxiety and focal dystonia are also affecting patient's functional outcome.   REHAB POTENTIAL: Good  CLINICAL DECISION MAKING: Evolving/moderate complexity  EVALUATION COMPLEXITY: High  PLAN:  PT FREQUENCY: 2x/week 2x a week (re-cert)  PT DURATION: 6 weeks 8 weeks (re-cert)  PLANNED INTERVENTIONS: 02835- PT Re-evaluation,  02249- Physical Performance Testing, 97110-Therapeutic exercises, 97530- Therapeutic activity, [id number]- Neuromuscular re-education, [phone]- Self Care, [phone]- Manual therapy, [phone]- Gait training, [phone]- Aquatic Therapy, [phone]- Electrical stimulation (manual), [phone] (1-2 muscles), 20561 (3+ muscles)- Dry Needling, Patient/Family education, Balance training, Stair training, Taping, Joint mobilization, Spinal mobilization, DME instructions, Cryotherapy, and Moist heat  PLAN FOR NEXT SESSION: assess seated BP, quadruped and tall kneel to work on proximal stability, hip abd strength, work on core strengthening, resisted EVA walker? Lumbo-pelvic dissociation,  carrying a distracting object   From 12/05/23: Ladder was well tolerated in both forward and lateral movements Check about upcoming appointment - Has appointment with neurologist at [facility] to address the psychological components of her diagnosis.  Aquatic: needs to work on progressing toward normalizing movement, needs to work on core control, but also lumbo-pelvic dissociation/ rotational movements   Emmalene Sherry, Student-PT    12/05/2023, 2:14 PM

## 2023-12-07 ENCOUNTER — Ambulatory Visit: Admitting: Physical Therapy

## 2023-12-07 VITALS — BP 141/97 | HR 78

## 2023-12-07 DIAGNOSIS — R2689 Other abnormalities of gait and mobility: Secondary | ICD-10-CM

## 2023-12-07 DIAGNOSIS — R2681 Unsteadiness on feet: Secondary | ICD-10-CM

## 2023-12-07 DIAGNOSIS — R258 Other abnormal involuntary movements: Secondary | ICD-10-CM

## 2023-12-07 NOTE — Therapy (Signed)
 OUTPATIENT PHYSICAL THERAPY NEURO TREATMENT   Patient Name: Lauren Levy MRN: [id number] DOB:1970-12-20, 53 y.o., female Today's Date: 12/07/2023   PCP: Vernon Velna SAUNDERS, MD REFERRING PROVIDER: Hans Cinderella Duck, MD/ recert sent to PCP  END OF SESSION:  PT End of Session - 12/07/23 1134     Visit Number 25    Number of Visits 44    Date for Recertification  01/06/24    Authorization Type [employer]    Authorization Time Period VL:MN    PT Start Time 1130    PT Stop Time 1215    PT Time Calculation (min) 45 min    Equipment Utilized During Treatment Gait belt    Activity Tolerance Patient tolerated treatment well    Behavior During Therapy WFL for tasks assessed/performed              Past Medical History:  Diagnosis Date   Ovarian cyst    Past Surgical History:  Procedure Laterality Date   BREAST BIOPSY Right 02/16/2021   papilloma   BREAST LUMPECTOMY Right 04/14/2021   BREAST LUMPECTOMY WITH RADIOACTIVE SEED LOCALIZATION Right 04/14/2021   Procedure: RIGHT BREAST LUMPECTOMY WITH RADIOACTIVE SEED LOCALIZATION;  Surgeon: Vanderbilt Ned, MD;  Location: [facility];  Service: General;  Laterality: Right;   KNEE SURGERY Right    MINOR CARPAL TUNNEL Right    Patient Active Problem List   Diagnosis Date Noted   Anxiety 01/12/2022   Foot pain, right 01/12/2022   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   Dystonia 06/03/2021   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   Gait disturbance 10/09/2020    ONSET DATE: 06/29/2023  REFERRING DIAG: R26.9 (ICD-10-CM) - Unspecified abnormalities of gait and mobility  THERAPY DIAG:  Unsteadiness on feet  Other abnormalities of gait and mobility  Other abnormal involuntary movements  Rationale for Evaluation and Treatment: Rehabilitation  SUBJECTIVE:                                                                                                                                                                                              SUBJECTIVE STATEMENT: Pt reports that when she turns her head to the left or tilts it to the left she gets pain down her L arm, stops at her elbow. Pt reports she is tired today, was not able to relax her body to be able to get to sleep, has body tension.  Pt continues to enter appointment with her knee scooter, reports she just uses it to come to her PT appointments. She is not using it at home, tends to walk backwards at home though if her muscles  tighten up.  Encouraged her to reach out to [facility] neurology about getting a neuropsych referral and emphasized importance of this in treating her FND.  Pt accompanied by: self  PERTINENT HISTORY: PMH: anxiety, focal dystonia  PAIN:  Are you having pain? No  PRECAUTIONS: None  PATIENT GOALS: see if we can help me talk without closing my eyes, strengthen my back so that I can sit like normal people (slides out of chair when sitting normally), help with my walking                                                                                                                               TREATMENT  Self-Care/Home Management Vitals:   12/07/23 1137  BP: (!) 141/97  Pulse: 78     BP: Assessed in LUE at rest. BP elevated but improved as compared to previous sessions. Pt on medication for her BP which increased dosage today and her medical team is aware of her ongoing hypertension.   Physical Performance  [facility] PT Assessment - 12/07/23 1152       Ambulation/Gait   Gait velocity 32.8 ft over 18.69 sec = 1.75 ft/sec         : 143 ft, several standing stops due to BLE muscles tightening up  TherAct: Cat/cow x 10 reps Onset of muscle tightening in her low back region Child's pose 3 x 30 sec +L/R 3 x 30 sec each Pt does have muscle tightening/activation in her whole body with this Less tolerance for stretch to the L as compared to the R To address complaints of L upper trap pain: Palpation of region, trigger  point in L UT Seated L UT stretch 3 x 30 sec Theracane trial for LUT muscle, provided information on where to purchase online Handout for TPDN and educated her on purpose and benefits, she may be willing to try it next visit    PATIENT EDUCATION: Education details: see above, TPDN Education method: Explanation, Demonstration, and Handouts Education comprehension: verbalized understanding, returned demonstration, and needs further education  HOME EXERCISE PROGRAM: -sit at dinning table for 1 meal with upright posture picturing self in mirror  Start with trying to sit upright x 15 sec without changing position, add 15 sec as this gets easier -work on sit to stands in front of full-length mirror at home, keep eyes open Add in counting up by 1's as this gets easier  -sidelying quad stretch 3 x 30 sec each B (verbally added 6/30, pt declines handout)  Access Code: [email or web address] URL: [email or web address] Date: 08/17/2023 Prepared by: Waddell Southgate  Exercises - Supine Bridge  - 1 x daily - 7 x weekly - 3 sets - 10 reps - Supine Quadricep Sets  - 1 x daily - 7 x weekly - 3 sets - 10 reps - Seated Hip Adduction Isometrics with Ball  - 1 x daily - 7 x weekly - 3 sets - 10 reps -  5 sec hold - Sidelying ITB Stretch off Table  - 1 x daily - 7 x weekly - 3 sets - 30s hold - Supine ITB Stretch with Strap  - 1 x daily - 7 x weekly - 3 sets - 30s hold   GOALS: Goals reviewed with patient? Yes   NEW SHORT TERM GOALS: 12/09/23  1. Pt will be independent with updated HEP for improved functional strength and endurance    Baseline: to be updated   Goal status: NEW   2. Patient will ambulate >/= 438ft during with LRAD ModI for improved cardiovascular endurance and BLE strength    Baseline: 267ft no AD, ModI    Goal status: NEW   3. Pt will improve gait speed to >/= .63m/s to demonstrate improved community ambulation   Baseline: .27m/s   Goal status: NEW   NEW LONG TERM  GOALS: 01/06/24  1. Patient will demonstrate a good understanding of her diagnosis (FND) and resources available to her    Baseline: benefits from further education   Goal status: NEW    2. Pt will be independent with final aquatic HEP for improved trunk control and return to community exercise Baseline: to be provided Goal status: NEW  3. Pt will improve gait speed to >/= .65m/s to demonstrate improved community ambulation  Baseline: .28m/s  Goal status: NEW  4. Patient will ambulate >/= 588ft during with LRAD and ModI to demonstrate improved cardiovascular endurance and functional LE strength   Baseline: 228ft   Goal status: NEW   ASSESSMENT:  CLINICAL IMPRESSION: Emphasis of skilled PT session on continuing to assess vitals, attempting to assess STG, and addressing LUT pain. Pt's BP remains elevated but continues to improve as compared to previous sessions. Her STG scores are worse than last assessment but she is also having a rough day due to not sleeping well, plan to reassess these next visit. Also educated her on TPDN to address her LUT pain and she may be open to trialing this next visit. She continues to benefit from skilled PT services to work towards improving her function and normalizing her movement patterns as best as possible. Continue POC.    OBJECTIVE IMPAIRMENTS: Abnormal gait, cardiopulmonary status limiting activity, decreased activity tolerance, decreased balance, decreased coordination, decreased endurance, decreased knowledge of condition, decreased knowledge of use of DME, decreased mobility, difficulty walking, impaired perceived functional ability, impaired sensation, improper body mechanics, and postural dysfunction.   ACTIVITY LIMITATIONS: carrying, lifting, bending, sitting, standing, squatting, sleeping, stairs, transfers, and bed mobility  PARTICIPATION LIMITATIONS: meal prep, cleaning, laundry, interpersonal relationship, driving, shopping, community  activity, and occupation  PERSONAL FACTORS: Time since onset of injury/illness/exacerbation and 1-2 comorbidities: anxiety and focal dystonia are also affecting patient's functional outcome.   REHAB POTENTIAL: Good  CLINICAL DECISION MAKING: Evolving/moderate complexity  EVALUATION COMPLEXITY: High  PLAN:  PT FREQUENCY: 2x/week 2x a week (re-cert)  PT DURATION: 6 weeks 8 weeks (re-cert)  PLANNED INTERVENTIONS: 02835- PT Re-evaluation, 97750- Physical Performance Testing, 97110-Therapeutic exercises, 97530- Therapeutic activity, [id number]- Neuromuscular re-education, 97535- Self Care, 02859- Manual therapy, [id number]- Gait training, [phone]- Aquatic Therapy, [phone]- Electrical stimulation (manual), [phone] (1-2 muscles), [id number] (3+ muscles)- Dry Needling, Patient/Family education, Balance training, Stair training, Taping, Joint mobilization, Spinal mobilization, DME instructions, Cryotherapy, and Moist heat  PLAN FOR NEXT SESSION: assess seated BP, quadruped and tall kneel to work on proximal stability, hip abd strength, work on core strengthening, resisted EVA walker? Lumbo-pelvic dissociation, carrying a distracting object  Assess STG again, TPDN  Aquatic: needs to work on progressing toward normalizing movement, needs to work on core control, but also lumbo-pelvic dissociation/ rotational movements   Waddell Southgate, PT Waddell Southgate, PT, DPT, CSRS     12/07/2023, 12:16 PM

## 2023-12-08 DIAGNOSIS — F411 Generalized anxiety disorder: Secondary | ICD-10-CM | POA: Diagnosis not present

## 2023-12-12 ENCOUNTER — Ambulatory Visit: Attending: Neurology | Admitting: Physical Therapy

## 2023-12-12 VITALS — BP 140/104 | HR 85

## 2023-12-12 DIAGNOSIS — R258 Other abnormal involuntary movements: Secondary | ICD-10-CM | POA: Insufficient documentation

## 2023-12-12 DIAGNOSIS — R2681 Unsteadiness on feet: Secondary | ICD-10-CM | POA: Insufficient documentation

## 2023-12-12 DIAGNOSIS — R2689 Other abnormalities of gait and mobility: Secondary | ICD-10-CM | POA: Insufficient documentation

## 2023-12-12 DIAGNOSIS — R4789 Other speech disturbances: Secondary | ICD-10-CM | POA: Insufficient documentation

## 2023-12-12 NOTE — Therapy (Signed)
 OUTPATIENT PHYSICAL THERAPY NEURO TREATMENT   Patient Name: Lauren Levy MRN: [id number] DOB:04/18/1970, 53 y.o., female Today's Date: 12/12/2023   PCP: Vernon Velna SAUNDERS, MD REFERRING PROVIDER: Hans Cinderella Duck, MD/ recert sent to PCP  END OF SESSION:  PT End of Session - 12/12/23 1149     Visit Number 26    Number of Visits 44    Date for Recertification  01/06/24    Authorization Type Aet[employer]    Authorization Time Period VL:MN    PT Start Time 1147   pt arrived late   PT Stop Time 1228    PT Time Calculation (min) 41 min    Equipment Utilized During Treatment Gait belt    Activity Tolerance Patient tolerated treatment well    Behavior During Therapy WFL for tasks assessed/performed               Past Medical History:  Diagnosis Date   Ovarian cyst    Past Surgical History:  Procedure Laterality Date   BREAST BIOPSY Right 02/16/2021   papilloma   BREAST LUMPECTOMY Right 04/14/2021   BREAST LUMPECTOMY WITH RADIOACTIVE SEED LOCALIZATION Right 04/14/2021   Procedure: RIGHT BREAST LUMPECTOMY WITH RADIOACTIVE SEED LOCALIZATION;  Surgeon: Vanderbilt Ned, MD;  Location: [facility];  Service: General;  Laterality: Right;   KNEE SURGERY Right    MINOR CARPAL TUNNEL Right    Patient Active Problem List   Diagnosis Date Noted   Anxiety 01/12/2022   Foot pain, right 01/12/2022   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   Dystonia 06/03/2021   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  Pt continues to be tired, continues to not be able to sleep well because her muscles won't relax. Pt reports she did reach out to her doctor about her inability to sleep. Pt asking to focus on her back strength today as she believes this is what is affecting her walking.  Encouraged her to reach out to [facility] neurology about getting a neuropsych referral and emphasized importance of this in treating her FND.  Pt accompanied by: self  PERTINENT HISTORY: PMH: anxiety, focal dystonia  PAIN:  Are you having pain? No  PRECAUTIONS: None  PATIENT GOALS: see if we can help me talk without closing my eyes, strengthen my back so that I can sit like normal people (slides out of chair when sitting normally), help with my walking                                                                                                                               TREATMENT  Self-Care/Home Management Vitals:   12/12/23 1153  BP: (!) 140/104  Pulse: 85   BP: Assessed in LUE at rest. BP elevated but improved as compared to previous sessions. Pt on medication for her BP which increased dosage today and her medical team is aware of her ongoing hypertension.   TherAct Trigger Point Dry Needling  Initial Treatment: Pt instructed on Dry Needling rational, procedures, and possible side effects. Pt instructed to expect mild to moderate muscle soreness later in the day and/or into the next day.  Pt instructed in methods to reduce muscle soreness. Pt instructed to continue prescribed HEP. Because Dry Needling was performed over or adjacent to a lung field, pt was educated on S/S of pneumothorax and to seek immediate medical attention should they occur.  Patient was educated on signs and symptoms of infection and other risk factors and advised to seek medical attention should they occur.  Patient  verbalized understanding of these instructions and education.   Patient Verbal Consent Given: Yes Education Handout Provided: Previously Provided Muscles Treated: L upper trap Electrical Stimulation Performed: No Treatment Response/Outcome: deep ache/pressure   TENS level 10 to LUT for pain modulation No adverse skin effects noted after electrodes removed Pt with decreased pain symptoms following TPDN and TENS  To address core weakness: Supine dead bugs x 10 reps, easy Supine bicycles, too difficult Supine 90/90 toe taps/marches 2 x 10 reps B Pt does have difficulty laying in supine due to L hip/pelvis wanting to rotate up/forwards  Tall kneel alt L/R pelvic rotations against purple resistance band x 10 reps B   Added appropriate exercises to HEP, see bolded below    Trigger Point Dry Needling  What is Trigger Point Dry Needling (DN)? DN is a physical therapy technique used to treat muscle pain and dysfunction. Specifically, DN helps deactivate muscle trigger points (muscle knots).  A thin filiform needle is used to penetrate the skin and stimulate the underlying trigger point. The goal is for a local twitch response (LTR) to occur and for the trigger point to relax. No medication of any kind is injected during the procedure.  Benefits of Trigger Point Dry Needling Reduces localized tension and stiffness promoting muscle function. Promotes range of motion and flexibility of the area being treated. Relieves localized and referred muscle related pain.  Promotes localized blood flow. Benefits of DN increase when performed with formal therapy including strengthening  and stretching.   What Does Trigger Point Dry Needling Feel Like?  The procedure feels different for each individual patient. Some patients report that they do not actually feel the needle enter the skin and overall the process is not painful. Very mild bleeding may occur. However, many patients feel a deep cramping  in the muscle in which the needle was inserted. This is the local twitch response.   How Will I feel after the treatment? Soreness is normal, and the onset of soreness may not occur for a few hours. Typically this soreness does not last longer than two days.  Bruising is uncommon, however; ice can be used to decrease any possible bruising.  In rare cases feeling tired or nauseous after the treatment is normal. In addition, your symptoms may get worse before they get better, this period will typically not last longer than 24 hours.   What Can I do After My Treatment? Increase your hydration by drinking more water for the next 24 hours.  You may place ice or heat on the areas treated that have become sore, however, do not use heat on inflamed or bruised areas. Heat often brings more relief post needling. You can continue your regular activities, but vigorous activity is not recommended initially after the treatment for 24 hours. DN is best combined with other physical therapy such as strengthening, stretching, and other therapies.   What are the complications? While your therapist has had extensive training in minimizing the risks of trigger point dry needling, it is important to understand the risks of any procedure.  Risks include bleeding, pain, fatigue, hematoma, infection, vertigo, nausea or nerve involvement. Monitor for any changes to your skin or sensation. Contact your therapist or MD with concerns.  A rare but serious complication is a pneumothorax over or near your middle and upper chest and back If you have dry needling in this area, monitor for the following symptoms: Shortness of breath on exertion and/or Difficulty taking a deep breath and/or Chest Pain and/or A dry cough If any of the above symptoms develop, please go to the nearest emergency room or call 911. Tell them you had dry needling over your thorax and report any symptoms you are having. Please follow-up with your  treating therapist after you complete the medical evaluation.   PATIENT EDUCATION: Education details: see above, TPDN Education method: Explanation, Demonstration, and Handouts Education comprehension: verbalized understanding, returned demonstration, and needs further education  HOME EXERCISE PROGRAM: -sit at dinning table for 1 meal with upright posture picturing self in mirror  Start with trying to sit upright x 15 sec without changing position, add 15 sec as this gets easier -work on sit to stands in front of full-length mirror at home, keep eyes open Add in counting up by 1's as this gets easier  -sidelying quad stretch 3 x 30 sec each B (verbally added 6/30, pt declines handout)  Access Code: [email or web address] URL: [email or web address] Date: 08/17/2023 Prepared by: Waddell Southgate  Exercises - Supine Bridge  - 1 x daily - 7 x weekly - 3 sets - 10 reps - Supine Quadricep Sets  - 1 x daily - 7 x weekly - 3 sets - 10 reps - Seated Hip Adduction Isometrics with Ball  - 1 x daily - 7 x weekly - 3 sets - 10 reps - 5 sec hold - Sidelying ITB Stretch off Table  - 1 x daily - 7 x weekly - 3 sets - 30s hold - Supine  ITB Stretch with Strap  - 1 x daily - 7 x weekly - 3 sets - 30s hold - Supine 90/90 Alternating Toe Touch  - 1 x daily - 7 x weekly - 3 sets - 10 reps - Tall Kneeling Posterior Pelvic Tilt  - 1 x daily - 7 x weekly - 3 sets - 10 reps   GOALS: Goals reviewed with patient? Yes   NEW SHORT TERM GOALS: 12/09/23  1. Pt will be independent with updated HEP for improved functional strength and endurance    Baseline: to be updated   Goal status: NEW   2. Patient will ambulate >/= 493ft during with LRAD ModI for improved cardiovascular endurance and BLE strength    Baseline: 277ft no AD, ModI    Goal status: NEW   3. Pt will improve gait speed to >/= .18m/s to demonstrate improved community ambulation   Baseline: .67m/s   Goal status: NEW   NEW LONG TERM GOALS:  01/06/24  1. Patient will demonstrate a good understanding of her diagnosis (FND) and resources available to her    Baseline: benefits from further education   Goal status: NEW    2. Pt will be independent with final aquatic HEP for improved trunk control and return to community exercise Baseline: to be provided Goal status: NEW  3. Pt will improve gait speed to >/= .61m/s to demonstrate improved community ambulation  Baseline: .11m/s  Goal status: NEW  4. Patient will ambulate >/= 531ft during with LRAD and ModI to demonstrate improved cardiovascular endurance and functional LE strength   Baseline: 210ft   Goal status: NEW   ASSESSMENT:  CLINICAL IMPRESSION: Emphasis of skilled PT session on initiating TPDN and TENS to address ongoing pain and muscle tightness in her LUT. Pt with decreased pain symptoms following treatment this date. Remainder of session focus on core strengthening and address weakness with pelvic rotation, added new exercises to her HEP. Plan to reassess her STG during next land visit. She continues to benefit from skilled PT services to work towards improving her function and normalizing her movement patterns as best as possible. Continue POC.    OBJECTIVE IMPAIRMENTS: Abnormal gait, cardiopulmonary status limiting activity, decreased activity tolerance, decreased balance, decreased coordination, decreased endurance, decreased knowledge of condition, decreased knowledge of use of DME, decreased mobility, difficulty walking, impaired perceived functional ability, impaired sensation, improper body mechanics, and postural dysfunction.   ACTIVITY LIMITATIONS: carrying, lifting, bending, sitting, standing, squatting, sleeping, stairs, transfers, and bed mobility  PARTICIPATION LIMITATIONS: meal prep, cleaning, laundry, interpersonal relationship, driving, shopping, community activity, and occupation  PERSONAL FACTORS: Time since onset of injury/illness/exacerbation  and 1-2 comorbidities: anxiety and focal dystonia are also affecting patient's functional outcome.   REHAB POTENTIAL: Good  CLINICAL DECISION MAKING: Evolving/moderate complexity  EVALUATION COMPLEXITY: High  PLAN:  PT FREQUENCY: 2x/week 2x a week (re-cert)  PT DURATION: 6 weeks 8 weeks (re-cert)  PLANNED INTERVENTIONS: 02835- PT Re-evaluation, 97750- Physical Performance Testing, 97110-Therapeutic exercises, 97530- Therapeutic activity, [id number]- Neuromuscular re-education, 97535- Self Care, 02859- Manual therapy, [id number]- Gait training, [phone]- Aquatic Therapy, [phone]- Electrical stimulation (manual), [phone] (1-2 muscles), 20561 (3+ muscles)- Dry Needling, Patient/Family education, Balance training, Stair training, Taping, Joint mobilization, Spinal mobilization, DME instructions, Cryotherapy, and Moist heat  PLAN FOR NEXT SESSION: assess seated BP, quadruped and tall kneel to work on proximal stability, hip abd strength, work on core strengthening, resisted EVA walker? Lumbo-pelvic dissociation, carrying a distracting object, response to TPDN? Assess STG again  Aquatic: needs to work on progressing toward normalizing movement, needs to work on core control, but also lumbo-pelvic dissociation/ rotational movements   Waddell Southgate, PT Waddell Southgate, PT, DPT, CSRS     12/12/2023, 12:29 PM

## 2023-12-13 ENCOUNTER — Encounter: Payer: Self-pay | Admitting: Rehabilitation

## 2023-12-13 ENCOUNTER — Ambulatory Visit: Payer: Self-pay | Admitting: Rehabilitation

## 2023-12-13 DIAGNOSIS — R2689 Other abnormalities of gait and mobility: Secondary | ICD-10-CM

## 2023-12-13 DIAGNOSIS — R2681 Unsteadiness on feet: Secondary | ICD-10-CM

## 2023-12-13 DIAGNOSIS — R258 Other abnormal involuntary movements: Secondary | ICD-10-CM

## 2023-12-13 NOTE — Therapy (Signed)
 OUTPATIENT PHYSICAL THERAPY NEURO TREATMENT   Patient Name: Lauren Levy MRN: [id number] DOB:04-01-1970, 53 y.o., female Today's Date: 12/13/2023   PCP: Vernon Velna SAUNDERS, MD REFERRING PROVIDER: Hans Cinderella Duck, MD/ recert sent to PCP  END OF SESSION:  PT End of Session - 12/13/23 0847     Visit Number 27    Number of Visits 44    Date for Recertification  01/06/24    Authorization Type [employer]    Authorization Time Period [id number]    PT Start Time 1146    PT Stop Time 1230    PT Time Calculation (min) 44 min    Equipment Utilized During Treatment Gait belt    Activity Tolerance Patient tolerated treatment well    Behavior During Therapy WFL for tasks assessed/performed               Past Medical History:  Diagnosis Date   Ovarian cyst    Past Surgical History:  Procedure Laterality Date   BREAST BIOPSY Right 02/16/2021   papilloma   BREAST LUMPECTOMY Right 04/14/2021   BREAST LUMPECTOMY WITH RADIOACTIVE SEED LOCALIZATION Right 04/14/2021   Procedure: RIGHT BREAST LUMPECTOMY WITH RADIOACTIVE SEED LOCALIZATION;  Surgeon: Vanderbilt Ned, MD;  Location: [facility];  Service: General;  Laterality: Right;   KNEE SURGERY Right    MINOR CARPAL TUNNEL Right    Patient Active Problem List   Diagnosis Date Noted   Anxiety 01/12/2022   Foot pain, right 01/12/2022   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   Dystonia 06/03/2021   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  Pt reports feeling very good today, no pain.  Reports that she has more pool appts scheduled now as it has been one month since this PT has seen patient.    Did not formally assess BP this session as MD team/treatment team aware it is high and telling pt seems to increase anxiety.   Pt accompanied by: self  PERTINENT HISTORY: PMH: anxiety, focal dystonia  PAIN:  Are you having pain? No  PRECAUTIONS:  None  PATIENT GOALS: see if we can help me talk without closing my eyes, strengthen my back so that I can sit like normal people (slides out of chair when sitting normally), help with my walking                                                                                                                               TREATMENT   Patient seen for aquatic therapy today.  Treatment took place in water 3.6-4.8 feet deep depending upon activity.  Pt entered and exited the pool via stairs using B rails for support.  Pool temp approx 92 deg.    Warm Up:  Walking forwards x approx 18' x 4 laps, backwards walking x 4 laps, all without support then  side stepping with yellow bar bells into and out of water with abd/add x 4 more laps.     Forward marching with yellow barbells for support x 2 laps, adding in alt arm motions x 2 more laps without assist from PT today.  Standing hip flex/ext (with knee ext) x 10 reps each side first with barbells for support then with light support from wall due to UE fatigue.  Sit<>stand from pool bench with feet on small step without UE support x 10 reps with single cue for not over extended trunk in full stand.    Stretching:  Stretched hamstrings with noodle under leg (back against wall) x 30 secs each side then out to the side for groin stretch x 30 secs each side.     Pt had some LOB with turns therefore worked on making 90 deg turns x 10 reps R and x 10 reps L  making single step each side without support.  Pt did very well therefore continued with step strategy with braiding x 4 laps without UE support.    In staggered stance, performed ant/post weight shift without UE support x 5 reps, adding in arm motions for Ai [employer] Accepting then lifting opposite leg x 5 reps all of which she did great with.  Sitting on yellow noodle saddle sit maintaining balance x 1 min, added yellow barbells for support and had her bicycle LEs x 10 reps then across pool.  Requires min/mod A and cues for reciprocal motion I LEs.  Pt became unbalanced with increased reps, therefore D/Cd task.  Continued with LE strengthening with flutter kicks holding onto wall x 3 sets of 20 reps with PT providing light assist at hips to  ensure horizontal position.     Ended with forward walking without support providing cues to only flex leg enough to clear. She does well but reports this fatigues leg.  We did discuss her maybe using RW to ambulate in/out of clinic appt this week and that she could practice in her home prior to this.  Note that there are several places she can sit and rest going into clinic if needed.  Educated  on importance of trying to reduce use of knee scooter.  Pt verbalized understanding.       Pt requires buoyancy of water for support for reduced fall risk and for unloading/reduced stress on joints as pt able to tolerate increased standing and ambulation in water compared to that on land; viscosity of water is needed for resistance for strengthening and current of water provides perturbations for challenge for balance training      PATIENT EDUCATION: Education details: aquatic rationale  Education method: Programmer, Multimedia, Demonstration, and Handouts Education comprehension: verbalized understanding, returned demonstration, and needs further education  HOME EXERCISE PROGRAM: -sit at dinning table for 1 meal with upright posture picturing self in mirror  Start with trying to  sit upright x 15 sec without changing position, add 15 sec as this gets easier -work on sit to stands in front of full-length mirror at home, keep eyes open Add in counting up by 1's as this gets easier  -sidelying quad stretch 3 x 30 sec each B (verbally added 6/30, pt declines handout)  Access Code: [email or web address] URL: [email or web address] Date: 08/17/2023 Prepared by: Waddell Southgate  Exercises - Supine Bridge  - 1 x daily - 7 x weekly - 3 sets - 10 reps - Supine Quadricep Sets  - 1 x daily - 7 x weekly - 3 sets - 10 reps - Seated Hip Adduction Isometrics with Ball  - 1 x daily - 7 x weekly - 3 sets - 10 reps - 5 sec hold - Sidelying ITB Stretch off Table  - 1 x daily - 7 x weekly - 3 sets - 30s hold - Supine ITB Stretch with Strap  - 1 x daily - 7 x weekly - 3 sets - 30s hold - Supine 90/90 Alternating Toe Touch  - 1 x daily - 7 x weekly - 3 sets - 10 reps - Tall Kneeling Posterior Pelvic Tilt  - 1 x daily - 7 x weekly - 3 sets - 10 reps   GOALS: Goals reviewed with patient? Yes   NEW SHORT TERM GOALS: 12/09/23  1. Pt will be independent with updated HEP for improved functional strength and endurance    Baseline: to be updated   Goal status: NEW   2. Patient will ambulate >/= 419ft during with LRAD ModI for improved cardiovascular endurance and BLE strength    Baseline: 217ft no AD, ModI    Goal status: NEW   3. Pt will improve gait speed to >/= .71m/s to demonstrate improved community ambulation   Baseline: .77m/s   Goal status: NEW   NEW LONG TERM GOALS: 01/06/24  1. Patient will demonstrate a good understanding of her diagnosis (FND) and resources available to her    Baseline: benefits from further education   Goal status: NEW    2. Pt will be independent with final aquatic HEP for improved trunk control and return to community exercise Baseline: to be provided Goal status: NEW  3. Pt will improve gait speed to >/= .73m/s to demonstrate improved  community ambulation  Baseline: .18m/s  Goal status: NEW  4. Patient will ambulate >/= 546ft during with LRAD and ModI to demonstrate improved cardiovascular endurance and functional LE strength   Baseline: 242ft   Goal status: NEW   ASSESSMENT:  CLINICAL IMPRESSION: Emphasis of skilled PT session is normalized walking patterns, LE strengthening, core strengthening and high level balance with emphasis on SLS and modified SLS.  Pt able to tolerate quite a bit  more activity today vs previous session.  We discussed her ambulating into clinic with RW and practicing in home prior to this to increase confidence and endurance.      OBJECTIVE IMPAIRMENTS: Abnormal gait, cardiopulmonary status limiting activity, decreased activity tolerance, decreased balance, decreased coordination, decreased endurance, decreased knowledge of condition, decreased knowledge of use of DME, decreased mobility, difficulty walking, impaired perceived functional ability, impaired sensation, improper body mechanics, and postural dysfunction.   ACTIVITY LIMITATIONS: carrying, lifting, bending, sitting, standing, squatting, sleeping, stairs, transfers, and bed mobility  PARTICIPATION LIMITATIONS: meal prep, cleaning, laundry, interpersonal relationship, driving, shopping, community activity, and occupation  PERSONAL FACTORS: Time since onset of injury/illness/exacerbation and 1-2 comorbidities: anxiety and focal dystonia are also affecting patient's functional outcome.   REHAB POTENTIAL: Good  CLINICAL DECISION MAKING: Evolving/moderate complexity  EVALUATION COMPLEXITY: High  PLAN:  PT FREQUENCY: 2x/week 2x a week (re-cert)  PT DURATION: 6 weeks 8 weeks (re-cert)  PLANNED INTERVENTIONS: 02835- PT Re-evaluation, 97750- Physical Performance Testing, 97110-Therapeutic exercises, 97530- Therapeutic activity, [id number]- Neuromuscular re-education, 97535- Self Care, 02859- Manual therapy, [id number]- Gait training, [phone]-  Aquatic Therapy, [phone]- Electrical stimulation (manual), [phone] (1-2 muscles), 20561 (3+ muscles)- Dry Needling, Patient/Family education, Balance training, Stair training, Taping, Joint mobilization, Spinal mobilization, DME instructions, Cryotherapy, and Moist heat  PLAN FOR NEXT SESSION: assess seated BP, quadruped and tall kneel to work on proximal stability, hip abd strength, work on core strengthening, resisted EVA walker? Lumbo-pelvic dissociation, carrying a distracting object, response to TPDN? Assess STG again    Aquatic: needs to work on progressing toward normalizing movement, needs to work on core control, but also lumbo-pelvic dissociation/ rotational movements  Damien Fought, PT, MPT [facility] [address] [place], [place], [address] Phone: [phone]   Fax:  [phone] 12/13/23, 12:50 PM

## 2023-12-14 ENCOUNTER — Ambulatory Visit

## 2023-12-16 ENCOUNTER — Other Ambulatory Visit: Payer: Self-pay | Admitting: Obstetrics and Gynecology

## 2023-12-16 ENCOUNTER — Other Ambulatory Visit (HOSPITAL_COMMUNITY)
Admission: RE | Admit: 2023-12-16 | Discharge: 2023-12-16 | Disposition: A | Source: Ambulatory Visit | Attending: Obstetrics and Gynecology | Admitting: Obstetrics and Gynecology

## 2023-12-16 DIAGNOSIS — Z01419 Encounter for gynecological examination (general) (routine) without abnormal findings: Secondary | ICD-10-CM | POA: Insufficient documentation

## 2023-12-19 ENCOUNTER — Ambulatory Visit: Admitting: Speech Pathology

## 2023-12-19 ENCOUNTER — Ambulatory Visit

## 2023-12-19 DIAGNOSIS — R4789 Other speech disturbances: Secondary | ICD-10-CM

## 2023-12-19 NOTE — Therapy (Signed)
 OUTPATIENT SPEECH LANGUAGE PATHOLOGY TREATMENT NOTE & RECERT   Patient Name: Lauren Levy MRN: [id number] DOB:16-Aug-1970, 53 y.o., female Today's Date: 12/19/2023  PCP: Vernon Velna SAUNDERS, MD REFERRING PROVIDER: Vernon Velna SAUNDERS, MD  END OF SESSION:  End of Session - 12/19/23 1549     Visit Number 7    Number of Visits 10    Date for Recertification  01/30/24    SLP Start Time 1400    SLP Stop Time  1445    SLP Time Calculation (min) 45 min    Activity Tolerance Patient tolerated treatment well             Past Medical History:  Diagnosis Date   Ovarian cyst    Past Surgical History:  Procedure Laterality Date   BREAST BIOPSY Right 02/16/2021   papilloma   BREAST LUMPECTOMY Right 04/14/2021   BREAST LUMPECTOMY WITH RADIOACTIVE SEED LOCALIZATION Right 04/14/2021   Procedure: RIGHT BREAST LUMPECTOMY WITH RADIOACTIVE SEED LOCALIZATION;  Surgeon: Vanderbilt Ned, MD;  [facility];  Service: General;  Laterality: Right;   KNEE SURGERY Right    MINOR CARPAL TUNNEL Right    Patient Active Problem List   Diagnosis Date Noted   Anxiety 01/12/2022   Foot pain, right 01/12/2022   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 She reports that her R foot used to turn in and she would have to walk on her toes because her toes would curl under due to this but she had knee surgery to correct it. She reports that recently though her  movements have gotten worse, her whole body will tense up and she is unable to open her eyes and speak at the same time. She reports that speaking is very difficult, she finds herself holding her breath and feels like there is a weight sitting on her chest.   She also reports getting fatigued very easily, even just walking up the stairs at home to get to her bedroom/bathroom she will find herself out of breath. Additionally, when she wakes up first thing in the the morning she does not have any of these symptoms, they come on gradually throughout the day. Consult with movement disorder specialist was negative.   PAIN:  Are you having pain? No  FALLS: Has patient fallen in last 6 months? No, Number of falls: See PT eval  LIVING ENVIRONMENT: Lives with: lives with their family and lives with an adult companion Lives in: House/apartment  PLOF:Level of assistance: Independent with ADLs, Independent with IADLs Employment: Part-time employment  PATIENT GOALS: To speak with ease  OBJECTIVE:  Note: Objective measures were completed at Evaluation unless otherwise noted.  DIAGNOSTIC FINDINGS: 2023 MRI noirmal  COGNITION: Overall cognitive status: Within functional limits for tasks assessed  SOCIAL HISTORY: Occupation: realtor part time Water intake: optimal Caffeine/alcohol intake: minimal Daily voice use: moderate  PERCEPTUAL VOICE ASSESSMENT: Voice quality: harsh, strained, and vocal fatigue Vocal abuse: n/a Resonance: normal Respiratory function: thoracic breathing and clavicular breathing  OBJECTIVE VOICE ASSESSMENT: Maximum phonation time for sustained ah: 5.14 Conversational loudness average: 73 dB Conversational loudness range: 72-74 dB S/z ratio: 1.09 (Suggestive of dysfunction >1.4)  PATIENT REPORTED OUTCOME MEASURES (PROM): VRQOL 32 Rated a 5 or as bad as it can be running out of air when talking, being less outgoing due to voice. A 4 , or a lot of problem speaking  loudly, doing her profession because of her voice and avoiding going out socially because of voice                                                                                                                            TREATMENT DATE:   12/19/23: [name] noted that she has not been sleeping well, which has exacerbated her symptoms including her speech difficulties. SLP required [name] to utilize SOVTEs (straw phonation) to facilitate vocal muscle relaxation. She performed SOVTEs with 80% consistency when given frequent mod A. SLP increased challenge to syllable and word level speech production (ex. /vi, wi/). [name] verbalized at syllable and word-level with 75% consistency for overall relaxation during speech production. She continues to consistently close her eyes during any form of speech production, requiring frequent verbal max A. SLP introduced progressive relaxation during diaphragmatic breathing to decrease muscular tension during speech. [name] performed progressive relaxation appropriately in 80% of opportunities when given frequent mod A.   10/03/23: Pt reports she had a good day Saturday, but has returned to baseline. She enters with dyskinesias, eye closure and facial dyskinesias. She reports she was unable to maintain eye opening over phone calls - review strategy of eyes scanning the room while counting, saying days, months, counting by 5's. Targeted conversational sentences focusing on relaxed body, face - most beneficial to maintain fluent speech with eyes open was to use scanning in various patterns while speaking in simple repeated phrases and sentences. She maintained eyes open 7/10 sentences and in conversation for 4 turns after structured practice.   09/27/23: She was unable to practice her HEP due to spouse breaking his ankle and moving her daughter into her college dorm. In initial conversation, with verbal cues 3x over 5 minutes, she was able to correct closed eyes and continue  conversation with eyes open. Structured speech task counting by 5's to 100 forward and backward, saying months backward, she kept eyes open 85% of words with min verbal cues. Progressed to standing, coordinating eyes open, relaxed body humming, syllables, counting and song in nasal cv syllables (ma) - with occasional verbal cues, Eily completed phonation and speech tasks with eyes open in standing position. HEP see Patient Instructions  09/21/23: Zuleyka enters with WNL gait, speech and facial expression. When I pointed this out, dyskinesias and eye closure began. Tequilla states she has been having episodes of normal speech and facial posture when I don't think about it but when this is called out, her dyskinesia returns. I reinforced this improvement, reviewing strategies to allow her brain to control  her body and speech. Targeted walking and talking with tossing binder clip hand to hand - she was successful for 30 -60 second increments,again reinforcing successful moments as progress. As she is having success at home speaking to family, this week she will make a phone call to a trusted person, focusing on keeping eyes open by scanning the room throughout the call - short call 3-5 minutes twice. She is to practice humming and prolonged syllables while walking to promote coordination of gait, phonation and some articulation daily at home for 3-5 minutes.   8/6/25BETHA Siren reports she has a letter allowing her to do PT with elevated blood pressure - she does some mindfulness but this is not helping with symptoms. Utilized cutting out aphasia ID cards which did suppress her dystonia and dyskinesia while she was cutting - when she started talking and stopped cutting, mild dyskinesia returned. Targeted sustaining eyes open with hum/sustained phonation for average of 10 seconds over 4 trials, progressed to sustained vowels /I/, /u/for average of 6 seconds with eyes open 3 trials for each vowel. Progressed to cv  syllables ma, may, me , my sustaining both phonemes with eyes open 5/5 trials each. Combined nasal syllables to combine 3  - repeating 3 syllalbes 3x with eyes open and occasional min verbal cues. Pt then initiated trial of taking 4 turns in conversation with eyes open for short 5-7 word utterances with occasional min A. Of note, dyskinesias reduced when focus on eyes open during non speech and speech tasks. HEP provided of tasks completed today.   08/24/23: Completed PROM - see above. Leighann reports success with SOVTE. Completed parts of SOVTE with instruction to watch the clock to time 10 seconds in duration to train eyes open with phonation. Cues to complete accents, 1 per second watching the clock for eyes open. Initiated flow phonation stretch syllables watching amplitude of tissue displacement with airflow during phonation with syllables who, shoe, sue, phrases - she required occasional cues to maintain eyes open for visual feed back of tissue. Flow phrases focusing on  gently exaggerating flow phonemes to reduce tense pressed voice with rare min A and visual cues of flow sounds highlighted. After exercises, Darrien conversed with eyes open 70% of utterances discussing her family and talking while showing me texts on her phone with success. Add stretch flow phonation with tissue to HEP.   08/17/23: Evaluation completed - Initiated HEP for muscle tension dysphonia - Trained in gargle 2 minutes prior to HEP. She completed Semi-occluded Vocal Tract Exercises (SOVTE) in water with straw - 10 second hum completed 7x with occasional min verbal cues and modeling. Pitch glides and accents completed with verbal cues and modeling. Song with usual mod A - Instructed her to watch clock and time 10 seconds with hum to successfully facilitate eyes open with phonation. Education re: tension in throat results in pressed voice.     PATIENT EDUCATION: Education details: HEP, pressed v s flow voice, mindfulness Person  educated: Patient Education method: Explanation, Demonstration, and Verbal cues Education comprehension: verbalized understanding, returned demonstration, verbal cues required, and needs further education  HOME EXERCISE PROGRAM: Gargle, SOVTE in water  GOALS: Goals reviewed with patient? Yes  SHORT TERM GOALS: Target date: 09/20/13  Pt will complete HEP with rare min A Baseline: Goal status: NOT MET  2.  Pt will achieve clear phonation in structured speech exercises (resonant and flow) Baseline:  Goal status: NOT MET  3.  Pt will speak with eyes open  in structured speech tasks Baseline:  Goal status: NOT MET  4.  Pt will ID 2 triggers for tension dysphonia Baseline:  Goal status: NOT MET  5.  Pt will maintain clear phonation for 3 short turns in conversation with occasional min A Baseline:  Goal status: NOT MET    LONG TERM GOALS: Target date: 10/26/23  Pt will complete HEP for speech/voice with mod I Baseline:  Goal status: ONGOING  2.  Pt will maintain clear phonation and eyes open over 3 minute simple converseation Baseline:  Goal status: UPDATED  3.  Pt will reduce  extraneous oral, neck and body movements during 5 minute conversation by 50% subjectively Baseline:  Goal status: UPDATED  4.  Pt will improve score on PROM Baseline:  Goal status: ONGOING   ASSESSMENT:  CLINICAL IMPRESSION: Patient is a 53 y.o. 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 She presents with excess tensio and extraneous movements of body while speaking. She reports she is unable to keep her eyes open when she talks. She is able to speak over the phone better because she keeps her eyes closed and doesn't feel pressure to use eye contact. 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 Dyskinesias of face and body noted throughout sessions. She was able to keep eyes open with phonation in HEP training. Speech dysfluent apporximately 25% of  utterances with aytpical stutter and short 1-2 second blocks. With ongoing cueing, SOVTE (semi-occluded vocal tract exercises) she is able to achieve clear phonation at word level.  I recommend skilled ST to maximize voice quality, intelligibility for safety, success at work and return to [facility]. SABRARecert 12/19/23  OBJECTIVE IMPAIRMENTS: include voice disorder and speech disorder. These impairments are limiting patient from return to work and effectively communicating at home and in community. Factors affecting potential to achieve goals and functional outcome are co-morbidities.. Patient will benefit from skilled SLP services to address above impairments and improve overall function.  REHAB POTENTIAL: Good  PLAN:  SLP FREQUENCY: 1-2x/week  SLP DURATION: 10 weeks  PLANNED INTERVENTIONS: Environmental controls, Cueing hierachy, Cognitive reorganization, Internal/external aids, Functional tasks, Multimodal communication approach, SLP instruction and feedback, Compensatory strategies, Patient/family education, [phone] Treatment of speech (30 or 45 min) , and 07477- Speech Eval Sound Prod, Articulate, Phonological    Schon Zeiders, Leita Caldron, CCC-SLP 12/19/2023, 3:51 PM

## 2023-12-19 NOTE — Patient Instructions (Signed)
   Continue progressive relaxation  2x a day  Straw hums 5-10x - relaxed, no tension or pushing  VVVVV - 5-10x no tension or strain  SLEEP

## 2023-12-20 ENCOUNTER — Encounter: Payer: Self-pay | Admitting: Rehabilitation

## 2023-12-20 ENCOUNTER — Ambulatory Visit: Payer: Self-pay | Admitting: Rehabilitation

## 2023-12-20 DIAGNOSIS — R2689 Other abnormalities of gait and mobility: Secondary | ICD-10-CM

## 2023-12-20 DIAGNOSIS — R2681 Unsteadiness on feet: Secondary | ICD-10-CM

## 2023-12-20 LAB — CYTOLOGY - PAP
Comment: NEGATIVE
Diagnosis: NEGATIVE
High risk HPV: NEGATIVE

## 2023-12-20 NOTE — Therapy (Signed)
 OUTPATIENT PHYSICAL THERAPY NEURO TREATMENT   Patient Name: Lauren Levy MRN: [id number] DOB:[birth year], 53 y.o., female Today's Date: 12/20/2023   PCP: Vernon Velna SAUNDERS, MD REFERRING PROVIDER: Hans Cinderella Duck, MD/ recert sent to PCP  END OF SESSION:  PT End of Session - 12/20/23 0835     Visit Number 28    Number of Visits 44    Date for Recertification  01/06/24    Authorization Type [employer]    Authorization Time Period VL:MN    PT Start Time 1155   pt late to session   PT Stop Time 1230    PT Time Calculation (min) 35 min    Equipment Utilized During Treatment Gait belt;Other (comment)   floatation devices as needed   Activity Tolerance Patient tolerated treatment well    Behavior During Therapy [facility] for tasks assessed/performed               Past Medical History:  Diagnosis Date   Ovarian cyst    Past Surgical History:  Procedure Laterality Date   BREAST BIOPSY Right 02/16/2021   papilloma   BREAST LUMPECTOMY Right 04/14/2021   BREAST LUMPECTOMY WITH RADIOACTIVE SEED LOCALIZATION Right 04/14/2021   Procedure: RIGHT BREAST LUMPECTOMY WITH RADIOACTIVE SEED LOCALIZATION;  Surgeon: Vanderbilt Ned, MD;  Location: [facility];  Service: General;  Laterality: Right;   KNEE SURGERY Right    MINOR CARPAL TUNNEL Right    Patient Active Problem List   Diagnosis Date Noted   Anxiety 01/12/2022   Foot pain, right 01/12/2022   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   Dystonia 06/03/2021   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  Pt reports feeling very good today, no pain.    Did not formally assess BP this session as MD team/treatment team aware it is high and telling pt seems to increase anxiety.   Pt accompanied by: self  PERTINENT HISTORY: PMH: anxiety, focal dystonia  PAIN:  Are you having pain? No  PRECAUTIONS: None  PATIENT GOALS: see if we can help me talk without closing my eyes, strengthen  my back so that I can sit like normal people (slides out of chair when sitting normally), help with my walking                                                                                                                               TREATMENT   Patient seen for aquatic therapy today.  Treatment took place in water 3.6-4.8 feet deep depending upon activity.  Pt entered and exited the pool via stairs using B rails for support.  Pool temp approx 92 deg.    Warm Up:  Walking forwards x approx 18' x 4 laps, backwards walking x 4 laps, all without support then  side stepping with yellow bar bells into and out of water with abd/add x 4 more laps.     Forward marching with yellow barbells for support x 2 laps, adding in alt arm motions x 2 more laps without assist from PT today.  Standing hip circles x 10 reps clockwise and 10 reps CCW with intermittent light support.  Forward step ups with small step placed in 4' water with light assist from wall (opposite extremity march) x 10 reps on each side with min cues for increasing forward weight shift.  Lateral step ups without support x 10 reps.  Alt jump steps with intermittent support for improved coordination x 10 reps to jumping jacks (without arms) x 10 reps.  Pt cued to increase speed and she did very well with this.  Resisted side stepping for hip strengthening.  Cues for making larger step to opposite foot throughout.    Flutter kicks with  support on large white noodle x 4 reps with cues for keep core engaged (less uncontrolled trunk movements) and knees mostly extended.       Ended with forward walking x 2 laps without support providing cues to only flex leg enough to clear. She does well but reports this fatigues leg.      Pt requires buoyancy of water for support for reduced fall risk and for unloading/reduced stress on joints as pt able to tolerate increased standing and ambulation in water compared to that on land; viscosity of water is needed for resistance for strengthening and current of water provides perturbations for challenge for balance training      PATIENT EDUCATION: Education details: aquatic rationale  Education method: Programmer, Multimedia, Demonstration, and Handouts Education comprehension: verbalized understanding, returned demonstration, and needs further education  HOME EXERCISE PROGRAM: -sit at dinning table for 1 meal with upright posture picturing self in mirror  Start with trying to sit upright x 15 sec without changing position, add 15 sec as this gets easier -work on sit to stands in front of full-length mirror at home, keep eyes open Add in counting up by 1's as this gets easier  -sidelying quad stretch 3 x 30 sec each B (verbally added 6/30, pt declines handout)  Access Code: [email or web address] URL: [email or web address] Date: 08/17/2023  Prepared by: Waddell Southgate  Exercises - Supine Bridge  - 1 x daily - 7 x weekly - 3 sets - 10 reps - Supine Quadricep Sets  - 1 x daily - 7 x weekly - 3 sets - 10 reps - Seated Hip Adduction Isometrics with Ball  - 1 x daily - 7 x weekly - 3 sets - 10 reps - 5 sec hold - Sidelying ITB Stretch off Table  - 1 x daily - 7 x weekly - 3 sets - 30s hold - Supine ITB Stretch with Strap  - 1 x daily - 7 x weekly - 3 sets - 30s hold - Supine 90/90 Alternating Toe Touch  - 1 x daily - 7 x weekly - 3 sets - 10 reps - Tall Kneeling Posterior Pelvic Tilt  - 1 x daily - 7  x weekly - 3 sets - 10 reps   GOALS: Goals reviewed with patient? Yes   NEW SHORT TERM GOALS: 12/09/23  1. Pt will be independent with updated HEP for improved functional strength and endurance    Baseline: to be updated   Goal status: NEW   2. Patient will ambulate >/= 464ft during with LRAD ModI for improved cardiovascular endurance and BLE strength    Baseline: 280ft no AD, ModI    Goal status: NEW   3. Pt will improve gait speed to >/= .26m/s to demonstrate improved community ambulation   Baseline: .26m/s   Goal status: NEW   NEW LONG TERM GOALS: 01/06/24  1. Patient will demonstrate a good understanding of her diagnosis (FND) and resources available to her    Baseline: benefits from further education   Goal status: NEW    2. Pt will be independent with final aquatic HEP for improved trunk control and return to community exercise Baseline: to be provided Goal status: NEW  3. Pt will improve gait speed to >/= .67m/s to demonstrate improved community ambulation  Baseline: .54m/s  Goal status: NEW  4. Patient will ambulate >/= 542ft during with LRAD and ModI to demonstrate improved cardiovascular endurance and functional LE strength   Baseline: 231ft   Goal status: NEW   ASSESSMENT:  CLINICAL IMPRESSION: Session somewhat limited due to pt being late today but she did very well with all tasks today, including addition of jumping for coordination along with resisted walking for strengthening.      OBJECTIVE IMPAIRMENTS: Abnormal gait, cardiopulmonary status limiting activity, decreased activity tolerance, decreased balance, decreased coordination, decreased endurance, decreased knowledge of condition, decreased knowledge of use of DME, decreased mobility, difficulty walking, impaired perceived functional ability, impaired sensation, improper body mechanics, and postural dysfunction.   ACTIVITY LIMITATIONS: carrying, lifting, bending, sitting, standing, squatting,  sleeping, stairs, transfers, and bed mobility  PARTICIPATION LIMITATIONS: meal prep, cleaning, laundry, interpersonal relationship, driving, shopping, community activity, and occupation  PERSONAL FACTORS: Time since onset of injury/illness/exacerbation and 1-2 comorbidities: anxiety and focal dystonia are also affecting patient's functional outcome.   REHAB POTENTIAL: Good  CLINICAL DECISION MAKING: Evolving/moderate complexity  EVALUATION COMPLEXITY: High  PLAN:  PT FREQUENCY: 2x/week 2x a week (re-cert)  PT DURATION: 6 weeks 8 weeks (re-cert)  PLANNED INTERVENTIONS: 02835- PT Re-evaluation, 97750- Physical Performance Testing, 97110-Therapeutic exercises, 97530- Therapeutic activity, [id number]- Neuromuscular re-education, 97535- Self Care, 02859- Manual therapy, [id number]- Gait training, [phone]- Aquatic Therapy, [phone]- Electrical stimulation (manual), [phone] (1-2 muscles), [phone] (3+ muscles)- Dry Needling, Patient/Family education, Balance training, Stair training, Taping, Joint mobilization, Spinal mobilization, DME instructions, Cryotherapy, and  Moist heat  PLAN FOR NEXT SESSION: assess seated BP, quadruped and tall kneel to work on proximal stability, hip abd strength, work on core strengthening, resisted EVA walker? Lumbo-pelvic dissociation, carrying a distracting object, response to TPDN? Assess STG again    Aquatic: needs to work on progressing toward normalizing movement, needs to work on core control, but also lumbo-pelvic dissociation/ rotational movements  Damien Fought, PT, MPT [facility] [address] [place], [place], [address] Phone: [phone]   Fax:  [phone] 12/20/23, 1:10 PM

## 2023-12-21 ENCOUNTER — Ambulatory Visit

## 2023-12-26 ENCOUNTER — Ambulatory Visit: Admitting: Speech Pathology

## 2023-12-26 ENCOUNTER — Ambulatory Visit: Admitting: Physical Therapy

## 2023-12-26 NOTE — Therapy (Incomplete)
 OUTPATIENT PHYSICAL THERAPY NEURO TREATMENT   Patient Name: Lauren Levy MRN: [id number] DOB:November 26, 1970, 53 y.o., female Today's Date: 12/26/2023   PCP: Vernon Velna SAUNDERS, MD REFERRING PROVIDER: Hans Cinderella Duck, MD/ recert sent to PCP  END OF SESSION:         Past Medical History:  Diagnosis Date   Ovarian cyst    Past Surgical History:  Procedure Laterality Date   BREAST BIOPSY Right 02/16/2021   papilloma   BREAST LUMPECTOMY Right 04/14/2021   BREAST LUMPECTOMY WITH RADIOACTIVE SEED LOCALIZATION Right 04/14/2021   Procedure: RIGHT BREAST LUMPECTOMY WITH RADIOACTIVE SEED LOCALIZATION;  Surgeon: Vanderbilt Ned, MD;  Location: [facility];  Service: General;  Laterality: Right;   KNEE SURGERY Right    MINOR CARPAL TUNNEL Right    Patient Active Problem List   Diagnosis Date Noted   Anxiety 01/12/2022   Foot pain, right 01/12/2022   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  Rationale for Evaluation and Treatment: Rehabilitation  SUBJECTIVE:                                                                                                                                                                                             SUBJECTIVE STATEMENT: Pt continues to enter appointment with her knee scooter.  Pt continues to be tired, continues to not be able to sleep well because her muscles won't relax. Pt reports she did reach out to her doctor about her inability to sleep. Pt asking to focus on her back strength today as she believes this is what is affecting her walking.  Encouraged her to reach out to [facility] neurology about getting a neuropsych referral and emphasized importance of this in treating her FND.  ***  Pt  accompanied by: self  PERTINENT HISTORY: PMH: anxiety, focal dystonia  PAIN:  Are you having pain? No  PRECAUTIONS: None  PATIENT GOALS: see if we can help me talk without closing my eyes, strengthen my back so that I can sit like normal people (slides out of chair when sitting normally), help with my walking  TREATMENT  Self-Care/Home Management There were no vitals filed for this visit.  BP: Assessed in LUE at rest. BP elevated but improved as compared to previous sessions. Pt on medication for her BP which increased dosage today and her medical team is aware of her ongoing hypertension. ***   TherAct ***      PATIENT EDUCATION: Education details: see above, TPDN*** Education method: Explanation, Demonstration, and Handouts Education comprehension: verbalized understanding, returned demonstration, and needs further education  HOME EXERCISE PROGRAM: -sit at dinning table for 1 meal with upright posture picturing self in mirror  Start with trying to sit upright x 15 sec without changing position, add 15 sec as this gets easier -work on sit to stands in front of full-length mirror at home, keep eyes open Add in counting up by 1's as this gets easier  -sidelying quad stretch 3 x 30 sec each B (verbally added 6/30, pt declines handout)  Access Code: [email or web address] URL: [email or web address] Date: 08/17/2023 Prepared by: Waddell Southgate  Exercises - Supine Bridge  - 1 x daily - 7 x weekly - 3 sets - 10 reps - Supine Quadricep Sets  - 1 x daily - 7 x weekly - 3 sets - 10 reps - Seated Hip Adduction Isometrics with Ball  - 1 x daily - 7 x weekly - 3 sets - 10 reps - 5 sec hold - Sidelying ITB Stretch off Table  - 1 x daily - 7 x weekly - 3 sets - 30s hold - Supine ITB Stretch with Strap  - 1 x daily - 7 x weekly - 3 sets - 30s hold -  Supine 90/90 Alternating Toe Touch  - 1 x daily - 7 x weekly - 3 sets - 10 reps - Tall Kneeling Posterior Pelvic Tilt  - 1 x daily - 7 x weekly - 3 sets - 10 reps   GOALS: Goals reviewed with patient? Yes   NEW SHORT TERM GOALS: 12/09/23***  1. Pt will be independent with updated HEP for improved functional strength and endurance    Baseline: to be updated   Goal status: NEW   2. Patient will ambulate >/= 461ft during with LRAD ModI for improved cardiovascular endurance and BLE strength    Baseline: 281ft no AD, ModI    Goal status: NEW   3. Pt will improve gait speed to >/= .32m/s to demonstrate improved community ambulation   Baseline: .80m/s   Goal status: NEW   NEW LONG TERM GOALS: 01/06/24  1. Patient will demonstrate a good understanding of her diagnosis (FND) and resources available to her    Baseline: benefits from further education   Goal status: NEW    2. Pt will be independent with final aquatic HEP for improved trunk control and return to community exercise Baseline: to be provided Goal status: NEW  3. Pt will improve gait speed to >/= .68m/s to demonstrate improved community ambulation  Baseline: .44m/s  Goal status: NEW  4. Patient will ambulate >/= 586ft during with LRAD and ModI to demonstrate improved cardiovascular endurance and functional LE strength   Baseline: 286ft   Goal status: NEW   ASSESSMENT:  CLINICAL IMPRESSION: Emphasis of skilled PT session on*** initiating TPDN and TENS to address ongoing pain and muscle tightness in her LUT. Pt with decreased pain symptoms following treatment this date. Remainder of session focus on core strengthening and address weakness with pelvic rotation, added new exercises to her HEP. Plan to reassess her STG  during next land visit. She continues to benefit from skilled PT services to work towards improving her function and normalizing her movement patterns as best as possible. Continue POC.    OBJECTIVE  IMPAIRMENTS: Abnormal gait, cardiopulmonary status limiting activity, decreased activity tolerance, decreased balance, decreased coordination, decreased endurance, decreased knowledge of condition, decreased knowledge of use of DME, decreased mobility, difficulty walking, impaired perceived functional ability, impaired sensation, improper body mechanics, and postural dysfunction.   ACTIVITY LIMITATIONS: carrying, lifting, bending, sitting, standing, squatting, sleeping, stairs, transfers, and bed mobility  PARTICIPATION LIMITATIONS: meal prep, cleaning, laundry, interpersonal relationship, driving, shopping, community activity, and occupation  PERSONAL FACTORS: Time since onset of injury/illness/exacerbation and 1-2 comorbidities: anxiety and focal dystonia are also affecting patient's functional outcome.   REHAB POTENTIAL: Good  CLINICAL DECISION MAKING: Evolving/moderate complexity  EVALUATION COMPLEXITY: High  PLAN:  PT FREQUENCY: 2x/week 2x a week (re-cert)  PT DURATION: 6 weeks 8 weeks (re-cert)  PLANNED INTERVENTIONS: 02835- PT Re-evaluation, 97750- Physical Performance Testing, 97110-Therapeutic exercises, 97530- Therapeutic activity, [id number]- Neuromuscular re-education, 97535- Self Care, 02859- Manual therapy, [id number]- Gait training, [phone]- Aquatic Therapy, [phone]- Electrical stimulation (manual), [phone] (1-2 muscles), 20561 (3+ muscles)- Dry Needling, Patient/Family education, Balance training, Stair training, Taping, Joint mobilization, Spinal mobilization, DME instructions, Cryotherapy, and Moist heat  PLAN FOR NEXT SESSION: assess seated BP, quadruped and tall kneel to work on proximal stability, hip abd strength, work on core strengthening, resisted EVA walker? Lumbo-pelvic dissociation, carrying a distracting object, response to TPDN? Assess STG again ***   Aquatic: needs to work on progressing toward normalizing movement, needs to work on core control, but also lumbo-pelvic  dissociation/ rotational movements   Waddell Southgate, PT Waddell Southgate, PT, DPT, CSRS     12/26/2023, 7:50 AM

## 2023-12-27 ENCOUNTER — Encounter: Payer: Self-pay | Admitting: Rehabilitation

## 2023-12-27 ENCOUNTER — Ambulatory Visit: Payer: Self-pay | Admitting: Rehabilitation

## 2023-12-27 DIAGNOSIS — R2689 Other abnormalities of gait and mobility: Secondary | ICD-10-CM

## 2023-12-27 DIAGNOSIS — R2681 Unsteadiness on feet: Secondary | ICD-10-CM

## 2023-12-27 NOTE — Therapy (Signed)
 OUTPATIENT PHYSICAL THERAPY NEURO TREATMENT   Patient Name: Lauren Levy MRN: [id number] DOB:January 01, 1971, 53 y.o., female Today's Date: 12/27/2023   PCP: Vernon Velna SAUNDERS, MD REFERRING PROVIDER: Hans Cinderella Duck, MD/ recert sent to PCP  END OF SESSION:  PT End of Session - 12/27/23 0827     Visit Number 29    Number of Visits 44    Date for Recertification  01/06/24    Authorization Type Aetna    Authorization Time Period VL:MN    PT Start Time 1145    PT Stop Time 1230    PT Time Calculation (min) 45 min    Equipment Utilized During Treatment Gait belt;Other (comment)   floatation devices as needed   Activity Tolerance Patient tolerated treatment well    Behavior During Therapy [facility] for tasks assessed/performed               Past Medical History:  Diagnosis Date   Ovarian cyst    Past Surgical History:  Procedure Laterality Date   BREAST BIOPSY Right 02/16/2021   papilloma   BREAST LUMPECTOMY Right 04/14/2021   BREAST LUMPECTOMY WITH RADIOACTIVE SEED LOCALIZATION Right 04/14/2021   Procedure: RIGHT BREAST LUMPECTOMY WITH RADIOACTIVE SEED LOCALIZATION;  Surgeon: Vanderbilt Ned, MD;  Location: [facility];  Service: General;  Laterality: Right;   KNEE SURGERY Right    MINOR CARPAL TUNNEL Right    Patient Active Problem List   Diagnosis Date Noted   Anxiety 01/12/2022   Foot pain, right 01/12/2022   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   Dystonia 06/03/2021   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 Husband present for session today.   Pt reports feeling very good today, no pain.    Did not formally assess BP this session as MD team/treatment team aware it is high and telling pt seems to increase anxiety.   Pt accompanied by: self  PERTINENT HISTORY: PMH: anxiety, focal dystonia  PAIN:  Are you having pain? No  PRECAUTIONS: None  PATIENT GOALS: see if we can help me talk without closing my eyes,  strengthen my back so that I can sit like normal people (slides out of chair when sitting normally), help with my walking                                                                                                                               TREATMENT   Patient seen for aquatic therapy today.  Treatment took place in water 3.6-4.8 feet deep depending upon activity.  Pt entered and exited the pool via stairs using B rails for support.  Pool temp approx 92 deg.    Warm Up:  Walking forwards x approx 18' x 4 laps, backwards walking x 4 laps, all without support then  side stepping with yellow bar bells into and out of water with abd/add x 4 more laps.     Forward marching with yellow barbells for support x 2 laps, adding in alt arm motions x 2 more laps without assist from PT today.  Standing hip circles x 10 reps clockwise and 10 reps CCW with intermittent light support, cues for improved proximal mm activation on stance leg.  Standing hip flex/ext (with knee ext) x 10 reps each side with light intermittent support from wall.  Note improved balance on LLE, still needs some support when on RLE.  Sit<>stand from pool bench with feet on small step x 10 reps reaching forward, with min cues to not over extend trunk when fully upright.  Progressed to holding 7lb outstretched during transition for 10 more reps.  Resisted side stepping x 2 laps, resisted forward walking x 2 laps (added in  external purturbations in random direction for second lap with good step strategy to correct.  Braiding x 4 laps without UE support.  Stepping in clock pattern (12,3,6,9) with crossover x 4 rounds on each side without UE support.  Improved control with crossover step as we progressed.  180 deg turns performing with single step and without support.  Pt demo's improved balance and control with increased repetition.  Staggered stance, shifting ant/post with arms moving apart when post and together when ant with adding in opposite leg lift x 10 reps.    Sitting on large white noodle saddle style maintaining balance x 20-30 secs then alternating leg kicks x 10 reps>bicycling across pool x 2 laps.  Pt reporting increased leg fatigue at end of 2nd lap.  Allowed rest breaks as needed throughout.      Ended with forward walking x 2 laps without support providing cues to only flex leg enough to clear. She does well but reports this fatigues leg.      Pt requires buoyancy of water for support for reduced fall risk and for unloading/reduced stress on joints as pt able to tolerate increased standing and ambulation in water compared to that on land; viscosity of water is needed for resistance for strengthening and current of water provides perturbations for challenge for balance training  PATIENT EDUCATION: Education details: aquatic rationale  Education method: Solicitor, and Handouts Education comprehension: verbalized understanding, returned demonstration, and needs further education  HOME EXERCISE PROGRAM: -sit at dinning table for 1 meal with upright posture picturing self in mirror  Start with trying to sit upright x 15 sec without changing position, add 15 sec as this gets easier -work on sit to stands in front of full-length mirror at home, keep eyes open Add in counting up by 1's as this gets easier  -sidelying quad stretch 3 x 30 sec each B (verbally added 6/30, pt declines  handout)  Access Code: [email or web address] URL: [email or web address] Date: 08/17/2023 Prepared by: Waddell Southgate  Exercises - Supine Bridge  - 1 x daily - 7 x weekly - 3 sets - 10 reps - Supine Quadricep Sets  - 1 x daily - 7 x weekly - 3 sets - 10 reps - Seated Hip Adduction Isometrics with Ball  - 1 x daily - 7 x weekly - 3 sets - 10 reps - 5 sec hold - Sidelying ITB Stretch off Table  - 1 x daily - 7 x weekly - 3 sets - 30s hold - Supine ITB Stretch with Strap  - 1 x daily - 7 x weekly - 3 sets - 30s hold - Supine 90/90 Alternating Toe Touch  - 1 x daily - 7 x weekly - 3 sets - 10 reps - Tall Kneeling Posterior Pelvic Tilt  - 1 x daily - 7 x weekly - 3 sets - 10 reps   GOALS: Goals reviewed with patient? Yes   NEW SHORT TERM GOALS: 12/09/23  1. Pt will be independent with updated HEP for improved functional strength and endurance    Baseline: to be updated   Goal status: NEW   2. Patient will ambulate >/= 456ft during with LRAD ModI for improved cardiovascular endurance and BLE strength    Baseline: 236ft no AD, ModI    Goal status: NEW   3. Pt will improve gait speed to >/= .65m/s to demonstrate improved community ambulation   Baseline: .13m/s   Goal status: NEW   NEW LONG TERM GOALS: 01/06/24  1. Patient will demonstrate a good understanding of her diagnosis (FND) and resources available to her    Baseline: benefits from further education   Goal status: NEW    2. Pt will be independent with final aquatic HEP for improved trunk control and return to community exercise Baseline: to be provided Goal status: NEW  3. Pt will improve gait speed to >/= .82m/s to demonstrate improved community ambulation  Baseline: .75m/s  Goal status: NEW  4. Patient will ambulate >/= 569ft during with LRAD and ModI to demonstrate improved cardiovascular endurance and functional LE strength   Baseline: 278ft   Goal status: NEW   ASSESSMENT:  CLINICAL IMPRESSION: Pt did  very well today in pool.  Tolerated more resisted challenges and needed less external support for SLS/tandem challenges.      OBJECTIVE IMPAIRMENTS: Abnormal gait, cardiopulmonary status limiting activity, decreased activity tolerance, decreased balance, decreased coordination, decreased endurance, decreased knowledge of condition, decreased knowledge of use of DME, decreased mobility, difficulty walking, impaired perceived functional ability, impaired sensation, improper body mechanics, and postural dysfunction.   ACTIVITY LIMITATIONS: carrying, lifting, bending, sitting, standing, squatting, sleeping, stairs, transfers, and bed mobility  PARTICIPATION LIMITATIONS: meal prep, cleaning, laundry, interpersonal relationship, driving, shopping, community activity, and occupation  PERSONAL FACTORS: Time since onset of injury/illness/exacerbation and 1-2 comorbidities:  anxiety and focal dystonia are also affecting patient's functional outcome.   REHAB POTENTIAL: Good  CLINICAL DECISION MAKING: Evolving/moderate complexity  EVALUATION COMPLEXITY: High  PLAN:  PT FREQUENCY: 2x/week 2x a week (re-cert)  PT DURATION: 6 weeks 8 weeks (re-cert)  PLANNED INTERVENTIONS: 02835- PT Re-evaluation, 97750- Physical Performance Testing, 97110-Therapeutic exercises, 97530- Therapeutic activity, [id number]- Neuromuscular re-education, 97535- Self Care, 02859- Manual therapy, [id number]- Gait training, [phone]- Aquatic Therapy, [id number]- Electrical stimulation (manual), [phone] (1-2 muscles), [id number] (3+ muscles)- Dry Needling, Patient/Family education, Balance training, Stair training, Taping, Joint mobilization, Spinal mobilization, DME instructions, Cryotherapy, and Moist heat  PLAN FOR NEXT SESSION: assess seated BP, quadruped and tall kneel to work on proximal stability, hip abd strength, work on core strengthening, resisted EVA walker? Lumbo-pelvic dissociation, carrying a distracting object, response to TPDN? Assess STG  again    Aquatic: needs to work on progressing toward normalizing movement, needs to work on core control, but also lumbo-pelvic dissociation/ rotational movements  Damien Fought, PT, MPT [facility] [address] [place], [place], [address] Phone: [phone]   Fax:  [phone] 12/27/23, 12:31 PM

## 2023-12-28 ENCOUNTER — Ambulatory Visit

## 2023-12-29 DIAGNOSIS — F411 Generalized anxiety disorder: Secondary | ICD-10-CM | POA: Diagnosis not present

## 2024-01-02 ENCOUNTER — Ambulatory Visit: Admitting: Physical Therapy

## 2024-01-02 ENCOUNTER — Encounter: Payer: Self-pay | Admitting: Speech Pathology

## 2024-01-02 ENCOUNTER — Ambulatory Visit: Admitting: Speech Pathology

## 2024-01-02 DIAGNOSIS — R2681 Unsteadiness on feet: Secondary | ICD-10-CM

## 2024-01-02 DIAGNOSIS — R4789 Other speech disturbances: Secondary | ICD-10-CM

## 2024-01-02 DIAGNOSIS — R2689 Other abnormalities of gait and mobility: Secondary | ICD-10-CM

## 2024-01-02 NOTE — Patient Instructions (Addendum)
   [facility] (neuro- chiropractor)  [email or web address]  We are trying to re-program your soft ware - sometime hyperfocus gets in the way because you are trying too hard  A distraction can be a benefit to relieve the hyper focus  This week, continue straw humming and regular hum (mmm) with relaxed eyes open

## 2024-01-02 NOTE — Therapy (Signed)
 OUTPATIENT PHYSICAL THERAPY NEURO TREATMENT - RECERT***   Patient Name: Lauren Levy MRN: [id number] DOB:06-21-1970, 53 y.o., female Today's Date: 01/02/2024   PCP: Vernon Velna SAUNDERS, MD REFERRING PROVIDER: Hans Cinderella Duck, MD/ recert sent to PCP  END OF SESSION:  PT End of Session - 01/02/24 1447     Visit Number 30    Number of Visits 44    Date for Recertification  02/27/24   recert   Authorization Type Aetna    Authorization Time Period VL:MN    PT Start Time 1445    PT Stop Time 1527    PT Time Calculation (min) 42 min    Equipment Utilized During Treatment Gait belt    Activity Tolerance Patient tolerated treatment well    Behavior During Therapy WFL for tasks assessed/performed                Past Medical History:  Diagnosis Date   Ovarian cyst    Past Surgical History:  Procedure Laterality Date   BREAST BIOPSY Right 02/16/2021   papilloma   BREAST LUMPECTOMY Right 04/14/2021   BREAST LUMPECTOMY WITH RADIOACTIVE SEED LOCALIZATION Right 04/14/2021   Procedure: RIGHT BREAST LUMPECTOMY WITH RADIOACTIVE SEED LOCALIZATION;  Surgeon: Vanderbilt Ned, MD;  Location: [facility];  Service: General;  Laterality: Right;   KNEE SURGERY Right    MINOR CARPAL TUNNEL Right    Patient Active Problem List   Diagnosis Date Noted   Anxiety 01/12/2022   Foot pain, right 01/12/2022   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   Dystonia 06/03/2021   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  Pt reports that she feels like her R leg is longer than her L leg, starting getting a pain in L lower back/hip due to misalignment. She also still gets L upper trap pain when tilting her head back to the L but when turning her head to the L or tilting it to the L she feels ok which is an improvement compared to before DN last land visit.  Pt feels like she has lost  control of her back, wants to focus on strengthening her core and her back.  Pt was doing good and progressing with therapy then she started not sleeping well and she lost a lot of her progress, started taking some medication to help her sleep and she has noticed some improvement (trazadone).  She has a [facility] appt on Wed and going to discuss neuropsychology, done with Speech Therapy after today.  Pt accompanied by: self  PERTINENT HISTORY: PMH: anxiety, focal dystonia  PAIN:  Are you having pain? No  PRECAUTIONS: None  PATIENT GOALS: see if we can help me talk without closing my eyes, strengthen my back so that I can sit like normal people (slides out of chair when sitting normally), help with my walking                                                                                                                               TREATMENT  Self-Care/Home Management There were no vitals filed for this visit.  BP: Pt on medication for her BP which increased dosage today and her medical team is aware of her ongoing hypertension. Pt assesses with her personal cuff and diastolic remains elevated (104). Decided to proceed with session despite hypertension as pt is medicated and her medical team is aware.    Physical Performance For LTG assessment:  [facility] PT Assessment - 01/02/24 1508       Ambulation/Gait   Gait velocity 32.8 ft over 14.4 sec = 2.78 ft/sec         : 178 ft no AD, SBA, carrying green spiky ball for  distraction  TherAct Trigger Point Dry Needling  Subsequent Treatment: Instructions provided previously at initial dry needling treatment.   Patient Verbal Consent Given: Yes Education Handout Provided: Previously Provided Muscles Treated: L upper trap Electrical Stimulation Performed: No Treatment Response/Outcome: deep ache/pressue; muscle twitch detected  For LLD assessment in supine: RLE: 36 LLE: 36.5 (longer, and pt has noticed this) Educated patient on functional LLD and that this is likely due to muscle imbalance and tightness in her hips and lower back from the way she has been moving her LE to ambulate. Plan to address LLD with stretching of tight muscles on her R side and can also address with a lift if needed.  To address low back and hip tightness: Supine modified Thomas stretch RLE 3 x 30 sec Seated B QL stretch 3 x 30 sec each   Verbally added to HEP, see bolded below   PATIENT EDUCATION: Education details: results of OM and functional implications, TPDN, verbally added to HEP, see above regarding LLD Education method: Explanation, Demonstration, and Handouts Education comprehension: verbalized understanding, returned demonstration, and needs further education  HOME EXERCISE PROGRAM: -sit at dinning table for 1 meal with upright posture picturing self in mirror  Start with trying to sit upright x 15 sec without changing position, add 15 sec as this gets easier -work  on sit to stands in front of full-length mirror at home, keep eyes open Add in counting up by 1's as this gets easier  -sidelying quad stretch 3 x 30 sec each B (verbally added 6/30, pt declines handout)  Access Code: [email or web address] URL: [email or web address] Date: 08/17/2023 Prepared by: Waddell Southgate  Exercises - Supine Bridge  - 1 x daily - 7 x weekly - 3 sets - 10 reps - Supine Quadricep Sets  - 1 x daily - 7 x weekly - 3 sets - 10 reps - Seated Hip Adduction Isometrics with Ball   - 1 x daily - 7 x weekly - 3 sets - 10 reps - 5 sec hold - Sidelying ITB Stretch off Table  - 1 x daily - 7 x weekly - 3 sets - 30s hold - Supine ITB Stretch with Strap  - 1 x daily - 7 x weekly - 3 sets - 30s hold - Supine 90/90 Alternating Toe Touch  - 1 x daily - 7 x weekly - 3 sets - 10 reps - Tall Kneeling Posterior Pelvic Tilt  - 1 x daily - 7 x weekly - 3 sets - 10 reps   GOALS: Goals reviewed with patient? Yes   NEW SHORT TERM GOALS: 12/09/23***  1. Pt will be independent with updated HEP for improved functional strength and endurance    Baseline: to be updated   Goal status: NEW   2. Patient will ambulate >/= 48ft during with LRAD ModI for improved cardiovascular endurance and BLE strength    Baseline: 233ft no AD, ModI    Goal status: NEW   3. Pt will improve gait speed to >/= .54m/s to demonstrate improved community ambulation   Baseline: .95m/s   Goal status: NEW   NEW LONG TERM GOALS: 01/06/24  1. Patient will demonstrate a good understanding of her diagnosis (FND) and resources available to her    Baseline: benefits from further education, will discuss referral to neuropsych with [facility] on 11/25   Goal status: IN PROGRESS   2. Pt will be independent with final aquatic HEP for improved trunk control and return to community exercise Baseline: to be provided, ongoing Goal status: IN PROGRESS  3. Pt will improve gait speed to >/= .66m/s to demonstrate improved community ambulation  Baseline: .34m/s, 0.84 m/s  Goal status: MET  4. Patient will ambulate >/= 544ft during with LRAD and ModI to demonstrate improved cardiovascular endurance and functional LE strength   Baseline: 225ft, 178 ft (11/24)   Goal status: IN PROGRESS   NEW SHORT TERM GOALS:   Target date: 01/30/2024   Pt will report having a referral to neuropsychology to address her FND Baseline: will discuss referral to neuropsych with [facility] on 11/25 Goal status: INITIAL   NEW  LONG TERM GOALS:  Target date: 02/27/2024   Pt will report working with neuropsychology on a consistent basis to address her FND Baseline: will discuss referral to neuropsych with [facility] on 11/25 Goal status: INITIAL  2.  *** Baseline: *** Goal status: {GOALSTATUS:25110}  3.  *** Baseline: *** Goal status: {GOALSTATUS:25110}  4.  *** Baseline: *** Goal status: {GOALSTATUS:25110}  5.  *** Baseline: *** Goal status: {GOALSTATUS:25110}  6.  *** Baseline: *** Goal status: {GOALSTATUS:25110}    ASSESSMENT:  CLINICAL IMPRESSION: Emphasis of skilled PT session on assessing LTG and writing new STG and LTG for recertification of PT services. Pt has made progress towards 3/4 LTG assessed this date  as she is making progress towards better understanding her diagnosis of FND, is making progress towards independence with a final land and aquatic HEP, and is making progress with distance covered on the . She did meet 1/4 LTG as she improved her gait speed as compared to her initial assessment. However, she continues to remain limited by her diagnosis of FND with an improved understanding of her diagnosis as compared to initial assessment but still with a poor understanding of it overall and how impacts her function. Pt has sought out mental health counseling services however she has not yet seen a neuropsychologist to further address her impairments. She has a follow-up with [facility] tomorrow with plans to ask for a neuropscyh referral at that time. Pt can benefit from an interdisciplinary approach to treating her FND and will benefit immensely from this referral to neuropsychology. Her progress has also been limited as she started having issues sleeping and experienced a setback in her physical functional progress. She has regressed to using a knee scooter for most of her mobility and has significant difficulty ambulating due to onset of RLE cramping. She also continues to have  difficulty keeping her eyes open while speaking or while performing any movements. She continues to benefit from skilled PT services to work towards improving her function and normalizing her movement patterns as best as possible. Continue POC.    OBJECTIVE IMPAIRMENTS: Abnormal gait, cardiopulmonary status limiting activity, decreased activity tolerance, decreased balance, decreased coordination, decreased endurance, decreased knowledge of condition, decreased knowledge of use of DME, decreased mobility, difficulty walking, impaired perceived functional ability, impaired sensation, improper body mechanics, and postural dysfunction.   ACTIVITY LIMITATIONS: carrying, lifting, bending, sitting, standing, squatting, sleeping, stairs, transfers, and bed mobility  PARTICIPATION LIMITATIONS: meal prep, cleaning, laundry, interpersonal relationship, driving, shopping, community activity, and occupation  PERSONAL FACTORS: Time since onset of injury/illness/exacerbation and 1-2 comorbidities: anxiety and focal dystonia are also affecting patient's functional outcome.   REHAB POTENTIAL: Good  CLINICAL DECISION MAKING: Evolving/moderate complexity  EVALUATION COMPLEXITY: High  PLAN:  PT FREQUENCY: 2x/week 2x a week (re-cert)  PT DURATION: 6 weeks 8 weeks (re-cert) + 8 weeks (recert)  PLANNED INTERVENTIONS: 02835- PT Re-evaluation, 97750- Physical Performance Testing, 97110-Therapeutic exercises, 97530- Therapeutic activity, [id number]- Neuromuscular re-education, 97535- Self Care, 02859- Manual therapy, [id number]- Gait training, [phone]- Aquatic Therapy, [phone]- Electrical stimulation (manual), [phone] (1-2 muscles), 20561 (3+ muscles)- Dry Needling, Patient/Family education, Balance training, Stair training, Taping, Joint mobilization, Spinal mobilization, DME instructions, Cryotherapy, and Moist heat  PLAN FOR NEXT SESSION: assess seated BP, quadruped and tall kneel to work on proximal stability, hip abd  strength, work on core strengthening, resisted EVA walker? Lumbo-pelvic dissociation, carrying a distracting object, response to TPDN?***   Aquatic: needs to work on progressing toward normalizing movement, needs to work on core control, but also lumbo-pelvic dissociation/ rotational movements   Waddell Southgate, PT Waddell Southgate, PT, DPT, CSRS     01/02/2024, 3:46 PM

## 2024-01-02 NOTE — Therapy (Signed)
 OUTPATIENT SPEECH LANGUAGE PATHOLOGY TREATMENT NOTE & DISCHARGE SUMMARY   Patient Name: Lauren Levy MRN: [id number] DOB:Aug 30, 1970, 53 y.o., female Today's Date: 01/02/2024  PCP: Lauren Velna SAUNDERS, MD REFERRING PROVIDER: Vernon Velna SAUNDERS, MD  END OF SESSION:  End of Session - 01/02/24 1406     Visit Number 8    Number of Visits 10    Date for Recertification  01/30/24    SLP Start Time 1404    SLP Stop Time  1445    SLP Time Calculation (min) 41 min    Activity Tolerance Patient tolerated treatment well             Past Medical History:  Diagnosis Date   Ovarian cyst    Past Surgical History:  Procedure Laterality Date   BREAST BIOPSY Right 02/16/2021   papilloma   BREAST LUMPECTOMY Right 04/14/2021   BREAST LUMPECTOMY WITH RADIOACTIVE SEED LOCALIZATION Right 04/14/2021   Procedure: RIGHT BREAST LUMPECTOMY WITH RADIOACTIVE SEED LOCALIZATION;  Surgeon: Lauren Ned, MD;  Location: [facility];  Service: General;  Laterality: Right;   KNEE SURGERY Right    MINOR CARPAL TUNNEL Right    Patient Active Problem List   Diagnosis Date Noted   Anxiety 01/12/2022   Foot pain, right 01/12/2022   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 She reports that her R foot used to turn in and she would have to walk on her toes because her toes would curl under due to this but she had knee surgery to correct it. She reports that recently though  her movements have gotten worse, her whole body will tense up and she is unable to open her eyes and speak at the same time. She reports that speaking is very difficult, she finds herself holding her breath and feels like there is a weight sitting on her chest.   She also reports getting fatigued very easily, even just walking up the stairs at home to get to her bedroom/bathroom she will find herself out of breath. Additionally, when she wakes up first thing in the the morning she does not have any of these symptoms, they come on gradually throughout the day. Consult with movement disorder specialist was negative.   PAIN:  Are you having pain? No  FALLS: Has patient fallen in last 6 months? No, Number of falls: See PT eval  LIVING ENVIRONMENT: Lives with: lives with their family and lives with an adult companion Lives in: House/apartment  PLOF:Level of assistance: Independent with ADLs, Independent with IADLs Employment: Part-time employment  PATIENT GOALS: To speak with ease  OBJECTIVE:  Note: Objective measures were completed at Evaluation unless otherwise noted.  DIAGNOSTIC FINDINGS: 2023 MRI noirmal  COGNITION: Overall cognitive status: Within functional limits for tasks assessed  SOCIAL HISTORY: Occupation: realtor part time Water intake: optimal Caffeine/alcohol intake: minimal Daily voice use: moderate  PERCEPTUAL VOICE ASSESSMENT: Voice quality: harsh, strained, and vocal fatigue Vocal abuse: n/a Resonance: normal Respiratory function: thoracic breathing and clavicular  breathing  OBJECTIVE VOICE ASSESSMENT: Maximum phonation time for sustained ah: 5.14 Conversational loudness average: 73 dB Conversational loudness range: 72-74 dB S/z ratio: 1.09 (Suggestive of dysfunction >1.4)  PATIENT REPORTED OUTCOME MEASURES (PROM): VRQOL 32 Rated a 5 or as bad as it can be running out of air when talking, being less outgoing due to voice. A 4 , or a lot of problem speaking  loudly, doing her profession because of her voice and avoiding going out socially because of voice                                                                                                                            TREATMENT DATE:   01/02/24: [name] enters with new sleeping pills - she is sleeping well now. She continues to demonstrate extraneous facial, eye and body movements with speech. She states when she keeps her eyes open and talks, her throat closes. Today we targeted SOVTE to eliminate throat tension, while maintaining still face and open eyes - with straw hum and usual mod verbal cues and modeling, she coordinates open eyes and phonation 8/10 trials, with resonant voice exercises and usual mod modeling and verbal cues she maintained clear phonation and eyes open 7/10 trials. Tearing noted throughout session. We discussed hyper focus on her symptoms and she agrees that this does make it worse. At this time, d/c ST and f/u with [facility] -   12/19/23: [name] noted that she has not been sleeping well, which has exacerbated her symptoms including her speech difficulties. SLP required [name] to utilize SOVTEs (straw phonation) to facilitate vocal muscle relaxation. She performed SOVTEs with 80% consistency when given frequent mod A. SLP increased challenge to syllable and word level speech production (ex. /vi, wi/). [name] verbalized at syllable and word-level with 75% consistency for overall relaxation during speech production. She continues to consistently close her eyes during any form of speech production, requiring frequent verbal max A. SLP introduced progressive relaxation during diaphragmatic breathing to decrease muscular tension during speech. [name] performed progressive relaxation appropriately in 80% of opportunities when given frequent mod A.   10/03/23: Pt reports she had a good day Saturday, but has returned to baseline. She enters with dyskinesias, eye closure and facial dyskinesias.  She reports she was unable to maintain eye opening over phone calls - review strategy of eyes scanning the room while counting, saying days, months, counting by 5's. Targeted conversational sentences focusing on relaxed body, face - most beneficial to maintain fluent speech with eyes open was to use scanning in various patterns while speaking in simple repeated phrases and sentences. She maintained eyes open 7/10 sentences and in conversation for 4 turns after structured practice.   09/27/23: She was unable to practice her HEP due to spouse breaking his ankle and moving her daughter into her college dorm. In initial conversation, with verbal cues 3x over 5 minutes, she was able to correct closed eyes and continue conversation with eyes  open. Structured speech task counting by 5's to 100 forward and backward, saying months backward, she kept eyes open 85% of words with min verbal cues. Progressed to standing, coordinating eyes open, relaxed body humming, syllables, counting and song in nasal cv syllables (ma) - with occasional verbal cues, Makaylah completed phonation and speech tasks with eyes open in standing position. HEP see Patient Instructions  09/21/23: Jashley enters with WNL gait, speech and facial expression. When I pointed this out, dyskinesias and eye closure began. Kairie states she has been having episodes of normal speech and facial posture when I don't think about it but when this is called out, her dyskinesia returns. I reinforced this improvement, reviewing strategies to allow her brain to control her body and speech. Targeted walking and talking with tossing binder clip hand to hand - she was successful for 30 -60 second increments,again reinforcing successful moments as progress. As she is having success at home speaking to family, this week she will make a phone call to a trusted person, focusing on keeping eyes open by scanning the room throughout the call - short call 3-5 minutes twice. She is  to practice humming and prolonged syllables while walking to promote coordination of gait, phonation and some articulation daily at home for 3-5 minutes.   8/6/25BETHA [name] reports she has a letter allowing her to do PT with elevated blood pressure - she does some mindfulness but this is not helping with symptoms. Utilized cutting out aphasia ID cards which did suppress her dystonia and dyskinesia while she was cutting - when she started talking and stopped cutting, mild dyskinesia returned. Targeted sustaining eyes open with hum/sustained phonation for average of 10 seconds over 4 trials, progressed to sustained vowels /I/, /u/for average of 6 seconds with eyes open 3 trials for each vowel. Progressed to cv syllables ma, may, me , my sustaining both phonemes with eyes open 5/5 trials each. Combined nasal syllables to combine 3  - repeating 3 syllalbes 3x with eyes open and occasional min verbal cues. Pt then initiated trial of taking 4 turns in conversation with eyes open for short 5-7 word utterances with occasional min A. Of note, dyskinesias reduced when focus on eyes open during non speech and speech tasks. HEP provided of tasks completed today.   08/24/23: Completed PROM - see above. Lasonia reports success with SOVTE. Completed parts of SOVTE with instruction to watch the clock to time 10 seconds in duration to train eyes open with phonation. Cues to complete accents, 1 per second watching the clock for eyes open. Initiated flow phonation stretch syllables watching amplitude of tissue displacement with airflow during phonation with syllables who, shoe, sue, phrases - she required occasional cues to maintain eyes open for visual feed back of tissue. Flow phrases focusing on  gently exaggerating flow phonemes to reduce tense pressed voice with rare min A and visual cues of flow sounds highlighted. After exercises, Elizabet conversed with eyes open 70% of utterances discussing her family and talking while showing  me texts on her phone with success. Add stretch flow phonation with tissue to HEP.   08/17/23: Evaluation completed - Initiated HEP for muscle tension dysphonia - Trained in gargle 2 minutes prior to HEP. She completed Semi-occluded Vocal Tract Exercises (SOVTE) in water with straw - 10 second hum completed 7x with occasional min verbal cues and modeling. Pitch glides and accents completed with verbal cues and modeling. Song with usual mod A - Instructed her to watch clock  and time 10 seconds with hum to successfully facilitate eyes open with phonation. Education re: tension in throat results in pressed voice.     PATIENT EDUCATION: Education details: HEP, pressed v s flow voice, mindfulness Person educated: Patient Education method: Explanation, Demonstration, and Verbal cues Education comprehension: verbalized understanding, returned demonstration, verbal cues required, and needs further education  HOME EXERCISE PROGRAM: Gargle, SOVTE in water  GOALS: Goals reviewed with patient? Yes  SHORT TERM GOALS: Target date: 09/20/13  Pt will complete HEP with rare min A Baseline: Goal status: NOT MET  2.  Pt will achieve clear phonation in structured speech exercises (resonant and flow) Baseline:  Goal status: NOT MET  3.  Pt will speak with eyes open in structured speech tasks Baseline:  Goal status: NOT MET  4.  Pt will ID 2 triggers for tension dysphonia Baseline:  Goal status: NOT MET  5.  Pt will maintain clear phonation for 3 short turns in conversation with occasional min A Baseline:  Goal status: NOT MET    LONG TERM GOALS: Target date: 10/26/23  Pt will complete HEP for speech/voice with mod I Baseline:  Goal status: ONGOING  2.  Pt will maintain clear phonation and eyes open over 3 minute simple converseation Baseline:  Goal status: NOT MET  3.  Pt will reduce  extraneous oral, neck and body movements during 5 minute conversation by 50% subjectively Baseline:   Goal status: NOT MET  4.  Pt will improve score on PROM Baseline:  Goal status: NOT MET   ASSESSMENT:  CLINICAL IMPRESSION: Patient is a 53 y.o. 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 She presents with excess tensio and extraneous movements of body while speaking. She reports she is unable to keep her eyes open when she talks. She is able to speak over the phone better because she keeps her eyes closed and doesn't feel pressure to use eye contact.  Dyskinesias of face and body noted throughout sessions. She was able to keep eyes open with phonation in HEP training. Speech dysfluent apporximately 25% of utterances with aytpical stutter and short 1-2 second blocks. With ongoing cueing, SOVTE (semi-occluded vocal tract exercises) she is able to achieve clear phonation at and eyes open as syllable level. At this time, I recommend d/c ST due to lack of progress. Pt is in agreement.     OBJECTIVE IMPAIRMENTS: include voice disorder and speech disorder. These impairments are limiting patient from return to work and effectively communicating at home and in community. Factors affecting potential to achieve goals and functional outcome are co-morbidities.. Patient will benefit from skilled SLP services to address above impairments and improve overall function.  REHAB POTENTIAL: Good  PLAN:  SLP FREQUENCY: 1-2x/week  SLP DURATION: 10 weeks  PLANNED INTERVENTIONS: Environmental controls, Cueing hierachy, Cognitive reorganization, Internal/external aids, Functional tasks, Multimodal communication approach, SLP instruction and feedback, Compensatory strategies, Patient/family education, [phone] Treatment of speech (30 or 45 min) , and [id number]- Speech Eval Sound Prod, Articulate, Phonological    Lulia Schriner, Leita Caldron, CCC-SLP 01/02/2024, 3:31 PM   SPEECH THERAPY DISCHARGE SUMMARY  Visits from Start of Care: 8  Current functional level related to goals / functional  outcomes: See goals above   Remaining deficits: Functional motor speech disorder   Education / Equipment: HEP , compensations   Patient agrees to discharge. Patient goals were not met. Patient is being discharged due to maximized rehab potential. .

## 2024-01-04 ENCOUNTER — Ambulatory Visit: Admitting: Physical Therapy

## 2024-01-04 DIAGNOSIS — G4700 Insomnia, unspecified: Secondary | ICD-10-CM | POA: Diagnosis not present

## 2024-01-04 DIAGNOSIS — F447 Conversion disorder with mixed symptom presentation: Secondary | ICD-10-CM | POA: Diagnosis not present

## 2024-01-10 ENCOUNTER — Ambulatory Visit: Admitting: Rehabilitation

## 2024-01-10 DIAGNOSIS — R2689 Other abnormalities of gait and mobility: Secondary | ICD-10-CM | POA: Insufficient documentation

## 2024-01-10 DIAGNOSIS — R258 Other abnormal involuntary movements: Secondary | ICD-10-CM | POA: Insufficient documentation

## 2024-01-10 DIAGNOSIS — R2681 Unsteadiness on feet: Secondary | ICD-10-CM | POA: Insufficient documentation

## 2024-01-10 NOTE — Therapy (Signed)
 OUTPATIENT PHYSICAL THERAPY NEURO TREATMENT - RECERT   Patient Name: Lauren Levy MRN: [id number] DOB:Jul 03, 1970, 53 y.o., female Today's Date: 01/10/2024   PCP: Vernon Velna SAUNDERS, MD REFERRING PROVIDER: Hans Cinderella Duck, MD/ recert sent to PCP  END OF SESSION:  PT End of Session - 01/10/24 0827     Visit Number 31    Number of Visits 44    Date for Recertification  02/27/24   recert   Authorization Type [employer]    Authorization Time Period VL:MN    PT Start Time 1137    PT Stop Time 1225    PT Time Calculation (min) 48 min    Equipment Utilized During Treatment Gait belt    Activity Tolerance Patient tolerated treatment well    Behavior During Therapy WFL for tasks assessed/performed                Past Medical History:  Diagnosis Date   Ovarian cyst    Past Surgical History:  Procedure Laterality Date   BREAST BIOPSY Right 02/16/2021   papilloma   BREAST LUMPECTOMY Right 04/14/2021   BREAST LUMPECTOMY WITH RADIOACTIVE SEED LOCALIZATION Right 04/14/2021   Procedure: RIGHT BREAST LUMPECTOMY WITH RADIOACTIVE SEED LOCALIZATION;  Surgeon: Vanderbilt Ned, MD;  Location: [facility];  Service: General;  Laterality: Right;   KNEE SURGERY Right    MINOR CARPAL TUNNEL Right    Patient Active Problem List   Diagnosis Date Noted   Anxiety 01/12/2022   Foot pain, right 01/12/2022   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   Dystonia 06/03/2021   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  Pt reports feeling well today.  No pain, is going to see Neuropsychology at [facility] so taking a break with SLP for now.    Pt accompanied by: self  PERTINENT HISTORY: PMH: anxiety, focal dystonia  PAIN:  Are you having pain? No  PRECAUTIONS: None  PATIENT GOALS: see if we can help me talk without closing my eyes, strengthen my back so that I can sit like normal people (slides out of chair  when sitting normally), help with my walking                                                                                                                               TREATMENT   Patient seen for aquatic therapy today.  Treatment took place in water 3.6-4.8 feet deep depending upon activity.  Pt entered and exited the pool via stairs using B rails for support.  Pool temp approx 92 deg.    Warm Up:  Walking forwards x approx 18' x 4 laps, backwards walking x 4 laps, all without support then  side stepping with yellow bar bells into and out of water with abd/add x 4 more laps.     Forward marching with yellow barbells for support x 2 laps, adding in alt arm motions x 2 more laps without assist from PT today.  Standing hip circles x 10 reps clockwise and 10 reps CCW with intermittent light support, cues for improved proximal mm activation on stance leg.  Standing hip flex/abd/ext (with knee ext) x 10 reps each side with light intermittent support from wall.  Note improved balance on LLE, still needs some support when on RLE.  Pt needing more external support today due to overt trunk movements.  Sit<>stand from 3rd step with feet on 1st  x 10 reps reaching forward, with min cues to not over extend trunk when fully upright.     Sitting on large white noodle saddle style maintaining balance x 20-30 secs (x 3 sets) then alternating leg kicks x 10 reps>bicycling across pool x 4 laps.  Pt reporting increased leg fatigue at  end of 2nd lap.  Allowed rest breaks as needed throughout. Plank on yellow noodle however she is unable to coordinate movements initially so had her perform plank on small bench in pool, performing alt leg ext x 10 reps then move back to noodle with PT providing light assist at pelvis to ensure technique and position, moving noodle up/down slowly for lower abdominal activation.  She is able to do several correctly but fatigues quickly.    Attempted muscle energy technique in pool with L leg pushing into half wall of pool while performing resisted R hip ext from PT x 3 reps of 5-6 secs holds.  Educated and demonstrated how to perform with broom stick or cane at home.  Pt may need a demo on land to ensure correct technique.     Ended with forward walking x 2 laps without support providing cues to only flex leg enough to clear. She does well but reports this fatigues leg.      Pt requires buoyancy of water for support for reduced fall risk and for unloading/reduced stress on joints as pt able to tolerate increased standing and ambulation in water compared to that on land; viscosity of water is needed for resistance for strengthening and current of water provides perturbations for challenge for balance training  PATIENT EDUCATION: Education details: aquatic rationale  Education method: Solicitor, and Handouts Education comprehension: verbalized understanding, returned demonstration, and needs further education  HOME EXERCISE PROGRAM: -sit at dinning table for 1 meal with upright posture picturing self in mirror  Start with trying to sit upright x 15 sec without changing position, add 15 sec as this gets easier -work on sit to stands in front of full-length mirror at home, keep eyes open Add in counting up by 1's as this gets easier  -sidelying quad stretch 3 x 30 sec each B (verbally added 6/30, pt declines handout)  Access Code: [email or web address] URL:  [email or web address] Date: 08/17/2023 Prepared by: Waddell Southgate  Exercises - Supine Bridge  - 1 x daily - 7 x weekly - 3 sets - 10 reps - Supine Quadricep Sets  - 1 x daily - 7 x weekly - 3 sets - 10 reps - Seated Hip Adduction Isometrics with Ball  - 1 x daily - 7 x weekly - 3 sets - 10 reps - 5 sec hold - Sidelying ITB Stretch off Table  - 1 x daily - 7 x weekly - 3 sets - 30s hold - Supine ITB Stretch with Strap  - 1 x daily - 7 x weekly - 3 sets - 30s hold - Supine 90/90 Alternating Toe Touch  - 1 x daily - 7 x weekly - 3 sets - 10 reps - Tall Kneeling Posterior Pelvic Tilt  - 1 x daily - 7 x weekly - 3 sets - 10 reps   GOALS: Goals reviewed with patient? 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  Pt will be independent with final land and aquatic HEPs for improved strength, balance, transfers and gait. Baseline: in progress Goal status: IN PROGRESS  3.  Pt will improve gait speed to >/= 1.0 m/s to demonstrate improved community ambulation  Baseline: .19m/s, 0.84 m/s  Goal status: NEW      ASSESSMENT:  CLINICAL IMPRESSION: Continue to emphasize single leg balance/staggered stance balance in sessions.  Pt with more overt truncal movements today so transitioned to more core strengthening towards end of session which she tolerated well.  Noting improved functional movement when navigating into and out of pool.      OBJECTIVE IMPAIRMENTS: Abnormal gait, cardiopulmonary status limiting activity, decreased activity tolerance, decreased balance,  decreased coordination, decreased endurance, decreased knowledge of condition, decreased knowledge of use of DME, decreased mobility, difficulty walking, impaired perceived functional ability, impaired sensation, improper body mechanics, and postural dysfunction.   ACTIVITY LIMITATIONS: carrying, lifting, bending, sitting, standing, squatting, sleeping, stairs, transfers, and bed mobility  PARTICIPATION LIMITATIONS: meal prep, cleaning, laundry, interpersonal relationship, driving, shopping, community activity, and occupation  PERSONAL FACTORS: Time since onset of injury/illness/exacerbation and 1-2 comorbidities: anxiety and focal dystonia are also affecting patient's functional outcome.   REHAB POTENTIAL: Good  CLINICAL DECISION MAKING: Evolving/moderate complexity  EVALUATION COMPLEXITY: High  PLAN:  PT FREQUENCY: 2x/week 2x a week (re-cert)  PT DURATION: 6 weeks 8 weeks (re-cert) + 8 weeks (recert)  PLANNED INTERVENTIONS: 02835- PT  Re-evaluation, 97750- Physical Performance Testing, 97110-Therapeutic exercises, 97530- Therapeutic activity, [id number]- Neuromuscular re-education, [phone]- Self Care, 02859- Manual therapy, [phone]- Gait training, [phone]- Aquatic Therapy, [phone]- Electrical stimulation (manual), [phone] (1-2 muscles), [id number] (3+ muscles)- Dry Needling, Patient/Family education, Balance training, Stair training, Taping, Joint mobilization, Spinal mobilization, DME instructions, Cryotherapy, and Moist heat  PLAN FOR NEXT SESSION: assess seated BP, quadruped and tall kneel to work on proximal stability, hip abd strength, work on core strengthening, resisted EVA walker? Lumbo-pelvic dissociation, carrying a distracting object, response to TPDN?, QL and hip stretching to address LLD, pelvic mobility with resistance band with gait or in tall or half kneel   Aquatic: needs to work on progressing toward normalizing movement, needs to work on core control, but also lumbo-pelvic dissociation/  rotational movements  Damien Fought, PT, MPT [facility] [address] [place], [place], [address] Phone: [phone]   Fax:  [phone] 01/10/24, 12:29 PM      01/10/2024, 12:29 PM

## 2024-01-11 ENCOUNTER — Ambulatory Visit

## 2024-01-11 DIAGNOSIS — R2681 Unsteadiness on feet: Secondary | ICD-10-CM

## 2024-01-11 DIAGNOSIS — R258 Other abnormal involuntary movements: Secondary | ICD-10-CM

## 2024-01-11 DIAGNOSIS — R2689 Other abnormalities of gait and mobility: Secondary | ICD-10-CM

## 2024-01-11 NOTE — Therapy (Signed)
 OUTPATIENT PHYSICAL THERAPY NEURO TREATMENT    Patient Name: Lauren Levy MRN: [id number] DOB:September 28, 1970, 53 y.o., female Today's Date: 01/11/2024   PCP: Vernon Velna SAUNDERS, MD REFERRING PROVIDER: Hans Cinderella Duck, MD/ recert sent to PCP  END OF SESSION:  PT End of Session - 01/11/24 1111     Visit Number 32    Number of Visits 44    Date for Recertification  02/27/24    Authorization Type Aetna    Authorization Time Period VL:MN    PT Start Time 1110   patient late   PT Stop Time 1145    PT Time Calculation (min) 35 min    Activity Tolerance Patient tolerated treatment well    Behavior During Therapy WFL for tasks assessed/performed          Past Medical History:  Diagnosis Date   Ovarian cyst    Past Surgical History:  Procedure Laterality Date   BREAST BIOPSY Right 02/16/2021   papilloma   BREAST LUMPECTOMY Right 04/14/2021   BREAST LUMPECTOMY WITH RADIOACTIVE SEED LOCALIZATION Right 04/14/2021   Procedure: RIGHT BREAST LUMPECTOMY WITH RADIOACTIVE SEED LOCALIZATION;  Surgeon: Vanderbilt Ned, MD;  Location: [facility];  Service: General;  Laterality: Right;   KNEE SURGERY Right    MINOR CARPAL TUNNEL Right    Patient Active Problem List   Diagnosis Date Noted   Anxiety 01/12/2022   Foot pain, right 01/12/2022   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   Dystonia 06/03/2021   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 Has neuropsych appt with [facility] some time in January. Requests to work on back strength. Denies falls.   Pt accompanied by: self  PERTINENT HISTORY: PMH: anxiety, focal dystonia  PAIN:  Are you having pain? No  PRECAUTIONS: None  PATIENT GOALS: see if we can help me talk without closing my eyes, strengthen my back so that I can sit like normal people (slides out of chair when sitting normally), help with my walking  TREATMENT  Theract: -supine SKTC  -supine DKTC   - + gentle lateral rocking  -prone assessment of B QL with noted significant tone in R QL, likely contributing to functional leg length discrepancy  -seated forward fold for B paraspinal stretch   NMR: -quadruped bird dog x6  -seated on physioball with emphasis on neutral pelvic posture  -alternating single shoulder flexion  -alternating single hip flexion   -modified bird dog  -standing ball bounce to self   -progressed to alternating hands   -progressed to ball bounce to PT   -progressed to dual cog task naming animals    PATIENT EDUCATION: Education details: strengthening vs stretching needs, continue HEP  Education method: Explanation and Demonstration Education comprehension: verbalized understanding, returned demonstration, and needs further education  HOME EXERCISE PROGRAM: -sit at dinning table for 1 meal with upright posture picturing self in mirror  Start with trying to sit upright x 15 sec without changing position, add 15 sec as this gets easier -work on sit to stands in front of full-length mirror at home, keep eyes open Add in counting up by 1's as this gets easier  -sidelying quad stretch 3 x 30 sec each B (verbally added 6/30, pt declines handout)  Access Code: [email or web address] URL:  [email or web address] Date: 08/17/2023 Prepared by: Waddell Southgate  Exercises - Supine Bridge  - 1 x daily - 7 x weekly - 3 sets - 10 reps - Supine Quadricep Sets  - 1 x daily - 7 x weekly - 3 sets - 10 reps - Seated Hip Adduction Isometrics with Ball  - 1 x daily - 7 x weekly - 3 sets - 10 reps - 5 sec hold - Sidelying ITB Stretch off Table  - 1 x daily - 7 x weekly - 3 sets - 30s hold - Supine ITB Stretch with Strap  - 1 x daily - 7 x weekly - 3 sets - 30s hold - Supine 90/90 Alternating Toe Touch  - 1 x daily - 7 x weekly - 3 sets - 10 reps - Tall Kneeling Posterior Pelvic Tilt  - 1 x daily - 7 x weekly - 3 sets - 10 reps   GOALS: Goals reviewed with patient? Yes   NEW SHORT TERM GOALS: 12/09/23  1. Pt will be independent with updated HEP for improved functional strength and endurance    Baseline: to be updated   Goal status: NEW   2. Patient will ambulate >/= 422ft during with LRAD ModI for improved cardiovascular endurance and BLE strength    Baseline: 244ft no AD, ModI    Goal status: NEW   3. Pt will improve gait speed to >/= .64m/s to demonstrate improved community ambulation   Baseline: .42m/s   Goal status: NEW   NEW LONG TERM GOALS: 01/06/24  1. Patient will demonstrate a good understanding of her diagnosis (FND) and resources available to her    Baseline: benefits from further education, will discuss referral to neuropsych with [facility] on 11/25   Goal status: IN PROGRESS   2. Pt will be independent with final aquatic HEP for improved trunk control and return to community exercise Baseline: to be provided, ongoing Goal status: IN PROGRESS  3. Pt will improve gait speed to >/= .56m/s to demonstrate improved community ambulation  Baseline: .29m/s, 0.84 m/s  Goal status: MET  4. Patient will ambulate >/= 564ft during with LRAD and ModI to demonstrate improved cardiovascular endurance and functional LE strength  Baseline: 238ft, 178 ft  (11/24)   Goal status: IN PROGRESS   NEW SHORT TERM GOALS:   Target date: 01/30/2024   Pt will report having a referral to neuropsychology to address her FND Baseline: will discuss referral to neuropsych with [facility] on 11/25 Goal status: INITIAL   NEW LONG TERM GOALS:  Target date: 02/27/2024   Pt will report working with neuropsychology on a consistent basis to address her FND Baseline: will discuss referral to neuropsych with [facility] on 11/25 Goal status: INITIAL  2.  Pt will be independent with final land and aquatic HEPs for improved strength, balance, transfers and gait. Baseline: in progress Goal status: IN PROGRESS  3.  Pt will improve gait speed to >/= 1.0 m/s to demonstrate improved community ambulation  Baseline: .83m/s, 0.84 m/s  Goal status: NEW      ASSESSMENT:  CLINICAL IMPRESSION: Patient seen for skilled PT session with emphasis on normalizing movement patterns. Patient noting she feels as though her back is weak resulting in a posterior pelvic tilt. PT inclined to think this is more of a functional movement pattern as B QL (R>L) tend to be extremely hypertonic. She did respond fairly well to stretching exercises. When presented with a single dual task (motor), patient was able to demonstrate normalized posture and movement. Once presented with an additional dual task (cog), patients posture and general function did deteriorate. Continue POC.   OBJECTIVE IMPAIRMENTS: Abnormal gait, cardiopulmonary status limiting activity, decreased activity tolerance, decreased balance, decreased coordination, decreased endurance, decreased knowledge of condition, decreased knowledge of use of DME, decreased mobility, difficulty walking, impaired perceived functional ability, impaired sensation, improper body mechanics, and postural dysfunction.   ACTIVITY LIMITATIONS: carrying, lifting, bending, sitting, standing, squatting, sleeping, stairs, transfers, and bed  mobility  PARTICIPATION LIMITATIONS: meal prep, cleaning, laundry, interpersonal relationship, driving, shopping, community activity, and occupation  PERSONAL FACTORS: Time since onset of injury/illness/exacerbation and 1-2 comorbidities: anxiety and focal dystonia are also affecting patient's functional outcome.   REHAB POTENTIAL: Good  CLINICAL DECISION MAKING: Evolving/moderate complexity  EVALUATION COMPLEXITY: High  PLAN:  PT FREQUENCY: 2x/week 2x a week (re-cert)  PT DURATION: 6 weeks 8 weeks (re-cert) + 8 weeks (recert)  PLANNED INTERVENTIONS: 02835- PT Re-evaluation, 97750- Physical Performance Testing, 97110-Therapeutic exercises, 97530- Therapeutic activity, [id number]- Neuromuscular re-education, 97535- Self Care, 02859- Manual therapy, [id number]- Gait training, [phone]- Aquatic Therapy, [phone]- Electrical stimulation (manual), [phone] (1-2 muscles), [id number] (3+ muscles)- Dry Needling, Patient/Family education, Balance training, Stair training, Taping, Joint mobilization, Spinal mobilization, DME instructions, Cryotherapy, and Moist heat  PLAN FOR NEXT SESSION: assess seated BP, quadruped and tall kneel to work on proximal stability, hip abd strength, work on core strengthening, resisted EVA walker? Lumbo-pelvic dissociation, carrying a distracting object, response to TPDN?, QL and hip stretching to address LLD, pelvic mobility with resistance band with gait or in tall or half kneel   Aquatic: needs to work on progressing toward normalizing movement, needs to work on core control, but also lumbo-pelvic dissociation/ rotational movements   Delon DELENA Pop, PT Delon DELENA Pop, PT, DPT, CBIS, CFVRS  01/11/2024, 11:56 AM

## 2024-01-17 ENCOUNTER — Encounter: Payer: Self-pay | Admitting: Rehabilitation

## 2024-01-17 ENCOUNTER — Ambulatory Visit: Payer: Self-pay | Admitting: Rehabilitation

## 2024-01-17 DIAGNOSIS — R2681 Unsteadiness on feet: Secondary | ICD-10-CM

## 2024-01-17 DIAGNOSIS — R258 Other abnormal involuntary movements: Secondary | ICD-10-CM

## 2024-01-17 DIAGNOSIS — R2689 Other abnormalities of gait and mobility: Secondary | ICD-10-CM

## 2024-01-17 NOTE — Therapy (Signed)
 OUTPATIENT PHYSICAL THERAPY NEURO TREATMENT - RECERT   Patient Name: Lauren Levy MRN: [id number] DOB:1970-08-25, 53 y.o., female Today's Date: 01/17/2024   PCP: Vernon Velna SAUNDERS, MD REFERRING PROVIDER: Hans Cinderella Duck, MD/ recert sent to PCP  END OF SESSION:  PT End of Session - 01/17/24 1243     Visit Number 33    Number of Visits 44    Date for Recertification  02/27/24    Authorization Type Aetna    Authorization Time Period VL:MN    PT Start Time 1147    PT Stop Time 1230    PT Time Calculation (min) 43 min    Equipment Utilized During Treatment Other (comment)   floatation devices as needed for support/safety   Activity Tolerance Patient tolerated treatment well    Behavior During Therapy WFL for tasks assessed/performed                Past Medical History:  Diagnosis Date   Ovarian cyst    Past Surgical History:  Procedure Laterality Date   BREAST BIOPSY Right 02/16/2021   papilloma   BREAST LUMPECTOMY Right 04/14/2021   BREAST LUMPECTOMY WITH RADIOACTIVE SEED LOCALIZATION Right 04/14/2021   Procedure: RIGHT BREAST LUMPECTOMY WITH RADIOACTIVE SEED LOCALIZATION;  Surgeon: Vanderbilt Ned, MD;  Location: [facility];  Service: General;  Laterality: Right;   KNEE SURGERY Right    MINOR CARPAL TUNNEL Right    Patient Active Problem List   Diagnosis Date Noted   Anxiety 01/12/2022   Foot pain, right 01/12/2022   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   Dystonia 06/03/2021   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 Husband did come with her right to door of pool entrance but pt did very well!    Pt accompanied by: self  PERTINENT HISTORY: PMH: anxiety, focal dystonia  PAIN:  Are you having pain? No  PRECAUTIONS: None  PATIENT GOALS: see if we can help me talk without closing my eyes, strengthen my back so that I can sit like normal people (slides out  of chair when sitting normally), help with my walking                                                                                                                               TREATMENT   Patient seen for aquatic therapy today.  Treatment took place in water 3.6-4.8 feet deep depending upon activity.  Pt entered and exited the pool via stairs using B rails for support.  Pool temp approx 92 deg.    Warm Up:  Walking forwards x approx 18' x 4 laps, backwards walking x 4 laps, all without support then  side stepping with yellow bar bells into and out of water with abd/add x 4 more laps.     Forward marching without support x 4 laps, adding in alt arm motions x 2 more laps without assist from PT today.  Standing hip flex/abd/ext (with knee ext) x 10 reps each side with light intermittent support from wall-cues for fingertip touch only.  Note improved balance on LLE, still needs some support when on RLE.  Pt needing more cuing today due to overt trunk movements.  Sit<>stand from 3rd step with feet on 1st  x 10 reps reaching forward, with min cues to not over extend trunk when fully upright.    Core strength:  Plank position on bench performing fire hydrant x 10 reps on each side with heavy facilitation to avoid trunk rotation, alt UE/LE extension (birddog) x 10 reps with heavy cues and light facilitation at trunk to maintain core active.  Standing with feet narrowed holding green hand bells moving  in alt shoulder flex/ext x 3 rounds of 20 secs with cues for avoiding locking knees out and decreasing trunk rotation.  Seated on pool bench with back unsupported, without UE support, bicycling legs x 2 sets of 20 reps.  Pt needing cues often for ant pelvic tilt as she tends to stay in post tilt throughout.    Quick alt step jumps in 3'6 water to small step and light single UE support>no UE support x 20 reps.  B jump to step and back to floor x 20 reps with light support from wall.  Quick lateral step up/down with light support from wall x 20 reps.        Pt requires buoyancy of water for support for reduced fall risk and for unloading/reduced stress on joints as pt able to tolerate increased standing and ambulation in water compared to that on land; viscosity of water is needed for resistance for strengthening and current of water provides perturbations for challenge for balance training       PATIENT EDUCATION: Education details: aquatic rationale  Education method: Explanation, Demonstration, and Handouts Education comprehension: verbalized understanding, returned demonstration, and needs further education  HOME EXERCISE PROGRAM: -sit at dinning table for 1 meal with upright posture picturing self in mirror  Start with trying to sit upright x 15 sec without changing  position, add 15 sec as this gets easier -work on sit to stands in front of full-length mirror at home, keep eyes open Add in counting up by 1's as this gets easier  -sidelying quad stretch 3 x 30 sec each B (verbally added 6/30, pt declines handout)  Access Code: [email or web address] URL: [email or web address] Date: 08/17/2023 Prepared by: Waddell Southgate  Exercises - Supine Bridge  - 1 x daily - 7 x weekly - 3 sets - 10 reps - Supine Quadricep Sets  - 1 x daily - 7 x weekly - 3 sets - 10 reps - Seated Hip Adduction Isometrics with Ball  - 1 x daily - 7 x weekly - 3 sets - 10 reps - 5 sec hold - Sidelying ITB  Stretch off Table  - 1 x daily - 7 x weekly - 3 sets - 30s hold - Supine ITB Stretch with Strap  - 1 x daily - 7 x weekly - 3 sets - 30s hold - Supine 90/90 Alternating Toe Touch  - 1 x daily - 7 x weekly - 3 sets - 10 reps - Tall Kneeling Posterior Pelvic Tilt  - 1 x daily - 7 x weekly - 3 sets - 10 reps   GOALS: Goals reviewed with patient? 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  Pt will be independent with final land and aquatic HEPs for improved strength, balance, transfers and gait. Baseline: in progress Goal status: IN PROGRESS  3.  Pt will improve gait speed to >/= 1.0 m/s to demonstrate improved community ambulation  Baseline: .40m/s, 0.84 m/s  Goal status: NEW      ASSESSMENT:  CLINICAL IMPRESSION: Continue to emphasize single leg balance in session with less external support today.  Needs no support during marching, but still light tactile support for SLS tasks.  Incorporated more core activation as well as coordination tasks today.  She tolerated well.  Also note that she ambulated to/from pool area without device today.  Husband with her for safety but she did very well!    OBJECTIVE IMPAIRMENTS: Abnormal gait, cardiopulmonary status limiting activity, decreased activity tolerance, decreased balance, decreased coordination, decreased endurance, decreased knowledge of condition, decreased knowledge of use of DME, decreased mobility, difficulty walking, impaired perceived functional ability, impaired sensation, improper body mechanics, and postural  dysfunction.   ACTIVITY LIMITATIONS: carrying, lifting, bending, sitting, standing, squatting, sleeping, stairs, transfers, and bed mobility  PARTICIPATION LIMITATIONS: meal prep, cleaning, laundry, interpersonal relationship, driving, shopping, community activity, and occupation  PERSONAL FACTORS: Time since onset of injury/illness/exacerbation and 1-2 comorbidities: anxiety and focal dystonia are also affecting patient's functional outcome.   REHAB POTENTIAL: Good  CLINICAL DECISION MAKING: Evolving/moderate complexity  EVALUATION COMPLEXITY: High  PLAN:  PT FREQUENCY: 2x/week 2x a week (re-cert)  PT DURATION: 6 weeks 8 weeks (re-cert) + 8 weeks (recert)  PLANNED INTERVENTIONS: 02835- PT Re-evaluation, 97750- Physical Performance Testing, 97110-Therapeutic exercises, 97530- Therapeutic activity, [id number]- Neuromuscular re-education, 97535- Self Care, 02859- Manual therapy, [id number]- Gait training, [id number]- Aquatic Therapy, [id number]- Electrical stimulation (manual), [id number] (1-2 muscles), 20561 (3+  muscles)- Dry Needling, Patient/Family education, Balance training, Stair training, Taping, Joint mobilization, Spinal mobilization, DME instructions, Cryotherapy, and Moist heat  PLAN FOR NEXT SESSION: assess seated BP, quadruped and tall kneel to work on proximal stability, hip abd strength, work on core strengthening, resisted EVA walker? Lumbo-pelvic dissociation, carrying a distracting object, response to TPDN?, QL and hip stretching to address LLD, pelvic mobility with resistance band with gait or in tall or half kneel   Aquatic: needs to work on progressing toward normalizing movement, needs to work on core control, but also lumbo-pelvic dissociation/ rotational movements  Damien Fought, PT, MPT [facility] [address] [place], [place], [address] Phone: [phone]   Fax:  [phone] 01/17/24, 12:44 PM

## 2024-01-18 ENCOUNTER — Ambulatory Visit: Payer: Self-pay

## 2024-01-24 ENCOUNTER — Ambulatory Visit: Payer: Self-pay | Admitting: Rehabilitation

## 2024-01-26 ENCOUNTER — Ambulatory Visit: Payer: Self-pay | Admitting: Physical Therapy

## 2024-01-26 DIAGNOSIS — R2689 Other abnormalities of gait and mobility: Secondary | ICD-10-CM

## 2024-01-26 DIAGNOSIS — R2681 Unsteadiness on feet: Secondary | ICD-10-CM | POA: Diagnosis not present

## 2024-01-26 NOTE — Therapy (Signed)
 OUTPATIENT PHYSICAL THERAPY NEURO TREATMENT   Patient Name: Lauren Levy MRN: [id number] DOB:Aug 12, 1970, 53 y.o., female Today's Date: 01/26/2024   PCP: Vernon Velna SAUNDERS, MD REFERRING PROVIDER: Hans Cinderella Duck, MD/ recert sent to PCP  END OF SESSION:  PT End of Session - 01/26/24 1150     Visit Number 34    Number of Visits 44    Date for Recertification  02/27/24    Authorization Type [employer]    Authorization Time Period [id number]    PT Start Time 1150   pt arrived late   PT Stop Time 1225   pt asked to leave early for an appointment   PT Time Calculation (min) 35 min    Equipment Utilized During Treatment Gait belt    Activity Tolerance Patient tolerated treatment well    Behavior During Therapy WFL for tasks assessed/performed                 Past Medical History:  Diagnosis Date   Ovarian cyst    Past Surgical History:  Procedure Laterality Date   BREAST BIOPSY Right 02/16/2021   papilloma   BREAST LUMPECTOMY Right 04/14/2021   BREAST LUMPECTOMY WITH RADIOACTIVE SEED LOCALIZATION Right 04/14/2021   Procedure: RIGHT BREAST LUMPECTOMY WITH RADIOACTIVE SEED LOCALIZATION;  Surgeon: Vanderbilt Ned, MD;  Location: [facility];  Service: General;  Laterality: Right;   KNEE SURGERY Right    MINOR CARPAL TUNNEL Right    Patient Active Problem List   Diagnosis Date Noted   Anxiety 01/12/2022   Foot pain, right 01/12/2022   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   Dystonia 06/03/2021   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   Pt does have an appointment with psychotherapy through [facility] in Jan, unsure of exact date. She reports that her mobility is a little better, grimacing is about the same; has discomfort from her back feeling really weak.  Pt accompanied by: self  PERTINENT HISTORY: PMH: anxiety, focal dystonia  PAIN:  Are you having pain? No  PRECAUTIONS: None  PATIENT GOALS: see if we can help me talk without closing my eyes, strengthen my back so that I can sit like normal people (slides out of chair when sitting normally), help with my walking                                                                                                                               TREATMENT   TherAct Supine SKTC 3 x 30 sec each B More difficulty stretching L side as compared to R side due to spasms in back muscles Supine LTR x 10 reps B with 10-15 sec hold Pt tolerates this better than SKTC Seated anteior leans on blue Swiss ball while seated on green dynadisc X 3 reps, pt with fair tolerance for this due to her back spasming Child's pose stretch 3 x 30 sec Seated QL stretch 3 x 30 sec each B  NMR To work on core strengthening/stability: Quadruped on peanut ball alt LE kicks 3 x 5 reps to fatigue Seated on dynadisc performing 2# weighted dowel chest press Attempted with 7#, too heavy Focus on core stability, difficult to maintain upright trunk and not lean forwards      PATIENT EDUCATION: Education details: continue HEP, added visits through end of Jan then will reassess POC Education method: Explanation and Demonstration Education comprehension: verbalized understanding, returned demonstration, and needs further education  HOME EXERCISE PROGRAM: -sit at dinning table for 1 meal with upright posture picturing self in mirror  Start with trying to sit  upright x 15 sec without changing position, add 15 sec as this gets easier -work on sit to stands in front of full-length mirror at home, keep eyes open Add in counting up by 1's as this gets easier  -sidelying quad stretch 3 x 30 sec each B (verbally added 6/30, pt declines handout)  Access Code: [email or web address] URL: [email or web address] Date: 08/17/2023 Prepared by: Waddell Southgate  Exercises - Supine Bridge  - 1 x daily - 7 x weekly - 3 sets - 10 reps - Supine Quadricep Sets  - 1 x daily - 7 x weekly - 3 sets - 10 reps - Seated Hip Adduction Isometrics with Ball  - 1 x daily - 7 x weekly - 3 sets - 10 reps - 5 sec hold - Sidelying ITB Stretch off Table  - 1 x daily - 7 x weekly - 3 sets - 30s hold - Supine ITB Stretch with Strap  - 1 x daily - 7 x weekly - 3 sets - 30s hold - Supine 90/90 Alternating Toe Touch  - 1 x daily - 7 x weekly - 3 sets - 10 reps - Tall Kneeling Posterior Pelvic Tilt  - 1 x daily - 7 x weekly - 3 sets - 10 reps   GOALS: Goals reviewed with patient? Yes   NEW SHORT TERM GOALS:   Target date: 01/30/2024   Pt will report having a referral to neuropsychology  to address her FND Baseline: will discuss referral to neuropsych with [facility] on 11/25, has referral and scheduled to see neuropsych in Jan 2026 (12/18) Goal status: MET   NEW LONG TERM GOALS:  Target date: 02/27/2024   Pt will report working with neuropsychology on a consistent basis to address her FND Baseline: will discuss referral to neuropsych with [facility] on 11/25 Goal status: INITIAL  2.  Pt will be independent with final land and aquatic HEPs for improved strength, balance, transfers and gait. Baseline: in progress Goal status: IN PROGRESS  3.  Pt will improve gait speed to >/= 1.0 m/s to demonstrate improved community ambulation  Baseline: .42m/s, 0.84 m/s  Goal status: NEW      ASSESSMENT:  CLINICAL IMPRESSION: Emphasis of skilled PT session on assessing  STG and continuing to work on stretching of tight musculature in her core and low back region as well as working on core strengthening and stabilization in attempts to normalize her movement patterns. She has met 1/1 STG as she has a referral to neuropsychology and is scheduled to see them in Jan of 2026. She continues to exhibit increased activity of her lower back muscles leading to uncontrolled ongoing movements and difficulty sitting still. She continues to benefit from skilled PT services in attempts to normalize her movement patterns in the context of FND diagnosis. Continue POC.     OBJECTIVE IMPAIRMENTS: Abnormal gait, cardiopulmonary status limiting activity, decreased activity tolerance, decreased balance, decreased coordination, decreased endurance, decreased knowledge of condition, decreased knowledge of use of DME, decreased mobility, difficulty walking, impaired perceived functional ability, impaired sensation, improper body mechanics, and postural dysfunction.   ACTIVITY LIMITATIONS: carrying, lifting, bending, sitting, standing, squatting, sleeping, stairs, transfers, and bed mobility  PARTICIPATION LIMITATIONS: meal prep, cleaning, laundry, interpersonal relationship, driving, shopping, community activity, and occupation  PERSONAL FACTORS: Time since onset of injury/illness/exacerbation and 1-2 comorbidities: anxiety and focal dystonia are also affecting patient's functional outcome.   REHAB POTENTIAL: Good  CLINICAL DECISION MAKING: Evolving/moderate complexity  EVALUATION COMPLEXITY: High  PLAN:  PT FREQUENCY: 2x/week 2x a week (re-cert)  PT DURATION: 6 weeks 8 weeks (re-cert) + 8 weeks (recert)  PLANNED INTERVENTIONS: 02835- PT Re-evaluation, 97750- Physical Performance Testing, 97110-Therapeutic exercises, 97530- Therapeutic activity, [id number]- Neuromuscular re-education, 97535- Self Care, 02859- Manual therapy, [phone]- Gait training, [phone]- Aquatic Therapy, [phone]  Electrical stimulation (manual), [phone] (1-2 muscles), [phone] (3+ muscles)- Dry Needling, Patient/Family education, Balance training, Stair training, Taping, Joint mobilization, Spinal mobilization, DME instructions, Cryotherapy, and Moist heat  PLAN FOR NEXT SESSION: quadruped and tall kneel to work on proximal stability, hip abd strength, work on core strengthening, carrying a distracting object, QL and hip stretching to address LLD, pelvic mobility with resistance band with gait or in tall or half kneel, lifting weighted boxes  Scheduled through end of Jan, will reassess at that point   Aquatic: needs to work on progressing toward normalizing movement, needs to work on core control, but also lumbo-pelvic dissociation/ rotational movements  Waddell Southgate, PT, DPT, CSRS  01/26/2024, 12:49 PM

## 2024-01-30 ENCOUNTER — Ambulatory Visit: Payer: Self-pay | Admitting: Physical Therapy

## 2024-01-30 NOTE — Therapy (Incomplete)
 " OUTPATIENT PHYSICAL THERAPY NEURO TREATMENT   Patient Name: Lauren Levy MRN: [id number] DOB:01/10/1971, 53 y.o., female Today's Date: 01/30/2024   PCP: Vernon Velna SAUNDERS, MD REFERRING PROVIDER: Hans Cinderella Duck, MD/ recert sent to PCP  END OF SESSION:           Past Medical History:  Diagnosis Date   Ovarian cyst    Past Surgical History:  Procedure Laterality Date   BREAST BIOPSY Right 02/16/2021   papilloma   BREAST LUMPECTOMY Right 04/14/2021   BREAST LUMPECTOMY WITH RADIOACTIVE SEED LOCALIZATION Right 04/14/2021   Procedure: RIGHT BREAST LUMPECTOMY WITH RADIOACTIVE SEED LOCALIZATION;  Surgeon: Vanderbilt Ned, MD;  Location: [facility];  Service: General;  Laterality: Right;   KNEE SURGERY Right    MINOR CARPAL TUNNEL Right    Patient Active Problem List   Diagnosis Date Noted   Anxiety 01/12/2022   Foot pain, right 01/12/2022   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  Rationale for Evaluation and Treatment: Rehabilitation  SUBJECTIVE:                                                                                                                                                                                             SUBJECTIVE STATEMENT:  Pt was able to walk to the pool last visit, didn't need her knee scooter.   Pt does have an appointment with psychotherapy through [facility] in Jan, unsure of exact date. She reports that her mobility is a little better, grimacing is about the same; has discomfort from her back feeling really weak.  ***  Pt accompanied by: self  PERTINENT HISTORY: PMH: anxiety, focal dystonia  PAIN:  Are you having pain? No  PRECAUTIONS: None  PATIENT GOALS: see if we can help me  talk without closing my eyes, strengthen my back so that I can sit like normal people (slides out of chair when sitting normally), help with my walking  TREATMENT   TherAct Supine SKTC 3 x 30 sec each B More difficulty stretching L side as compared to R side due to spasms in back muscles Supine LTR x 10 reps B with 10-15 sec hold Pt tolerates this better than SKTC Seated anteior leans on blue Swiss ball while seated on green dynadisc X 3 reps, pt with fair tolerance for this due to her back spasming Child's pose stretch 3 x 30 sec Seated QL stretch 3 x 30 sec each B ***  NMR To work on core strengthening/stability: Quadruped on peanut ball alt LE kicks 3 x 5 reps to fatigue Seated on dynadisc performing 2# weighted dowel chest press Attempted with 7#, too heavy Focus on core stability, difficult to maintain upright trunk and not lean forwards ***      PATIENT EDUCATION: Education details: continue HEP, added visits through end of Jan then will reassess POC*** Education method: Medical Illustrator Education comprehension: verbalized understanding, returned demonstration, and needs further education  HOME EXERCISE PROGRAM: -sit at dinning table for 1 meal with upright posture picturing self in mirror  Start with trying to sit upright x 15 sec without changing position, add 15 sec as this gets easier -work on sit to stands in front of full-length mirror at home, keep eyes open Add in counting up by 1's as this gets easier  -sidelying quad stretch 3 x 30 sec each B (verbally added 6/30, pt declines handout)  Access Code: [email or web address] URL: [email or web address] Date: 08/17/2023 Prepared by: Waddell Southgate  Exercises - Supine Bridge  - 1 x daily - 7 x weekly - 3 sets - 10 reps - Supine Quadricep Sets  - 1 x daily - 7 x weekly -  3 sets - 10 reps - Seated Hip Adduction Isometrics with Ball  - 1 x daily - 7 x weekly - 3 sets - 10 reps - 5 sec hold - Sidelying ITB Stretch off Table  - 1 x daily - 7 x weekly - 3 sets - 30s hold - Supine ITB Stretch with Strap  - 1 x daily - 7 x weekly - 3 sets - 30s hold - Supine 90/90 Alternating Toe Touch  - 1 x daily - 7 x weekly - 3 sets - 10 reps - Tall Kneeling Posterior Pelvic Tilt  - 1 x daily - 7 x weekly - 3 sets - 10 reps   GOALS: Goals reviewed with patient? Yes   NEW SHORT TERM GOALS:   Target date: 01/30/2024   Pt will report having a referral to neuropsychology to address her FND Baseline: will discuss referral to neuropsych with [facility] on 11/25, has referral and scheduled to see neuropsych in Jan 2026 (12/18) Goal status: MET   NEW LONG TERM GOALS:  Target date: 02/27/2024   Pt will report working with neuropsychology on a consistent basis to address her FND Baseline: will discuss referral to neuropsych with [facility] on 11/25 Goal status: INITIAL  2.  Pt will be independent with final land and aquatic HEPs for improved strength, balance, transfers and gait. Baseline: in progress Goal status: IN PROGRESS  3.  Pt will improve gait speed to >/= 1.0 m/s to demonstrate improved community ambulation  Baseline: .40m/s, 0.84 m/s  Goal status: NEW      ASSESSMENT:  CLINICAL IMPRESSION: Emphasis of skilled PT session on*** assessing STG and continuing to work on stretching of tight musculature in her core and low back region as well as working  on core strengthening and stabilization in attempts to normalize her movement patterns. She has met 1/1 STG as she has a referral to neuropsychology and is scheduled to see them in Jan of 2026. She continues to exhibit increased activity of her lower back muscles leading to uncontrolled ongoing movements and difficulty sitting still. She continues to benefit from skilled PT services in attempts to normalize her  movement patterns in the context of FND diagnosis. Continue POC.     OBJECTIVE IMPAIRMENTS: Abnormal gait, cardiopulmonary status limiting activity, decreased activity tolerance, decreased balance, decreased coordination, decreased endurance, decreased knowledge of condition, decreased knowledge of use of DME, decreased mobility, difficulty walking, impaired perceived functional ability, impaired sensation, improper body mechanics, and postural dysfunction.   ACTIVITY LIMITATIONS: carrying, lifting, bending, sitting, standing, squatting, sleeping, stairs, transfers, and bed mobility  PARTICIPATION LIMITATIONS: meal prep, cleaning, laundry, interpersonal relationship, driving, shopping, community activity, and occupation  PERSONAL FACTORS: Time since onset of injury/illness/exacerbation and 1-2 comorbidities: anxiety and focal dystonia are also affecting patient's functional outcome.   REHAB POTENTIAL: Good  CLINICAL DECISION MAKING: Evolving/moderate complexity  EVALUATION COMPLEXITY: High  PLAN:  PT FREQUENCY: 2x/week 2x a week (re-cert)  PT DURATION: 6 weeks 8 weeks (re-cert) + 8 weeks (recert)  PLANNED INTERVENTIONS: 02835- PT Re-evaluation, 97750- Physical Performance Testing, 97110-Therapeutic exercises, 97530- Therapeutic activity, [id number]- Neuromuscular re-education, 97535- Self Care, 02859- Manual therapy, [phone]- Gait training, [phone]- Aquatic Therapy, [phone]- Electrical stimulation (manual), [phone] (1-2 muscles), 20561 (3+ muscles)- Dry Needling, Patient/Family education, Balance training, Stair training, Taping, Joint mobilization, Spinal mobilization, DME instructions, Cryotherapy, and Moist heat  PLAN FOR NEXT SESSION: quadruped and tall kneel to work on proximal stability, hip abd strength, work on core strengthening, carrying a distracting object, QL and hip stretching to address LLD, pelvic mobility with resistance band with gait or in tall or half kneel, lifting weighted  boxes  Scheduled through end of Jan, will reassess at that point***   Aquatic: needs to work on progressing toward normalizing movement, needs to work on core control, but also lumbo-pelvic dissociation/ rotational movements  Waddell Southgate, PT, DPT, CSRS  01/30/2024, 8:00 AM       "

## 2024-02-01 ENCOUNTER — Ambulatory Visit: Payer: Self-pay | Admitting: Physical Therapy

## 2024-02-06 ENCOUNTER — Ambulatory Visit: Payer: Self-pay | Admitting: Physical Therapy

## 2024-02-06 VITALS — BP 139/101 | HR 84

## 2024-02-06 DIAGNOSIS — R2689 Other abnormalities of gait and mobility: Secondary | ICD-10-CM

## 2024-02-06 DIAGNOSIS — R2681 Unsteadiness on feet: Secondary | ICD-10-CM | POA: Diagnosis not present

## 2024-02-06 DIAGNOSIS — R258 Other abnormal involuntary movements: Secondary | ICD-10-CM

## 2024-02-06 NOTE — Therapy (Signed)
 " OUTPATIENT PHYSICAL THERAPY NEURO TREATMENT   Patient Name: Lauren Levy MRN: [id number] DOB:Mar 20, 1970, 53 y.o., female Today's Date: 02/06/2024   PCP: Vernon Velna SAUNDERS, MD REFERRING PROVIDER: Hans Cinderella Duck, MD/ recert sent to PCP  END OF SESSION:  PT End of Session - 02/06/24 1151     Visit Number 35    Number of Visits 44    Date for Recertification  02/27/24    Authorization Type [employer]    Authorization Time Period VL:MN    PT Start Time 1150   pt arrived late   PT Stop Time 1223   elected to end session early   PT Time Calculation (min) 33 min    Equipment Utilized During Treatment Gait belt    Activity Tolerance Patient limited by fatigue;Other (comment)   onset of headache   Behavior During Therapy WFL for tasks assessed/performed                  Past Medical History:  Diagnosis Date   Ovarian cyst    Past Surgical History:  Procedure Laterality Date   BREAST BIOPSY Right 02/16/2021   papilloma   BREAST LUMPECTOMY Right 04/14/2021   BREAST LUMPECTOMY WITH RADIOACTIVE SEED LOCALIZATION Right 04/14/2021   Procedure: RIGHT BREAST LUMPECTOMY WITH RADIOACTIVE SEED LOCALIZATION;  Surgeon: Vanderbilt Ned, MD;  Location: [facility];  Service: General;  Laterality: Right;   KNEE SURGERY Right    MINOR CARPAL TUNNEL Right    Patient Active Problem List   Diagnosis Date Noted   Anxiety 01/12/2022   Foot pain, right 01/12/2022   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   Dystonia 06/03/2021   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   Gait disturbance 10/09/2020    ONSET DATE: 06/29/2023  REFERRING DIAG: R26.9 (ICD-10-CM) - Unspecified abnormalities of gait and mobility  THERAPY DIAG:  Unsteadiness on feet  Other abnormalities of gait and mobility  Other abnormal involuntary movements  Rationale for Evaluation and Treatment: Rehabilitation  SUBJECTIVE:                                                                                                                                                                                              SUBJECTIVE STATEMENT:  Pt denies any acute changes since last visit, got lazy the week of Christmas and didn't do a whole lot. She continues to use her knee scooter to enter/exit appointments.  Pt accompanied by: self  PERTINENT HISTORY: PMH: anxiety, focal dystonia  PAIN:  Are you having pain? No  PRECAUTIONS: None  PATIENT GOALS: see if we can help me talk without closing my eyes, strengthen  my back so that I can sit like normal people (slides out of chair when sitting normally), help with my walking                                                                                                                               TREATMENT   NMR To work on pelvic mobility and attempting to normalize movement patterns: Gait with red resistance band x 115 ft with steady resistance, 4-5 standing rest breaks due to fatigue in R quads > HS muscles Gait with red resistance band x 115 ft with alt L/R pull at pelvis to resist anterior rotation, pt noted to have increased pelvic rotation with use of resistance band Gait with red resistance band x 755 ft going BACKWARDS with steady resistance, initially with improved gait mechanics but onset of R knee pain - deferred further distance  To work on core strengthening/stability: Gait x 115 ft carrying crate with 10# weight, several standing rest breaks, difficulty due to the walking part; decreased pelvic rotation noted while carrying weight Attempted to carry unweighted crate, step to gait pattern, feels unsteady, asks to stop working on walking  To work on proximal stabilization: In half kneel on red mat on floor Alt L/R 2# dowel chest press 1 x 15 reps B Improved balance on LLE as compared to RLE Tends to extend her trunk with RLE down  Pt then has onset of a headache along with symptoms of sweating and fatigue to the point she feels like she  is going to pass out. Vitals assessed (see below) and WNL for patient. Elected to end session early due to fatigue levels and onset of headache. Will assess response again next visit.   Vitals:   02/06/24 1241  BP: (!) 139/101  Pulse: 84         PATIENT EDUCATION: Education details: continue HEP, go home and rest and drink water Education method: Explanation and Demonstration Education comprehension: verbalized understanding, returned demonstration, and needs further education  HOME EXERCISE PROGRAM: -sit at dinning table for 1 meal with upright posture picturing self in mirror  Start with trying to sit upright x 15 sec without changing position, add 15 sec as this gets easier -work on sit to stands in front of full-length mirror at home, keep eyes open Add in counting up by 1's as this gets easier  -sidelying quad stretch 3 x 30 sec each B (verbally added 6/30, pt declines handout)  Access Code: [email or web address] URL: [email or web address] Date: 08/17/2023 Prepared by: Waddell Southgate  Exercises - Supine Bridge  - 1 x daily - 7 x weekly - 3 sets - 10 reps - Supine Quadricep Sets  - 1 x daily - 7 x weekly - 3 sets - 10 reps - Seated Hip Adduction Isometrics with Ball  - 1 x daily - 7 x weekly - 3 sets - 10 reps - 5 sec hold - Sidelying ITB Stretch off  Table  - 1 x daily - 7 x weekly - 3 sets - 30s hold - Supine ITB Stretch with Strap  - 1 x daily - 7 x weekly - 3 sets - 30s hold - Supine 90/90 Alternating Toe Touch  - 1 x daily - 7 x weekly - 3 sets - 10 reps - Tall Kneeling Posterior Pelvic Tilt  - 1 x daily - 7 x weekly - 3 sets - 10 reps   GOALS: Goals reviewed with patient? Yes   NEW SHORT TERM GOALS:   Target date: 01/30/2024   Pt will report having a referral to neuropsychology to address her FND Baseline: will discuss referral to neuropsych with [facility] on 11/25, has referral and scheduled to see neuropsych in Jan 2026 (12/18) Goal status:  MET   NEW LONG TERM GOALS:  Target date: 02/27/2024   Pt will report working with neuropsychology on a consistent basis to address her FND Baseline: will discuss referral to neuropsych with [facility] on 11/25 Goal status: INITIAL  2.  Pt will be independent with final land and aquatic HEPs for improved strength, balance, transfers and gait. Baseline: in progress Goal status: IN PROGRESS  3.  Pt will improve gait speed to >/= 1.0 m/s to demonstrate improved community ambulation  Baseline: .73m/s, 0.84 m/s  Goal status: NEW      ASSESSMENT:  CLINICAL IMPRESSION: Emphasis of skilled PT session on working on pelvic stabilization, core strengthening/stabilization, and proximal strengthening. Pt with significant difficulty performing ambulation this visit and quickly fatigues with this. Pt also with onset of a headache towards end of session. Her BP remains elevated but this is consistent with previous readings for her. Elected to end session early due to severe fatigue levels and onset of headache. Will assess response again next visit as well. She continues to benefit from skilled PT services in attempts to normalize her movement patterns in the context of FND diagnosis. Continue POC.     OBJECTIVE IMPAIRMENTS: Abnormal gait, cardiopulmonary status limiting activity, decreased activity tolerance, decreased balance, decreased coordination, decreased endurance, decreased knowledge of condition, decreased knowledge of use of DME, decreased mobility, difficulty walking, impaired perceived functional ability, impaired sensation, improper body mechanics, and postural dysfunction.   ACTIVITY LIMITATIONS: carrying, lifting, bending, sitting, standing, squatting, sleeping, stairs, transfers, and bed mobility  PARTICIPATION LIMITATIONS: meal prep, cleaning, laundry, interpersonal relationship, driving, shopping, community activity, and occupation  PERSONAL FACTORS: Time since onset of  injury/illness/exacerbation and 1-2 comorbidities: anxiety and focal dystonia are also affecting patient's functional outcome.   REHAB POTENTIAL: Good  CLINICAL DECISION MAKING: Evolving/moderate complexity  EVALUATION COMPLEXITY: High  PLAN:  PT FREQUENCY: 2x/week 2x a week (re-cert)  PT DURATION: 6 weeks 8 weeks (re-cert) + 8 weeks (recert)  PLANNED INTERVENTIONS: 02835- PT Re-evaluation, 97750- Physical Performance Testing, 97110-Therapeutic exercises, 97530- Therapeutic activity, [id number]- Neuromuscular re-education, 97535- Self Care, 02859- Manual therapy, [phone]- Gait training, [phone]- Aquatic Therapy, [phone]- Electrical stimulation (manual), [phone] (1-2 muscles), [phone] (3+ muscles)- Dry Needling, Patient/Family education, Balance training, Stair training, Taping, Joint mobilization, Spinal mobilization, DME instructions, Cryotherapy, and Moist heat  PLAN FOR NEXT SESSION: how did she feel after visit 12/29? quadruped and tall kneel to work on proximal stability, hip abd strength, work on core strengthening, carrying a distracting object, QL and hip stretching to address LLD, pelvic mobility with resistance band with gait or in tall or half kneel, lifting weighted boxes, seated on dynadisc  Scheduled through end of Jan, will reassess at  that point   Aquatic: needs to work on progressing toward normalizing movement, needs to work on core control, but also lumbo-pelvic dissociation/ rotational movements  Waddell Southgate, PT, DPT, CSRS  02/06/2024, 12:41 PM       "

## 2024-02-08 ENCOUNTER — Ambulatory Visit: Payer: Self-pay | Admitting: Physical Therapy

## 2024-02-08 NOTE — Therapy (Incomplete)
 " OUTPATIENT PHYSICAL THERAPY NEURO TREATMENT   Patient Name: Lauren Levy MRN: [id number] DOB:November 30, 1970, 53 y.o., female Today's Date: 02/08/2024   PCP: Vernon Velna SAUNDERS, MD REFERRING PROVIDER: Hans Cinderella Duck, MD/ recert sent to PCP  END OF SESSION:            Past Medical History:  Diagnosis Date   Ovarian cyst    Past Surgical History:  Procedure Laterality Date   BREAST BIOPSY Right 02/16/2021   papilloma   BREAST LUMPECTOMY Right 04/14/2021   BREAST LUMPECTOMY WITH RADIOACTIVE SEED LOCALIZATION Right 04/14/2021   Procedure: RIGHT BREAST LUMPECTOMY WITH RADIOACTIVE SEED LOCALIZATION;  Surgeon: Vanderbilt Ned, MD;  Location: [facility];  Service: General;  Laterality: Right;   KNEE SURGERY Right    MINOR CARPAL TUNNEL Right    Patient Active Problem List   Diagnosis Date Noted   Anxiety 01/12/2022   Foot pain, right 01/12/2022   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  Rationale for Evaluation and Treatment: Rehabilitation  SUBJECTIVE:                                                                                                                                                                                             SUBJECTIVE STATEMENT:  Pt denies any acute changes since last visit, got lazy the week of Christmas and didn't do a whole lot. She continues to use her knee scooter to enter/exit appointments.  ***  Pt accompanied by: self  PERTINENT HISTORY: PMH: anxiety, focal dystonia  PAIN:  Are you having pain? No  PRECAUTIONS: None  PATIENT GOALS: see if we can help me talk without closing my eyes, strengthen my back so that I can sit like normal people (slides out of chair when sitting  normally), help with my walking  TREATMENT   NMR To work on pelvic mobility and attempting to normalize movement patterns: Gait with red resistance band x 115 ft with steady resistance, 4-5 standing rest breaks due to fatigue in R quads > HS muscles Gait with red resistance band x 115 ft with alt L/R pull at pelvis to resist anterior rotation, pt noted to have increased pelvic rotation with use of resistance band Gait with red resistance band x 755 ft going BACKWARDS with steady resistance, initially with improved gait mechanics but onset of R knee pain - deferred further distance  To work on core strengthening/stability: Gait x 115 ft carrying crate with 10# weight, several standing rest breaks, difficulty due to the walking part; decreased pelvic rotation noted while carrying weight Attempted to carry unweighted crate, step to gait pattern, feels unsteady, asks to stop working on walking  To work on proximal stabilization: In half kneel on red mat on floor Alt L/R 2# dowel chest press 1 x 15 reps B Improved balance on LLE as compared to RLE Tends to extend her trunk with RLE down  Pt then has onset of a headache along with symptoms of sweating and fatigue to the point she feels like she is going to pass out. Vitals assessed (see below) and WNL for patient. Elected to end session early due to fatigue levels and onset of headache. Will assess response again next visit.  ***   There were no vitals filed for this visit.        PATIENT EDUCATION: Education details: continue HEP, go home and rest and drink water*** Education method: Explanation and Demonstration Education comprehension: verbalized understanding, returned demonstration, and needs further education  HOME EXERCISE PROGRAM: -sit at dinning table for 1 meal with upright posture picturing  self in mirror  Start with trying to sit upright x 15 sec without changing position, add 15 sec as this gets easier -work on sit to stands in front of full-length mirror at home, keep eyes open Add in counting up by 1's as this gets easier  -sidelying quad stretch 3 x 30 sec each B (verbally added 6/30, pt declines handout)  Access Code: [email or web address] URL: [email or web address] Date: 08/17/2023 Prepared by: Waddell Southgate  Exercises - Supine Bridge  - 1 x daily - 7 x weekly - 3 sets - 10 reps - Supine Quadricep Sets  - 1 x daily - 7 x weekly - 3 sets - 10 reps - Seated Hip Adduction Isometrics with Ball  - 1 x daily - 7 x weekly - 3 sets - 10 reps - 5 sec hold - Sidelying ITB Stretch off Table  - 1 x daily - 7 x weekly - 3 sets - 30s hold - Supine ITB Stretch with Strap  - 1 x daily - 7 x weekly - 3 sets - 30s hold - Supine 90/90 Alternating Toe Touch  - 1 x daily - 7 x weekly - 3 sets - 10 reps - Tall Kneeling Posterior Pelvic Tilt  - 1 x daily - 7 x weekly - 3 sets - 10 reps   GOALS: Goals reviewed with patient? Yes   NEW SHORT TERM GOALS:   Target date: 01/30/2024   Pt will report having a referral to neuropsychology to address her FND Baseline: will discuss referral to neuropsych with [facility] on 11/25, has referral and scheduled to see neuropsych in Jan 2026 (12/18) Goal status: MET   NEW LONG TERM GOALS:  Target date: 02/27/2024   Pt  will report working with neuropsychology on a consistent basis to address her FND Baseline: will discuss referral to neuropsych with [facility] on 11/25 Goal status: INITIAL  2.  Pt will be independent with final land and aquatic HEPs for improved strength, balance, transfers and gait. Baseline: in progress Goal status: IN PROGRESS  3.  Pt will improve gait speed to >/= 1.0 m/s to demonstrate improved community ambulation  Baseline: .72m/s, 0.84 m/s  Goal status: NEW      ASSESSMENT:  CLINICAL  IMPRESSION: Emphasis of skilled PT session on*** working on pelvic stabilization, core strengthening/stabilization, and proximal strengthening. Pt with significant difficulty performing ambulation this visit and quickly fatigues with this. Pt also with onset of a headache towards end of session. Her BP remains elevated but this is consistent with previous readings for her. Elected to end session early due to severe fatigue levels and onset of headache. Will assess response again next visit as well. She continues to benefit from skilled PT services in attempts to normalize her movement patterns in the context of FND diagnosis. Continue POC.     OBJECTIVE IMPAIRMENTS: Abnormal gait, cardiopulmonary status limiting activity, decreased activity tolerance, decreased balance, decreased coordination, decreased endurance, decreased knowledge of condition, decreased knowledge of use of DME, decreased mobility, difficulty walking, impaired perceived functional ability, impaired sensation, improper body mechanics, and postural dysfunction.   ACTIVITY LIMITATIONS: carrying, lifting, bending, sitting, standing, squatting, sleeping, stairs, transfers, and bed mobility  PARTICIPATION LIMITATIONS: meal prep, cleaning, laundry, interpersonal relationship, driving, shopping, community activity, and occupation  PERSONAL FACTORS: Time since onset of injury/illness/exacerbation and 1-2 comorbidities: anxiety and focal dystonia are also affecting patient's functional outcome.   REHAB POTENTIAL: Good  CLINICAL DECISION MAKING: Evolving/moderate complexity  EVALUATION COMPLEXITY: High  PLAN:  PT FREQUENCY: 2x/week 2x a week (re-cert)  PT DURATION: 6 weeks 8 weeks (re-cert) + 8 weeks (recert)  PLANNED INTERVENTIONS: 02835- PT Re-evaluation, 97750- Physical Performance Testing, 97110-Therapeutic exercises, 97530- Therapeutic activity, 97112- Neuromuscular re-education, 97535- Self Care, 02859- Manual therapy, [phone]-  Gait training, [phone]- Aquatic Therapy, [phone]- Electrical stimulation (manual), [phone] (1-2 muscles), 20561 (3+ muscles)- Dry Needling, Patient/Family education, Balance training, Stair training, Taping, Joint mobilization, Spinal mobilization, DME instructions, Cryotherapy, and Moist heat  PLAN FOR NEXT SESSION: how did she feel after visit 12/29? quadruped and tall kneel to work on proximal stability, hip abd strength, work on core strengthening, carrying a distracting object, QL and hip stretching to address LLD, pelvic mobility with resistance band with gait or in tall or half kneel, lifting weighted boxes, seated on dynadisc***  Scheduled through end of Jan, will reassess at that point   Aquatic: needs to work on progressing toward normalizing movement, needs to work on core control, but also lumbo-pelvic dissociation/ rotational movements  Waddell Southgate, PT, DPT, CSRS  02/08/2024, 7:50 AM       "

## 2024-02-14 ENCOUNTER — Ambulatory Visit: Payer: Self-pay | Attending: Internal Medicine | Admitting: Rehabilitation

## 2024-02-14 ENCOUNTER — Encounter: Payer: Self-pay | Admitting: Rehabilitation

## 2024-02-14 DIAGNOSIS — R258 Other abnormal involuntary movements: Secondary | ICD-10-CM | POA: Insufficient documentation

## 2024-02-14 DIAGNOSIS — R2689 Other abnormalities of gait and mobility: Secondary | ICD-10-CM | POA: Diagnosis present

## 2024-02-14 DIAGNOSIS — R2681 Unsteadiness on feet: Secondary | ICD-10-CM | POA: Diagnosis present

## 2024-02-14 NOTE — Therapy (Signed)
 " OUTPATIENT PHYSICAL THERAPY NEURO TREATMENT    Patient Name: Lauren Levy MRN: [id number] DOB:[birth year], 54 y.o., female Today's Date: 02/14/2024   PCP: Vernon Velna SAUNDERS, MD REFERRING PROVIDER: Hans Cinderella Duck, MD/ recert sent to PCP  END OF SESSION:  PT End of Session - 02/14/24 1039     Visit Number 36    Number of Visits 44    Date for Recertification  02/27/24    Authorization Type Aetna    Authorization Time Period VL:MN    PT Start Time 1148    PT Stop Time 1226    PT Time Calculation (min) 38 min    Equipment Utilized During Treatment Gait belt    Activity Tolerance Patient limited by fatigue;Other (comment)   onset of headache   Behavior During Therapy WFL for tasks assessed/performed                Past Medical History:  Diagnosis Date   Ovarian cyst    Past Surgical History:  Procedure Laterality Date   BREAST BIOPSY Right 02/16/2021   papilloma   BREAST LUMPECTOMY Right 04/14/2021   BREAST LUMPECTOMY WITH RADIOACTIVE SEED LOCALIZATION Right 04/14/2021   Procedure: RIGHT BREAST LUMPECTOMY WITH RADIOACTIVE SEED LOCALIZATION;  Surgeon: Vanderbilt Ned, MD;  Location: [facility];  Service: General;  Laterality: Right;   KNEE SURGERY Right    MINOR CARPAL TUNNEL Right    Patient Active Problem List   Diagnosis Date Noted   Anxiety 01/12/2022   Foot pain, right 01/12/2022   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   Dystonia 06/03/2021   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  Reports increased R knee pain from previous land session during resisted walking.    Pt accompanied by: self  PERTINENT HISTORY: PMH: anxiety, focal dystonia  PAIN:  Are you having pain? No  PRECAUTIONS: None  PATIENT GOALS: see if we can help me talk without closing my eyes, strengthen my back so that I can sit like normal people (slides out of chair when sitting normally), help with my walking  TREATMENT   Patient seen for aquatic therapy today.  Treatment took place in water 3.6-4.8 feet deep depending upon activity.  Pt entered and exited the pool via stairs using B rails for support.  Pool temp approx 92 deg.    Warm Up:  Walking forwards x approx 18' x 4 laps, backwards walking x 4 laps, all without support then  side stepping with yellow bar bells into and out of water with abd/add x 4 more laps.     Forward marching without support x 4 laps, adding in alt arm motions x 2 more laps without assist from PT today.  Note more trunk instability today with max cues for core engagement. Standing hip flex/ext (with knee ext) x 10 reps each side with light intermittent support from wall-cues for fingertip touch only.  Pt unable to do full set when in stance on RLE due to knee pain.   Performed ant/post weight shift from staggered position x 10 reps each side moving barbells together with ant weight shift and apart (scap retraction) when moving posteriorly.  Pt able to do this without increased knee pain for both sets.  Sit<>stand from 3rd step with feet on 1st  x 10 reps reaching forward, with min cues to not over extend trunk when fully upright.    Core strength:  Standing with feet narrowed holding yellow barbells, moving them from shoulder flex to ext with slow return to flex with  cues for avoiding locking knees out and decreasing trunk rotation.  Again, this caused increased knee pain, so modified to a tandem stance with LLE behind RLE and she is able to complete two more sets of 10 with last set doing shoulder flex and abd.  Seated on yellow pool noodle saddle style bicycling legs (using UEs as well) across pool x 4 laps.  Pt with increased fatigue and starting to have increased knee pain at end of 4th rep.        Pt requires buoyancy of water for support for reduced fall risk and for unloading/reduced stress on joints as pt able to tolerate increased standing and ambulation in water compared to that on land; viscosity of water is needed for resistance for strengthening and current of water provides perturbations for challenge for balance training       PATIENT EDUCATION: Education details: aquatic rationale  Education method: Programmer, Multimedia, Demonstration, and Handouts Education comprehension: verbalized understanding, returned demonstration, and needs further education  HOME EXERCISE PROGRAM: -sit at dinning table for 1 meal with upright posture picturing self in mirror  Start with trying to sit upright x 15 sec without changing position, add 15 sec as this gets easier -work on sit to stands in front of full-length mirror at home, keep eyes open Add in counting up by 1's as this gets easier  -sidelying quad stretch 3 x 30 sec each B (verbally added 6/30, pt declines handout)  Access Code: [email or web address] URL: [email or web address] Date: 08/17/2023 Prepared by: Waddell Southgate  Exercises - Supine Bridge  - 1 x daily - 7 x weekly - 3 sets - 10 reps - Supine Quadricep Sets  - 1 x daily - 7 x weekly - 3 sets - 10 reps - Seated Hip Adduction Isometrics with Ball  - 1 x daily - 7 x weekly - 3 sets - 10 reps - 5 sec hold - Sidelying ITB Stretch off Table  - 1 x daily - 7 x weekly - 3 sets - 30s hold - Supine  ITB Stretch with Strap  - 1 x daily - 7 x weekly -  3 sets - 30s hold - Supine 90/90 Alternating Toe Touch  - 1 x daily - 7 x weekly - 3 sets - 10 reps - Tall Kneeling Posterior Pelvic Tilt  - 1 x daily - 7 x weekly - 3 sets - 10 reps   GOALS: Goals reviewed with patient? 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  Pt will be independent with final land and aquatic HEPs for improved strength, balance, transfers and gait. Baseline: in progress Goal status: IN PROGRESS  3.  Pt will improve gait speed to >/= 1.0 m/s to demonstrate improved community ambulation  Baseline: .64m/s, 0.84 m/s  Goal status: NEW      ASSESSMENT:  CLINICAL IMPRESSION: Session limited today due to new onset of R knee pain.  Discussed that she had had a R knee scope about 2 years ago. Her pain seems to be arthritic in nature.  Educated about recovery process and that if pain isnt improving over the next several days, it may be worth getting seen at her ortho clinic for evaluation.  Pt verbalized understanding.      OBJECTIVE IMPAIRMENTS: Abnormal gait, cardiopulmonary status limiting activity, decreased activity tolerance, decreased balance, decreased coordination, decreased endurance, decreased knowledge of condition, decreased knowledge of use of DME, decreased mobility, difficulty walking, impaired perceived functional ability, impaired sensation, improper body mechanics, and postural dysfunction.   ACTIVITY LIMITATIONS: carrying, lifting, bending, sitting, standing, squatting, sleeping, stairs, transfers, and bed mobility  PARTICIPATION  LIMITATIONS: meal prep, cleaning, laundry, interpersonal relationship, driving, shopping, community activity, and occupation  PERSONAL FACTORS: Time since onset of injury/illness/exacerbation and 1-2 comorbidities: anxiety and focal dystonia are also affecting patient's functional outcome.   REHAB POTENTIAL: Good  CLINICAL DECISION MAKING: Evolving/moderate complexity  EVALUATION COMPLEXITY: High  PLAN:  PT FREQUENCY: 2x/week 2x a week (re-cert)  PT DURATION: 6 weeks 8 weeks (re-cert) + 8 weeks (recert)  PLANNED INTERVENTIONS: 02835- PT Re-evaluation, 97750- Physical Performance Testing, 97110-Therapeutic exercises, 97530- Therapeutic activity, [id number]- Neuromuscular re-education, 97535- Self Care, 02859- Manual therapy, [id number]- Gait training, [phone]- Aquatic Therapy, [phone]- Electrical stimulation (manual), [phone] (1-2 muscles), [phone] (3+ muscles)- Dry Needling, Patient/Family education, Balance training, Stair training, Taping, Joint mobilization, Spinal mobilization, DME instructions, Cryotherapy, and Moist heat  PLAN FOR NEXT SESSION: assess seated BP, quadruped and tall kneel to work on proximal stability, hip abd strength, work on core strengthening, resisted EVA walker? Lumbo-pelvic dissociation, carrying a distracting object, response to TPDN?, QL and hip stretching to address LLD, pelvic mobility with resistance band with gait or in tall or half kneel   Aquatic: needs to work on progressing toward normalizing movement, needs to work on core control, but also lumbo-pelvic dissociation/ rotational movements  Damien Fought, PT, MPT [facility] [address] [place], [place], [address] Phone: [phone]   Fax:  [phone] 02/14/2024, 12:48 PM       "

## 2024-02-16 ENCOUNTER — Ambulatory Visit: Payer: Self-pay | Admitting: Physical Therapy

## 2024-02-16 DIAGNOSIS — R258 Other abnormal involuntary movements: Secondary | ICD-10-CM

## 2024-02-16 DIAGNOSIS — R2681 Unsteadiness on feet: Secondary | ICD-10-CM | POA: Diagnosis not present

## 2024-02-16 DIAGNOSIS — R2689 Other abnormalities of gait and mobility: Secondary | ICD-10-CM

## 2024-02-16 NOTE — Therapy (Signed)
 " OUTPATIENT PHYSICAL THERAPY NEURO TREATMENT   Patient Name: Lauren Levy MRN: [id number] DOB:13-Mar-1970, 54 y.o., female Today's Date: 02/16/2024   PCP: Vernon Velna SAUNDERS, MD REFERRING PROVIDER: Hans Cinderella Duck, MD/ recert sent to PCP  END OF SESSION:  PT End of Session - 02/16/24 1016     Visit Number 37    Number of Visits 44    Date for Recertification  02/27/24    Authorization Type Aetna    Authorization Time Period VL:MN    PT Start Time 1015    PT Stop Time 1058    PT Time Calculation (min) 43 min    Equipment Utilized During Treatment Gait belt    Activity Tolerance Patient tolerated treatment well    Behavior During Therapy WFL for tasks assessed/performed                   Past Medical History:  Diagnosis Date   Ovarian cyst    Past Surgical History:  Procedure Laterality Date   BREAST BIOPSY Right 02/16/2021   papilloma   BREAST LUMPECTOMY Right 04/14/2021   BREAST LUMPECTOMY WITH RADIOACTIVE SEED LOCALIZATION Right 04/14/2021   Procedure: RIGHT BREAST LUMPECTOMY WITH RADIOACTIVE SEED LOCALIZATION;  Surgeon: Vanderbilt Ned, MD;  Location: [facility];  Service: General;  Laterality: Right;   KNEE SURGERY Right    MINOR CARPAL TUNNEL Right    Patient Active Problem List   Diagnosis Date Noted   Anxiety 01/12/2022   Foot pain, right 01/12/2022   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   Dystonia 06/03/2021   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   Gait disturbance 10/09/2020    ONSET DATE: 06/29/2023  REFERRING DIAG: R26.9 (ICD-10-CM) - Unspecified abnormalities of gait and mobility  THERAPY DIAG:  Unsteadiness on feet  Other abnormalities of gait and mobility  Other abnormal involuntary movements  Rationale for Evaluation and Treatment: Rehabilitation  SUBJECTIVE:                                                                                                                                                                                              SUBJECTIVE STATEMENT:  Pt reports some ongoing R knee pain (arthritis) that feels better today than last week but still hurts a bit. Pt reports otherwise things are going about the same.  She sees neuropsych on 1/14.  Pt accompanied by: self  PERTINENT HISTORY: PMH: anxiety, focal dystonia  PAIN:  Are you having pain? No  PRECAUTIONS: None  PATIENT GOALS: see if we can help me talk without closing my eyes, strengthen my back so that I can sit like normal  people (slides out of chair when sitting normally), help with my walking                                                                                                                               TREATMENT   NMR To work on core strengthening and stabilization:  Seated on blue Swiss ball: Anterior/posterior pelvic tilts x 10 reps each direction R/L lateral tilts x 10 reps each direction CCW circles 2 x 10 reps More difficulty during second set due to fatigue Good control during first set even though exercise is difficult CW circles 2 x 10 reps Seated TA contract with alt L/R marches x 10 reps B Seated TA contract with alt L/R LAQ x 10 reps B, more difficulty with this  Seated 12# KB deadlifts: 3 x 10 reps Seated 10# lateral KB deadlifts 2 x 10 reps B, more difficulty vs with forwards deadlifts Has to frequently readjust her hips More difficulty with R side vs L side  Seated on green dynadisc: 5# weighted dowel chest press x 8 reps, x 6 reps, x 10 reps, x 10 reps    PATIENT EDUCATION: Education details: continue HEP, can work on seated Swiss ball exercises at home - can provide handout next visit Education method: Medical Illustrator Education comprehension: verbalized understanding, returned demonstration, and needs further education  HOME EXERCISE PROGRAM: -sit at dinning table for 1 meal with upright posture picturing self in mirror  Start with trying to sit upright x 15  sec without changing position, add 15 sec as this gets easier -work on sit to stands in front of full-length mirror at home, keep eyes open Add in counting up by 1's as this gets easier  -sidelying quad stretch 3 x 30 sec each B (verbally added 6/30, pt declines handout)  Access Code: [email or web address] URL: [email or web address] Date: 08/17/2023 Prepared by: Waddell Southgate  Exercises - Supine Bridge  - 1 x daily - 7 x weekly - 3 sets - 10 reps - Supine Quadricep Sets  - 1 x daily - 7 x weekly - 3 sets - 10 reps - Seated Hip Adduction Isometrics with Ball  - 1 x daily - 7 x weekly - 3 sets - 10 reps - 5 sec hold - Sidelying ITB Stretch off Table  - 1 x daily - 7 x weekly - 3 sets - 30s hold - Supine ITB Stretch with Strap  - 1 x daily - 7 x weekly - 3 sets - 30s hold - Supine 90/90 Alternating Toe Touch  - 1 x daily - 7 x weekly - 3 sets - 10 reps - Tall Kneeling Posterior Pelvic Tilt  - 1 x daily - 7 x weekly - 3 sets - 10 reps  Verbally added 02/16/24: Seated in swiss ball: Ant/post pelvic tilts L/R lateral pelvic tilts CW/CCW circles   GOALS: Goals reviewed with patient? Yes   NEW SHORT TERM GOALS:  Target date: 01/30/2024   Pt will report having a referral to neuropsychology to address her FND Baseline: will discuss referral to neuropsych with [facility] on 11/25, has referral and scheduled to see neuropsych in Jan 2026 (12/18) Goal status: MET   NEW LONG TERM GOALS:  Target date: 02/27/2024   Pt will report working with neuropsychology on a consistent basis to address her FND Baseline: will discuss referral to neuropsych with [facility] on 11/25 Goal status: INITIAL  2.  Pt will be independent with final land and aquatic HEPs for improved strength, balance, transfers and gait. Baseline: in progress Goal status: IN PROGRESS  3.  Pt will improve gait speed to >/= 1.0 m/s to demonstrate improved community ambulation  Baseline: .60m/s, 0.84 m/s  Goal  status: NEW      ASSESSMENT:  CLINICAL IMPRESSION: Emphasis of skilled PT session on working on core strengthening/stabilization with use of Swiss ball, deadlifts, and seated on dynadisc. Pt initially does well with pelvic and core control on Swiss ball but does have more difficulty with stabilization with onset of fatigue. Additionally, she has more difficulty with lateral lean deadlifts vs seated forward dead lifts. Encouraged her to work on seated Swiss ball exercises at home as she reports she does have one available at home, can provide handout next visit if needed. She continues to benefit from skilled PT services in attempts to normalize her movement patterns in the context of FND diagnosis. Continue POC.     OBJECTIVE IMPAIRMENTS: Abnormal gait, cardiopulmonary status limiting activity, decreased activity tolerance, decreased balance, decreased coordination, decreased endurance, decreased knowledge of condition, decreased knowledge of use of DME, decreased mobility, difficulty walking, impaired perceived functional ability, impaired sensation, improper body mechanics, and postural dysfunction.   ACTIVITY LIMITATIONS: carrying, lifting, bending, sitting, standing, squatting, sleeping, stairs, transfers, and bed mobility  PARTICIPATION LIMITATIONS: meal prep, cleaning, laundry, interpersonal relationship, driving, shopping, community activity, and occupation  PERSONAL FACTORS: Time since onset of injury/illness/exacerbation and 1-2 comorbidities: anxiety and focal dystonia are also affecting patient's functional outcome.   REHAB POTENTIAL: Good  CLINICAL DECISION MAKING: Evolving/moderate complexity  EVALUATION COMPLEXITY: High  PLAN:  PT FREQUENCY: 2x/week 2x a week (re-cert)  PT DURATION: 6 weeks 8 weeks (re-cert) + 8 weeks (recert)  PLANNED INTERVENTIONS: 02835- PT Re-evaluation, 97750- Physical Performance Testing, 97110-Therapeutic exercises, 97530- Therapeutic activity,  97112- Neuromuscular re-education, 97535- Self Care, 02859- Manual therapy, [phone]- Gait training, [phone]- Aquatic Therapy, [phone]- Electrical stimulation (manual), [phone] (1-2 muscles), 20561 (3+ muscles)- Dry Needling, Patient/Family education, Balance training, Stair training, Taping, Joint mobilization, Spinal mobilization, DME instructions, Cryotherapy, and Moist heat  PLAN FOR NEXT SESSION: quadruped and tall kneel to work on proximal stability, hip abd strength, work on core strengthening, carrying a distracting object, QL and hip stretching to address LLD, pelvic mobility with resistance band with gait or in tall or half kneel, lifting weighted boxes, seated on dynadisc, seated on Swiss ball with dowel  Scheduled through end of Jan, will reassess at that point   Aquatic: needs to work on progressing toward normalizing movement, needs to work on core control, but also lumbo-pelvic dissociation/ rotational movements  Waddell Southgate, PT, DPT, CSRS  02/16/2024, 10:58 AM       "

## 2024-02-21 ENCOUNTER — Encounter: Payer: Self-pay | Admitting: Rehabilitation

## 2024-02-21 ENCOUNTER — Ambulatory Visit: Payer: Self-pay | Admitting: Rehabilitation

## 2024-02-21 NOTE — Therapy (Deleted)
 " OUTPATIENT PHYSICAL THERAPY NEURO TREATMENT    Patient Name: Lauren Levy MRN: [id number] DOB:1970-09-03, 54 y.o., female Today's Date: 02/21/2024   PCP: Vernon Velna SAUNDERS, MD REFERRING PROVIDER: Hans Cinderella Duck, MD/ recert sent to PCP  END OF SESSION:  PT End of Session - 02/21/24 0938     Visit Number 38    Number of Visits 44    Date for Recertification  02/27/24    Authorization Type [employer]    Authorization Time Period VL:MN    Equipment Utilized During Treatment Gait belt    Activity Tolerance Patient tolerated treatment well    Behavior During Therapy [facility] for tasks assessed/performed                Past Medical History:  Diagnosis Date   Ovarian cyst    Past Surgical History:  Procedure Laterality Date   BREAST BIOPSY Right 02/16/2021   papilloma   BREAST LUMPECTOMY Right 04/14/2021   BREAST LUMPECTOMY WITH RADIOACTIVE SEED LOCALIZATION Right 04/14/2021   Procedure: RIGHT BREAST LUMPECTOMY WITH RADIOACTIVE SEED LOCALIZATION;  Surgeon: Vanderbilt Ned, MD;  Location: [facility];  Service: General;  Laterality: Right;   KNEE SURGERY Right    MINOR CARPAL TUNNEL Right    Patient Active Problem List   Diagnosis Date Noted   Anxiety 01/12/2022   Foot pain, right 01/12/2022   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   Dystonia 06/03/2021   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   Gait disturbance 10/09/2020    ONSET DATE: 06/29/2023  REFERRING DIAG: R26.9 (ICD-10-CM) - Unspecified abnormalities of gait and mobility  THERAPY DIAG:  Unsteadiness on feet  Other abnormalities of gait and mobility  Other abnormal involuntary movements  Rationale for Evaluation and Treatment: Rehabilitation  SUBJECTIVE:                                                                                                                                                                                             SUBJECTIVE STATEMENT:    Pt accompanied by:  self  PERTINENT HISTORY: PMH: anxiety, focal dystonia  PAIN:  Are you having pain? No  PRECAUTIONS: None  PATIENT GOALS: see if we can help me talk without closing my eyes, strengthen my back so that I can sit like normal people (slides out of chair when sitting normally), help with my walking  TREATMENT   Patient seen for aquatic therapy today.  Treatment took place in water 3.6-4.8 feet deep depending upon activity.  Pt entered and exited the pool via stairs using B rails for support.  Pool temp approx 92 deg.    Warm Up:  Walking forwards x approx 18' x 4 laps, backwards walking x 4 laps, all without support then  side stepping with yellow bar bells into and out of water with abd/add x 4 more laps.     Forward marching without support x 4 laps, adding in alt arm motions x 2 more laps without assist from PT today.  Note more trunk instability today with max cues for core engagement. Standing hip flex/ext (with knee ext) x 10 reps each side with light intermittent support from wall-cues for fingertip touch only.  Pt unable to do full set when in stance on RLE due to knee pain.   Performed ant/post weight shift from staggered position x 10 reps each side moving barbells together with ant weight shift and apart (scap retraction) when moving posteriorly.  Pt able to do this without increased knee pain for both sets.  Sit<>stand from 3rd step with feet on 1st  x 10 reps reaching forward, with min cues to not over extend trunk when fully upright.    Core strength:  Standing with feet narrowed holding yellow barbells, moving them from shoulder flex to ext with slow return to flex with cues for avoiding locking knees out and decreasing trunk rotation.  Again, this caused increased knee pain, so modified to a tandem stance with LLE behind RLE and she is able to complete two  more sets of 10 with last set doing shoulder flex and abd.  Seated on yellow pool noodle saddle style bicycling legs (using UEs as well) across pool x 4 laps.  Pt with increased fatigue and starting to have increased knee pain at end of 4th rep.        Pt requires buoyancy of water for support for reduced fall risk and for unloading/reduced stress on joints as pt able to tolerate increased standing and ambulation in water compared to that on land; viscosity of water is needed for resistance for strengthening and current of water provides perturbations for challenge for balance training       PATIENT EDUCATION: Education details: aquatic rationale  Education method: Programmer, Multimedia, Demonstration, and Handouts Education comprehension: verbalized understanding, returned demonstration, and needs further education  HOME EXERCISE PROGRAM: -sit at dinning table for 1 meal with upright posture picturing self in mirror  Start with trying to sit upright x 15 sec without changing position, add 15 sec as this gets easier -work on sit to stands in front of full-length mirror at home, keep eyes open Add in counting up by 1's as this gets easier  -sidelying quad stretch 3 x 30 sec each B (verbally added 6/30, pt declines handout)  Access Code: [email or web address] URL: [email or web address] Date: 08/17/2023 Prepared by: Waddell Southgate  Exercises - Supine Bridge  - 1 x daily - 7 x weekly - 3 sets - 10 reps - Supine Quadricep Sets  - 1 x daily - 7 x weekly - 3 sets - 10 reps - Seated Hip Adduction Isometrics with Ball  - 1 x daily - 7 x weekly - 3 sets - 10 reps - 5 sec hold - Sidelying ITB Stretch off Table  - 1 x daily - 7 x weekly - 3 sets - 30s hold - Supine  ITB Stretch with Strap  - 1 x daily - 7 x weekly - 3 sets - 30s hold - Supine 90/90 Alternating Toe Touch  - 1 x daily - 7 x weekly - 3 sets - 10 reps - Tall Kneeling Posterior Pelvic Tilt  - 1 x daily - 7 x weekly - 3 sets - 10  reps   GOALS: Goals reviewed with patient? 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  Pt will be independent with final land and aquatic HEPs for improved strength, balance, transfers and gait. Baseline: in progress Goal status: IN PROGRESS  3.  Pt will improve gait speed to >/= 1.0 m/s to demonstrate improved community ambulation  Baseline: .63m/s, 0.84 m/s  Goal status: NEW      ASSESSMENT:  CLINICAL IMPRESSION:       OBJECTIVE IMPAIRMENTS: Abnormal gait, cardiopulmonary status limiting activity, decreased activity tolerance, decreased balance, decreased coordination, decreased endurance, decreased knowledge of condition, decreased knowledge of use of DME, decreased mobility, difficulty walking, impaired perceived functional ability, impaired sensation, improper body mechanics, and postural dysfunction.   ACTIVITY LIMITATIONS: carrying, lifting, bending, sitting, standing, squatting, sleeping, stairs, transfers, and bed mobility  PARTICIPATION LIMITATIONS: meal prep, cleaning, laundry, interpersonal relationship, driving, shopping, community activity, and occupation  PERSONAL FACTORS: Time since onset of injury/illness/exacerbation and 1-2 comorbidities: anxiety and focal dystonia are also affecting patient's functional outcome.   REHAB POTENTIAL: Good  CLINICAL DECISION MAKING: Evolving/moderate complexity  EVALUATION COMPLEXITY: High  PLAN:  PT FREQUENCY: 2x/week 2x a week (re-cert)  PT DURATION: 6 weeks 8 weeks (re-cert) + 8 weeks  (recert)  PLANNED INTERVENTIONS: 02835- PT Re-evaluation, 97750- Physical Performance Testing, 97110-Therapeutic exercises, 97530- Therapeutic activity, [id number]- Neuromuscular re-education, 97535- Self Care, 02859- Manual therapy, [id number]- Gait training, [phone]- Aquatic Therapy, [phone]- Electrical stimulation (manual), [phone] (1-2 muscles), [id number] (3+ muscles)- Dry Needling, Patient/Family education, Balance training, Stair training, Taping, Joint mobilization, Spinal mobilization, DME instructions, Cryotherapy, and Moist heat  PLAN FOR NEXT SESSION: assess seated BP, quadruped and tall kneel to work on proximal stability, hip abd strength, work on core strengthening, resisted EVA walker? Lumbo-pelvic dissociation, carrying a distracting object, response to TPDN?, QL and hip stretching to address LLD, pelvic mobility with resistance band with gait or in tall or half kneel   Aquatic: needs to work on progressing toward normalizing movement, needs to work on core control, but also lumbo-pelvic dissociation/ rotational movements  Damien Fought, PT, MPT [facility] [address] [place], [place], [address] Phone: [phone]   Fax:  [phone] 02/21/2024, 9:38 AM       "

## 2024-02-23 ENCOUNTER — Ambulatory Visit: Payer: Self-pay | Admitting: Physical Therapy

## 2024-02-23 DIAGNOSIS — R2689 Other abnormalities of gait and mobility: Secondary | ICD-10-CM

## 2024-02-23 DIAGNOSIS — R2681 Unsteadiness on feet: Secondary | ICD-10-CM | POA: Diagnosis not present

## 2024-02-23 NOTE — Therapy (Signed)
 " OUTPATIENT PHYSICAL THERAPY NEURO TREATMENT - RECERT***   Patient Name: Lauren Levy MRN: [id number] DOB:1970/07/01, 54 y.o., female Today's Date: 02/23/2024   PCP: Vernon Velna SAUNDERS, MD REFERRING PROVIDER: Hans Cinderella Duck, MD/ recert sent to PCP  END OF SESSION:  PT End of Session - 02/23/24 1159     Visit Number 38    Number of Visits 50   recert   Date for Recertification  04/19/24   recert, to allow for scheduling delays   Authorization Type [employer] [id number]    Authorization Time Period VL:30 (PT,OT), $50 copay    PT Start Time 1158   pt arrived late   PT Stop Time 1230    PT Time Calculation (min) 32 min    Equipment Utilized During Treatment Gait belt    Activity Tolerance Patient tolerated treatment well    Behavior During Therapy WFL for tasks assessed/performed                    Past Medical History:  Diagnosis Date   Ovarian cyst    Past Surgical History:  Procedure Laterality Date   BREAST BIOPSY Right 02/16/2021   papilloma   BREAST LUMPECTOMY Right 04/14/2021   BREAST LUMPECTOMY WITH RADIOACTIVE SEED LOCALIZATION Right 04/14/2021   Procedure: RIGHT BREAST LUMPECTOMY WITH RADIOACTIVE SEED LOCALIZATION;  Surgeon: Vanderbilt Ned, MD;  Location: [facility];  Service: General;  Laterality: Right;   KNEE SURGERY Right    MINOR CARPAL TUNNEL Right    Patient Active Problem List   Diagnosis Date Noted   Anxiety 01/12/2022   Foot pain, right 01/12/2022   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   Dystonia 06/03/2021   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  Pt was unable to attend aquatic therapy earlier this week, she didn't have transportation. Pt was unable to receive neuropsych services for her scheduled 1/14 appointment because they were out of network with her insurance. 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  Her arthritic R knee pain remains limiting, still gets R quad pain with walking and any exercise. She alternates a cold pack/hot pack on her knee at home.  Pt accompanied by: self  PERTINENT HISTORY: PMH: anxiety, focal dystonia  PAIN:  Are you having pain? No  PRECAUTIONS: None  PATIENT GOALS: see if we can help me talk without closing my eyes, strengthen my back so that I can sit like normal people (slides out of chair when sitting normally), help with my walking                                                                                                                               TREATMENT   TherAct SciFit multi-peaks level 6 for 8 minutes using BUE/BLEs for neural priming for reciprocal movement, dynamic cardiovascular warmup and increased amplitude of stepping. RPE of 10/10 following activity.    THE PATIENT SPECIFIC FUNCTIONAL SCALE  Place score of 0-10 (0 = unable to perform activity and 10 = able to perform activity at the same level as before injury or problem)  Activity Date: 02/23/24    Walking 4    2.  Speaking with eyes open 2    3.  Stairs 6    4.  Standing for extended period of time (cooking) 4    Total Score 16/4=4      Total Score = Sum of activity scores/number of activities  Minimally Detectable Change: 3 points (for single activity); 2 points (for average score)  [facility] (nd). The Patient  Specific Functional Scale . Retrieved from [email or web address]    Physical Performance For LTG assessment:  [facility] PT Assessment - 02/23/24 1212       Ambulation/Gait   Gait velocity 32.8 ft over 13.4 sec = 2.45 ft/sec   no AD            PATIENT EDUCATION: Education details: continue HEP, PT POC with plan to add visits until end of Feb to continue at least until she starts neuropsych treatment Education method: Explanation and Demonstration Education comprehension: verbalized understanding, returned demonstration, and needs further education  HOME EXERCISE PROGRAM: -sit at dinning table for 1 meal with upright posture picturing self in mirror  Start with trying to sit upright x 15 sec without changing position, add 15 sec as this gets easier -work on sit to stands in front of full-length mirror at home, keep eyes open Add in counting up by 1's as this gets easier  -sidelying quad stretch 3 x 30 sec each B (verbally added 6/30, pt declines handout)  Access Code: [email or web address] URL: [email or web address] Date: 08/17/2023 Prepared by: Waddell Southgate  Exercises - Supine Bridge  - 1 x daily - 7 x weekly - 3 sets - 10 reps - Supine Quadricep Sets  - 1 x daily - 7 x weekly - 3 sets - 10 reps - Seated  Hip Adduction Isometrics with Ball  - 1 x daily - 7 x weekly - 3 sets - 10 reps - 5 sec hold - Sidelying ITB Stretch off Table  - 1 x daily - 7 x weekly - 3 sets - 30s hold - Supine ITB Stretch with Strap  - 1 x daily - 7 x weekly - 3 sets - 30s hold - Supine 90/90 Alternating Toe Touch  - 1 x daily - 7 x weekly - 3 sets - 10 reps - Tall Kneeling Posterior Pelvic Tilt  - 1 x daily - 7 x weekly - 3 sets - 10 reps  Verbally added 02/16/24: Seated in swiss ball: Ant/post pelvic tilts L/R lateral pelvic tilts CW/CCW circles   GOALS: Goals reviewed with patient? Yes   NEW SHORT TERM GOALS:   Target date:  01/30/2024   Pt will report having a referral to neuropsychology to address her FND Baseline: will discuss referral to neuropsych with [facility] on 11/25, has referral and scheduled to see neuropsych in Jan 2026 (12/18) Goal status: MET   NEW LONG TERM GOALS:  Target date: 02/27/2024   Pt will report working with neuropsychology on a consistent basis to address her FND Baseline: will discuss referral to neuropsych with [facility] on 11/25, appt moved from 1/15 to 2/13 due to insurance Goal status: IN PROGRESS  2.  Pt will be independent with final land and aquatic HEPs for improved strength, balance, transfers and gait. Baseline: in progress Goal status: IN PROGRESS  3.  Pt will improve gait speed to >/= 1.0 m/s to demonstrate improved community ambulation  Baseline: .39m/s, 0.84 m/s, 0.75 m/s (1/15)  Goal status: NOT MET   NEW NEW SHORT TERM GOALS:   Target date: 03/23/2024   Pt will complete her initial neuropsych visit for interdisciplinary treatment of her FND. Baseline: 1st appt now 03/23/24 Goal status: INITIAL    NEW NEW LONG TERM GOALS:  Target date: 04/19/2024   Pt will report working with neuropsychology on a consistent basis to address her FND Baseline: will discuss referral to neuropsych with [facility] on 11/25, appt moved from 1/15 to 2/13 due to insurance Goal status: IN PROGRESS  Pt will be independent with final land and aquatic HEPs for improved strength, balance, transfers and gait. Baseline: in progress Goal status: IN PROGRESS  Pt will improve gait speed to >/= 1.0 m/s to demonstrate improved community ambulation Baseline: .35m/s, 0.84 m/s, 0.75 m/s (1/15) Goal status: IN PROGRESS  4.  Pt will improve her score on the PSFS by >/= 2 points to demonstrate improved function and return to meaningful activities. Baseline: 4 (1/15) Goal status: INITIAL    ASSESSMENT:  CLINICAL IMPRESSION: Session limited by patient's late arrival.  Emphasis of skilled PT session on working on assessing LTG and writing new STG/LTG for recertification of PT services. Pt is making progress towards 2/3 LTG and did not meet 1/3 LTG. She is making progress towards her final land and aquatic HEPs but POC has been extended so HEPs have not yet been finalized. Additionally, she is making progress towards attending neuropsych***  Decided to schedule patient through end of February to have visits beyond her neuropsych appt to discuss POC from there.   core strengthening/stabilization with use of Swiss ball, deadlifts, and seated on dynadisc. Pt initially does well with pelvic and core control on Swiss ball but does have more difficulty with stabilization with onset of fatigue. Additionally, she has more difficulty with  lateral lean deadlifts vs seated forward dead lifts. Encouraged her to work on seated Swiss ball exercises at home as she reports she does have one available at home, can provide handout next visit if needed. She continues to benefit from skilled PT services in attempts to normalize her movement patterns in the context of FND diagnosis. Continue POC.     OBJECTIVE IMPAIRMENTS: Abnormal gait, cardiopulmonary status limiting activity, decreased activity tolerance, decreased balance, decreased coordination, decreased endurance, decreased knowledge of condition, decreased knowledge of use of DME, decreased mobility, difficulty walking, impaired perceived functional ability, impaired sensation, improper body mechanics, and postural dysfunction.   ACTIVITY LIMITATIONS: carrying, lifting, bending, sitting, standing, squatting, sleeping, stairs, transfers, and bed mobility  PARTICIPATION LIMITATIONS: meal prep, cleaning, laundry, interpersonal relationship, driving, shopping, community activity, and occupation  PERSONAL FACTORS: Time since onset of injury/illness/exacerbation and 1-2 comorbidities: anxiety and focal dystonia are also affecting  patient's functional outcome.   REHAB POTENTIAL: Good  CLINICAL DECISION MAKING: Evolving/moderate complexity  EVALUATION COMPLEXITY: High  PLAN:  PT FREQUENCY: 2x/week 2x a week (re-cert) ***  PT DURATION: 6 weeks 8 weeks (re-cert) + 8 weeks (recert) ***  PLANNED INTERVENTIONS: 02835- PT Re-evaluation, 97750- Physical Performance Testing, 97110-Therapeutic exercises, 97530- Therapeutic activity, 97112- Neuromuscular re-education, 97535- Self Care, 02859- Manual therapy, [phone]- Gait training, [phone]- Aquatic Therapy, [phone]- Electrical stimulation (manual), [phone] (1-2 muscles), 20561 (3+ muscles)- Dry Needling, Patient/Family education, Balance training, Stair training, Taping, Joint mobilization, Spinal mobilization, DME instructions, Cryotherapy, and Moist heat  PLAN FOR NEXT SESSION: quadruped and tall kneel to work on proximal stability, hip abd strength, work on core strengthening, carrying a distracting object, QL and hip stretching to address LLD, pelvic mobility with resistance band with gait or in tall or half kneel, lifting weighted boxes, seated on dynadisc, seated on Swiss ball with dowel, focus on specific R knee strengthening exercises  Scheduled through end of Feb, will reassess at that point***  Request updated insurance auth***   Aquatic: needs to work on progressing toward normalizing movement, needs to work on core control, but also lumbo-pelvic dissociation/ rotational movements  Waddell Southgate, PT, DPT, CSRS  02/23/24, 3:16 PM       "

## 2024-02-28 ENCOUNTER — Ambulatory Visit: Payer: Self-pay | Admitting: Rehabilitation

## 2024-03-01 ENCOUNTER — Ambulatory Visit: Payer: Self-pay | Admitting: Physical Therapy

## 2024-03-06 ENCOUNTER — Ambulatory Visit: Payer: Self-pay | Admitting: Rehabilitation

## 2024-03-08 ENCOUNTER — Ambulatory Visit: Payer: Self-pay | Admitting: Physical Therapy

## 2024-03-08 DIAGNOSIS — R2681 Unsteadiness on feet: Secondary | ICD-10-CM | POA: Diagnosis not present

## 2024-03-08 DIAGNOSIS — R2689 Other abnormalities of gait and mobility: Secondary | ICD-10-CM

## 2024-03-08 NOTE — Therapy (Signed)
 " OUTPATIENT PHYSICAL THERAPY NEURO TREATMENT   Patient Name: Lauren Levy MRN: [id number] DOB:1970/10/31, 54 y.o., female Today's Date: 03/08/2024   PCP: Vernon Velna SAUNDERS, MD REFERRING PROVIDER: Hans Cinderella Duck, MD/ recert sent to PCP  END OF SESSION:  PT End of Session - 03/08/24 1151     Visit Number 39    Number of Visits 50   recert   Date for Recertification  04/19/24   recert, to allow for scheduling delays   Authorization Type [id number]    Authorization Time Period 6 PT visits 1/22-3/22    Authorization - Visit Number 1    Authorization - Number of Visits 6    Progress Note Due on Visit 40    PT Start Time 1150    PT Stop Time 1230    PT Time Calculation (min) 40 min    Equipment Utilized During Treatment Gait belt    Activity Tolerance Patient tolerated treatment well    Behavior During Therapy WFL for tasks assessed/performed                     Past Medical History:  Diagnosis Date   Ovarian cyst    Past Surgical History:  Procedure Laterality Date   BREAST BIOPSY Right 02/16/2021   papilloma   BREAST LUMPECTOMY Right 04/14/2021   BREAST LUMPECTOMY WITH RADIOACTIVE SEED LOCALIZATION Right 04/14/2021   Procedure: RIGHT BREAST LUMPECTOMY WITH RADIOACTIVE SEED LOCALIZATION;  Surgeon: Vanderbilt Ned, MD;  Location: [facility];  Service: General;  Laterality: Right;   KNEE SURGERY Right    MINOR CARPAL TUNNEL Right    Patient Active Problem List   Diagnosis Date Noted   Anxiety 01/12/2022   Foot pain, right 01/12/2022   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 She is getting frustrated with her lack of progress and frequent setbacks. Encouraged by having neuropsych appointment in a couple weeks, hoping they can tell [facility] physicians to image her brain to rule out anything else that might  be going on.  She continues to use her knee scooter, walking without it aggravates her knee pain.  Pt accompanied by: self  PERTINENT HISTORY: PMH: anxiety, focal dystonia  PAIN:  Are you having pain? No  PRECAUTIONS: None  PATIENT GOALS: see if we can help me talk without closing my eyes, strengthen my back so that I can sit like normal people (slides out of chair when sitting normally), help with my walking                                                                                                                               TREATMENT   TherAct To work on functional strengthening and core stabilization: Sit to stands x 10 reps with 10# weighted vest + resistance band around hips 2 x 10 reps Pt tends to lose her balance to the R, lands on R hip with trunk rotated to the R, comfort position In // bars with intermittent UE support; Alt L/R 4 eccentric step downs 2 x 10 reps Alt L/R 6 forwards step ups 2 x 10 reps Focus on keeping eyes open and watching herself in the mirror to see control over body Removed 10# weighted vest for second set Forward/backward stepping over 4 foam beam with hurdles on the side to prevent circumduction X 10 reps B With progression to gait down length of // bars, 6 x 10 ft Pt exhibits good control of limbs during this short distance gait but it is very effortful due to fighting tightness in muscles in her R side     PATIENT EDUCATION: Education details: continue HEP, continue  working on walking short distances at home with focus on leg placement and good control of limbs Education method: Medical Illustrator Education comprehension: verbalized understanding, returned demonstration, and needs further education  HOME EXERCISE PROGRAM: -sit at dinning table for 1 meal with upright posture picturing self in mirror  Start with trying to sit upright x 15 sec without changing position, add 15 sec as this gets easier -work on sit to stands in front of full-length mirror at home, keep eyes open Add in counting up by 1's as this gets easier  -sidelying quad stretch 3 x 30 sec each B (verbally added 6/30, pt declines handout)  Access Code: [email or web address] URL: [email or web address] Date: 08/17/2023 Prepared by: Waddell Southgate  Exercises - Supine Bridge  - 1 x daily - 7 x weekly - 3 sets - 10 reps - Supine Quadricep Sets  - 1 x daily - 7 x weekly - 3 sets - 10 reps - Seated Hip Adduction Isometrics with Ball  - 1 x daily - 7 x weekly - 3 sets - 10 reps - 5 sec hold - Sidelying ITB Stretch off Table  - 1 x daily - 7 x weekly - 3 sets - 30s hold - Supine ITB Stretch with Strap  - 1 x daily - 7 x weekly - 3  sets - 30s hold - Supine 90/90 Alternating Toe Touch  - 1 x daily - 7 x weekly - 3 sets - 10 reps - Tall Kneeling Posterior Pelvic Tilt  - 1 x daily - 7 x weekly - 3 sets - 10 reps  Verbally added 02/16/24: Seated in swiss ball: Ant/post pelvic tilts L/R lateral pelvic tilts CW/CCW circles   GOALS: Goals reviewed with patient? Yes   NEW SHORT TERM GOALS:   Target date: 01/30/2024   Pt will report having a referral to neuropsychology to address her FND Baseline: will discuss referral to neuropsych with [facility] on 11/25, has referral and scheduled to see neuropsych in Jan 2026 (12/18) Goal status: MET   NEW LONG TERM GOALS:  Target date: 02/27/2024   Pt will report working with neuropsychology on a consistent basis to address her  FND Baseline: will discuss referral to neuropsych with [facility] on 11/25, appt moved from 1/15 to 2/13 due to insurance Goal status: IN PROGRESS  2.  Pt will be independent with final land and aquatic HEPs for improved strength, balance, transfers and gait. Baseline: in progress Goal status: IN PROGRESS  3.  Pt will improve gait speed to >/= 1.0 m/s to demonstrate improved community ambulation  Baseline: .39m/s, 0.84 m/s, 0.75 m/s (1/15)  Goal status: NOT MET   NEW NEW SHORT TERM GOALS:   Target date: 03/23/2024   Pt will complete her initial neuropsych visit for interdisciplinary treatment of her FND. Baseline: 1st appt now 03/23/24 Goal status: INITIAL    NEW NEW LONG TERM GOALS:  Target date: 04/19/2024   Pt will report working with neuropsychology on a consistent basis to address her FND Baseline: will discuss referral to neuropsych with [facility] on 11/25, appt moved from 1/15 to 2/13 due to insurance Goal status: IN PROGRESS  Pt will be independent with final land and aquatic HEPs for improved strength, balance, transfers and gait. Baseline: in progress Goal status: IN PROGRESS  Pt will improve gait speed to >/= 1.0 m/s to demonstrate improved community ambulation Baseline: .12m/s, 0.84 m/s, 0.75 m/s (1/15) Goal status: IN PROGRESS  4.  Pt will improve her score on the PSFS by >/= 2 points to demonstrate improved function and return to meaningful activities. Baseline: 4 (1/15) Goal status: INITIAL    ASSESSMENT:  CLINICAL IMPRESSION: Emphasis of skilled PT session on continuing to work on improved control of body movements with addition of weighted vest this session. Pt does exhibit improved control with sit to stands with weighted vest but decreased control during gait and asks to remove vest eventually as it is making her hot. She also exhibits improved BLE control with use of mirror for visual feedback. Fair carryover to gait outside of // bars with focus  on limb control. Encouraged her to continue working on this at home. She continues to benefit from skilled PT services in attempts to normalize her movement patterns in the context of FND diagnosis. Continue POC.      OBJECTIVE IMPAIRMENTS: Abnormal gait, cardiopulmonary status limiting activity, decreased activity tolerance, decreased balance, decreased coordination, decreased endurance, decreased knowledge of condition, decreased knowledge of use of DME, decreased mobility, difficulty walking, impaired perceived functional ability, impaired sensation, improper body mechanics, and postural dysfunction.   ACTIVITY LIMITATIONS: carrying, lifting, bending, sitting, standing, squatting, sleeping, stairs, transfers, and bed mobility  PARTICIPATION LIMITATIONS: meal prep, cleaning, laundry, interpersonal relationship, driving, shopping, community activity, and occupation  PERSONAL FACTORS: Time since onset of  injury/illness/exacerbation and 1-2 comorbidities: anxiety and focal dystonia are also affecting patient's functional outcome.   REHAB POTENTIAL: Good  CLINICAL DECISION MAKING: Evolving/moderate complexity  EVALUATION COMPLEXITY: High  PLAN:  PT FREQUENCY: 2x/week 2x a week (re-cert) 8-7k/tzzx (recert)  PT DURATION: 6 weeks 8 weeks (re-cert) + 8 weeks (recert) +8 weeks (recert)  PLANNED INTERVENTIONS: 02835- PT Re-evaluation, 97750- Physical Performance Testing, 97110-Therapeutic exercises, 97530- Therapeutic activity, 97112- Neuromuscular re-education, 97535- Self Care, 02859- Manual therapy, [id number]- Gait training, [phone]- Aquatic Therapy, [phone]- Electrical stimulation (manual), [phone] (1-2 muscles), [phone] (3+ muscles)- Dry Needling, Patient/Family education, Balance training, Stair training, Taping, Joint mobilization, Spinal mobilization, DME instructions, Cryotherapy, and Moist heat  PLAN FOR NEXT SESSION: 40th PN, quadruped and tall kneel to work on proximal stability, hip abd  strength, work on core strengthening, carrying a distracting object, QL and hip stretching to address LLD, pelvic mobility with resistance band with gait or in tall or half kneel, lifting weighted boxes, seated on dynadisc, seated on Swiss ball with dowel, focus on specific R knee strengthening exercises  Scheduled through end of Feb, will reassess at that point     Aquatic: needs to work on progressing toward normalizing movement, needs to work on core control, but also lumbo-pelvic dissociation/ rotational movements  Waddell Southgate, PT, DPT, CSRS  03/08/24, 12:32 PM       "

## 2024-03-13 ENCOUNTER — Ambulatory Visit: Admitting: Rehabilitation

## 2024-03-22 ENCOUNTER — Ambulatory Visit: Admitting: Physical Therapy

## 2024-03-27 ENCOUNTER — Ambulatory Visit: Admitting: Rehabilitation

## 2024-04-03 ENCOUNTER — Ambulatory Visit: Admitting: Physical Therapy
